# Patient Record
Sex: Male | Born: 1950
Health system: Southern US, Community
[De-identification: ages and names within clinical notes are randomized; demographics above are authoritative.]

## PROBLEM LIST (undated history)

## (undated) DIAGNOSIS — Z125 Encounter for screening for malignant neoplasm of prostate: Secondary | ICD-10-CM

## (undated) DIAGNOSIS — M7989 Other specified soft tissue disorders: Secondary | ICD-10-CM

## (undated) DIAGNOSIS — I358 Other nonrheumatic aortic valve disorders: Secondary | ICD-10-CM

## (undated) DIAGNOSIS — E7800 Pure hypercholesterolemia, unspecified: Secondary | ICD-10-CM

## (undated) DIAGNOSIS — R972 Elevated prostate specific antigen [PSA]: Secondary | ICD-10-CM

## (undated) DIAGNOSIS — I350 Nonrheumatic aortic (valve) stenosis: Secondary | ICD-10-CM

## (undated) DIAGNOSIS — M549 Dorsalgia, unspecified: Secondary | ICD-10-CM

## (undated) DIAGNOSIS — M159 Polyosteoarthritis, unspecified: Secondary | ICD-10-CM

## (undated) DIAGNOSIS — Z9989 Dependence on other enabling machines and devices: Secondary | ICD-10-CM

## (undated) DIAGNOSIS — R011 Cardiac murmur, unspecified: Secondary | ICD-10-CM

## (undated) DIAGNOSIS — K219 Gastro-esophageal reflux disease without esophagitis: Secondary | ICD-10-CM

## (undated) DIAGNOSIS — M51369 Other intervertebral disc degeneration, lumbar region without mention of lumbar back pain or lower extremity pain: Secondary | ICD-10-CM

## (undated) DIAGNOSIS — I10 Essential (primary) hypertension: Secondary | ICD-10-CM

## (undated) DIAGNOSIS — I639 Cerebral infarction, unspecified: Secondary | ICD-10-CM

## (undated) DIAGNOSIS — Z85820 Personal history of malignant melanoma of skin: Secondary | ICD-10-CM

## (undated) DIAGNOSIS — E785 Hyperlipidemia, unspecified: Secondary | ICD-10-CM

## (undated) DIAGNOSIS — C439 Malignant melanoma of skin, unspecified: Secondary | ICD-10-CM

## (undated) DIAGNOSIS — T4145XA Adverse effect of unspecified anesthetic, initial encounter: Secondary | ICD-10-CM

## (undated) DIAGNOSIS — G8929 Other chronic pain: Secondary | ICD-10-CM

## (undated) DIAGNOSIS — M10071 Idiopathic gout, right ankle and foot: Secondary | ICD-10-CM

## (undated) DIAGNOSIS — M109 Gout, unspecified: Secondary | ICD-10-CM

## (undated) DIAGNOSIS — M5136 Other intervertebral disc degeneration, lumbar region: Secondary | ICD-10-CM

## (undated) DIAGNOSIS — T8859XA Other complications of anesthesia, initial encounter: Secondary | ICD-10-CM

## (undated) DIAGNOSIS — M150 Primary generalized (osteo)arthritis: Secondary | ICD-10-CM

## (undated) DIAGNOSIS — M1990 Unspecified osteoarthritis, unspecified site: Secondary | ICD-10-CM

## (undated) HISTORY — PX: MELANOMA EXCISION: SHX5266

## (undated) HISTORY — PX: TONSILLECTOMY: SUR1361

## (undated) HISTORY — DX: Encounter for screening for malignant neoplasm of prostate: Z12.5

## (undated) HISTORY — PX: COLONOSCOPY: SHX174

## (undated) HISTORY — PX: SPINAL CORD STIMULATOR IMPLANT: SHX2422

## (undated) HISTORY — DX: Elevated prostate specific antigen (PSA): R97.20

## (undated) HISTORY — DX: Other intervertebral disc degeneration, lumbar region: M51.36

## (undated) HISTORY — DX: Other chronic pain: G89.29

## (undated) HISTORY — PX: FOOT SURGERY: SHX648

## (undated) HISTORY — DX: Polyosteoarthritis, unspecified: M15.9

## (undated) HISTORY — DX: Nonrheumatic aortic (valve) stenosis: I35.0

## (undated) HISTORY — PX: POSTERIOR LUMBAR FUSION: SHX6036

## (undated) HISTORY — PX: BACK SURGERY: SHX140

## (undated) HISTORY — DX: Pure hypercholesterolemia, unspecified: E78.00

## (undated) HISTORY — DX: Other nonrheumatic aortic valve disorders: I35.8

## (undated) HISTORY — DX: Personal history of malignant melanoma of skin: Z85.820

## (undated) HISTORY — DX: Idiopathic gout, right ankle and foot: M10.071

## (undated) HISTORY — PX: APPENDECTOMY: SHX54

## (undated) HISTORY — DX: Gout, unspecified: M10.9

## (undated) HISTORY — DX: Other intervertebral disc degeneration, lumbar region without mention of lumbar back pain or lower extremity pain: M51.369

## (undated) HISTORY — DX: Primary generalized (osteo)arthritis: M15.0

---

## 1992-08-14 HISTORY — PX: INGUINAL HERNIA REPAIR: SUR1180

## 2001-08-22 ENCOUNTER — Encounter: Payer: Self-pay | Admitting: Family Medicine

## 2001-08-22 ENCOUNTER — Encounter: Admission: RE | Admit: 2001-08-22 | Discharge: 2001-08-22 | Payer: Self-pay | Admitting: Family Medicine

## 2001-09-10 ENCOUNTER — Encounter: Payer: Self-pay | Admitting: Family Medicine

## 2001-09-10 ENCOUNTER — Encounter: Admission: RE | Admit: 2001-09-10 | Discharge: 2001-09-10 | Payer: Self-pay | Admitting: Family Medicine

## 2003-10-02 ENCOUNTER — Encounter: Admission: RE | Admit: 2003-10-02 | Discharge: 2003-10-02 | Payer: Self-pay | Admitting: Family Medicine

## 2003-10-05 ENCOUNTER — Encounter: Admission: RE | Admit: 2003-10-05 | Discharge: 2003-10-05 | Payer: Self-pay | Admitting: Family Medicine

## 2003-10-13 ENCOUNTER — Encounter: Admission: RE | Admit: 2003-10-13 | Discharge: 2003-10-13 | Payer: Self-pay | Admitting: Neurosurgery

## 2003-10-27 ENCOUNTER — Encounter: Admission: RE | Admit: 2003-10-27 | Discharge: 2003-10-27 | Payer: Self-pay | Admitting: Neurosurgery

## 2003-11-16 ENCOUNTER — Encounter: Admission: RE | Admit: 2003-11-16 | Discharge: 2003-11-16 | Payer: Self-pay | Admitting: Neurosurgery

## 2003-11-30 ENCOUNTER — Encounter: Admission: RE | Admit: 2003-11-30 | Discharge: 2003-11-30 | Payer: Self-pay | Admitting: Neurosurgery

## 2003-12-14 ENCOUNTER — Ambulatory Visit (HOSPITAL_COMMUNITY): Admission: RE | Admit: 2003-12-14 | Discharge: 2003-12-15 | Payer: Self-pay | Admitting: Neurosurgery

## 2005-05-31 ENCOUNTER — Encounter: Admission: RE | Admit: 2005-05-31 | Discharge: 2005-05-31 | Payer: Self-pay | Admitting: Neurosurgery

## 2005-09-21 ENCOUNTER — Inpatient Hospital Stay (HOSPITAL_COMMUNITY): Admission: RE | Admit: 2005-09-21 | Discharge: 2005-09-22 | Payer: Self-pay | Admitting: Neurosurgery

## 2006-05-29 ENCOUNTER — Ambulatory Visit: Payer: Self-pay | Admitting: Gastroenterology

## 2006-06-12 ENCOUNTER — Encounter (INDEPENDENT_AMBULATORY_CARE_PROVIDER_SITE_OTHER): Payer: Self-pay | Admitting: Specialist

## 2006-06-12 ENCOUNTER — Ambulatory Visit: Payer: Self-pay | Admitting: Gastroenterology

## 2007-11-06 ENCOUNTER — Encounter: Admission: RE | Admit: 2007-11-06 | Discharge: 2007-11-06 | Payer: Self-pay | Admitting: Neurosurgery

## 2008-01-30 ENCOUNTER — Inpatient Hospital Stay (HOSPITAL_COMMUNITY): Admission: RE | Admit: 2008-01-30 | Discharge: 2008-02-01 | Payer: Self-pay | Admitting: Neurosurgery

## 2008-05-15 ENCOUNTER — Encounter: Admission: RE | Admit: 2008-05-15 | Discharge: 2008-05-15 | Payer: Self-pay | Admitting: Neurosurgery

## 2008-07-16 ENCOUNTER — Encounter: Admission: RE | Admit: 2008-07-16 | Discharge: 2008-07-16 | Payer: Self-pay | Admitting: Neurosurgery

## 2009-02-11 ENCOUNTER — Ambulatory Visit (HOSPITAL_COMMUNITY): Admission: RE | Admit: 2009-02-11 | Discharge: 2009-02-12 | Payer: Self-pay | Admitting: Neurosurgery

## 2009-05-11 ENCOUNTER — Encounter (INDEPENDENT_AMBULATORY_CARE_PROVIDER_SITE_OTHER): Payer: Self-pay | Admitting: *Deleted

## 2009-06-30 ENCOUNTER — Encounter (INDEPENDENT_AMBULATORY_CARE_PROVIDER_SITE_OTHER): Payer: Self-pay | Admitting: *Deleted

## 2009-07-13 ENCOUNTER — Encounter (INDEPENDENT_AMBULATORY_CARE_PROVIDER_SITE_OTHER): Payer: Self-pay | Admitting: *Deleted

## 2009-07-14 ENCOUNTER — Ambulatory Visit: Payer: Self-pay | Admitting: Gastroenterology

## 2009-07-21 ENCOUNTER — Ambulatory Visit: Payer: Self-pay | Admitting: Gastroenterology

## 2009-07-26 ENCOUNTER — Encounter: Payer: Self-pay | Admitting: Gastroenterology

## 2010-10-31 ENCOUNTER — Other Ambulatory Visit: Payer: Self-pay | Admitting: Neurosurgery

## 2010-10-31 DIAGNOSIS — M5410 Radiculopathy, site unspecified: Secondary | ICD-10-CM

## 2010-11-01 ENCOUNTER — Ambulatory Visit
Admission: RE | Admit: 2010-11-01 | Discharge: 2010-11-01 | Disposition: A | Discharge status: Home / Self Care | Payer: BC Managed Care – PPO | Source: Ambulatory Visit | Attending: Neurosurgery | Admitting: Neurosurgery

## 2010-11-01 DIAGNOSIS — M5410 Radiculopathy, site unspecified: Secondary | ICD-10-CM

## 2010-11-21 LAB — CBC
HCT: 42.7 % (ref 39.0–52.0)
Hemoglobin: 14.8 g/dL (ref 13.0–17.0)
MCV: 91 fL (ref 78.0–100.0)
Platelets: 137 10*3/uL — ABNORMAL LOW (ref 150–400)
RDW: 12.7 % (ref 11.5–15.5)

## 2010-11-21 LAB — BASIC METABOLIC PANEL
BUN: 9 mg/dL (ref 6–23)
Chloride: 105 mEq/L (ref 96–112)
Glucose, Bld: 100 mg/dL — ABNORMAL HIGH (ref 70–99)
Potassium: 4.1 mEq/L (ref 3.5–5.1)
Sodium: 143 mEq/L (ref 135–145)

## 2010-12-27 NOTE — Op Note (Signed)
NAME:  Anthony Hebert, Anthony Hebert NO.:  [id number]   MEDICAL RECORD NO.:  [id number]          PATIENT TYPE:  OIB   LOCATION:  3524                         FACILITY:  [facility]   PHYSICIAN:  Reinaldo Meeker, M.D. DATE OF BIRTH:  06-25-1951   DATE OF PROCEDURE:  02/11/2009  DATE OF DISCHARGE:                               OPERATIVE REPORT   PREOPERATIVE DIAGNOSIS:  Failed back syndrome.   POSTOPERATIVE DIAGNOSIS:  Failed back syndrome.   PROCEDURE:  Permanent spinal stimulator implant with implantation of  spinal stimulator battery pack.   SURGEON:  Reinaldo Meeker, MD   PROCEDURE IN DETAIL:  After being placed in the prone position, the  patient's thoracic lumbar region on the left and buttock were shaved,  prepped and draped in the usual sterile fashion.  Localizing x-rays  taken prior to incision to identify the appropriate level.  Midline  incision was made above the spinous processes of T11-T12.  Using Bovie  cutting current, the incision was carried down to the spinous processes.  Subperiosteal dissection was then carried out on the left side of the  spinous processes and lamina, a self-retaining retractor was placed for  exposure.  X-rays showed approach to the appropriate level.  The left  hemilaminectomy of T11 was carried out.  Ligamentum flavum was removed  until the spinal dura was easily visualized.  We then did some trial  passing of the lead until we had an excellent position spanning from the  inferior edge of T9 down across at T10 and in the midline.  At this  time, a small incision was made in the left buttock, large enough to  accommodate the battery pack.  A subcutaneous pouch was then fashioned.  We tested it for size and found be in excellent size.  We then used the  shunt passer, passed it from a the buttock incision to the thoracic  incision without difficulty.  We then passed the lead wires down from  proximal to distal and this was done without  difficulty.  We then placed  the lead after irrigating copiously and found to be in excellent  position.  We attached the lead to the battery pack and tested for  impedance and all readings were excellent.  We then implanted the  battery pack after irrigating the incision once more and attached the  leads to it and secured them in the standard fashion.  Both wounds were  then irrigated copiously.  Gelfoam was placed over the laminotomy at  T11.  The wounds were then both closed with multiple layers of Vicryl on  the muscle fascia, subcutaneous and subcuticular tissues and Dermabond  was placed on the skin at both incisions.  Sterile dressings were then  applied and the patient was extubated and taken to recovery room in  stable condition.           ______________________________  Reinaldo Meeker, M.D.     ROK/MEDQ  D:  02/11/2009  T:  02/11/2009  Job:  [id number]

## 2010-12-27 NOTE — Op Note (Signed)
NAME:  SINA, LUCCHESI NO.:  [id number]   MEDICAL RECORD NO.:  [id number]          PATIENT TYPE:  INP   LOCATION:  3006                         FACILITY:  [facility]   PHYSICIAN:  Reinaldo Meeker, M.D. DATE OF BIRTH:  1951-03-08   DATE OF PROCEDURE:  01/30/2008  DATE OF DISCHARGE:                               OPERATIVE REPORT   PREOPERATIVE DIAGNOSIS:  Spondylolisthesis with stenosis L2-L3, status  post L3-L4 fusion with instrumentation.   POSTOPERATIVE DIAGNOSIS:  Spondylolisthesis with stenosis L2-L3, status  post L3-L4 fusion with instrumentation.   PROCEDURE:  L2-L3 decompressive laminectomy followed by L2-L3 posterior  lumbar interbody fusion with an invasive bony spacer and PEEK interbody  cage, followed by evaluation of L3-L4 fusion and removal of L4 pedicle  screws bilaterally, followed by L2-L3 nonsegmental instrumentation with  pedicle screw fixation and nonsegmental L2-L3 posterolateral fusion.   SECONDARY PROCEDURE:  Decompression of L2 and L3 nerve roots more than  needed for posterior lumbar interbody fusion.   SURGEON:  Reinaldo Meeker, MD.   ASSISTANT:  Tia Alert, MD   PROCEDURE IN DETAIL:  After being placed in the prone position, the  patient's back was shaved, prepped, and draped in usual sterile fashion.  Localizing fluoroscopy was used prior to incision to identify the  appropriate level.  Midline incision was made above the spinous  processes of L1 and L2 and down of the soft tissue above L3 and L4.  Using Bovie cautery, the incision was carried down the spinous processes  of L1 and L2.  Subperiosteal dissection was then carried out bilaterally  on the spinous processes and lamina and facet joint and to the  frontolateral region to identify the transverse processes of L2 and L3.  The pedicle screw heads and rods bilaterally at L3-L4 were identified  and dissected free of soft tissue.  The fusion at L3-L4 was first  evaluated and  found to be very solid.  The top loading nuts were then  removed and the rods removed without difficulty.  The L4 screws were  then removed.  At this time, a complete laminectomy of L2 and removal of  residual lamina of L3 was carried out.  There was marked stenosis with  hypertrophic ligamentum flavum and a free-floating residual lamina of  L3.  This was removed.  This progressively better decompression.  A  final decompression was then carried out about the L2-L3 disk space.  The L2 and L3 nerve roots were both well visualized and decompressed  more than needed for posterior lumbar interbody fusion.  The disk space  was then entered and thoroughly cleaned out with pituitary rongeurs and  curettes.  Great care was taken to avoid injury to the neural elements.  This was successfully done.  At this time, disk space was prepared for  posterior lumbar interbody fusion.  The disk space was distracted up to  10 mm size.  On the opposite side, rotating cutter was carried out  followed by box cutter of 10 x 9 mm sinus.  This was carried down  followed under fluoroscopy.  This was removed.  Residual bony shavings  were removed and then an invasive 10 mm x 9 mm bony spacer was placed  into good position.  In the opposite side rotating cutter was used  followed by scraping of the midline with the Epstein curette.  Autologous bone and OsteoSet Plus were placed in the midline and then a  PEEK interbody spacer was placed on that side.  Fluoroscopy showed both  spacers to be in excellent position.  At this time, pedicle screws were  placed in L2 bilaterally without difficulty.  Entry points were made  with a drill followed by using the pedicle awl tapping with a 5.0 tap  and placing 5.5 x 40 mm screws bilaterally.  Fluoroscopy showed them to  be in excellent position as did palpation of the medial pedicle.  At  this time, large amounts of irrigation was carried out.  Decortication  was carried out of  the transverse process of L2 and L3.  Posterolateral  fusion was performed with autologous bone and OsteoSet Plus.  The rods  were then secured and then the top loading nuts placed and then  tightened.  Compression was carried down from L2 down onto L3.  Final  fluoroscopy in AP and lateral direction showed excellent placement of  the screws, rods, and interbody devices.  Any bleeding, at this time was  controlled with bipolar coagulation and Gelfoam.  The epidural drain was  left in the epidural space and brought in through a separate stab wound  incision.  The wound was then closed in multiple layers of Vicryl in the  muscle, fascia and subcuticular tissues, and staples were placed on the  skin.  A sterile dressing was then applied.  The patient was extubated  and taken to recovery room in stable condition.           ______________________________  Reinaldo Meeker, M.D.     ROK/MEDQ  D:  01/30/2008  T:  01/31/2008  Job:  [id number]

## 2010-12-30 NOTE — Discharge Summary (Signed)
Anthony Hebert, Anthony Hebert NO.:  [id number]   MEDICAL RECORD NO.:  [id number]          PATIENT TYPE:  INP   LOCATION:  3006                         FACILITY:  [facility]   PHYSICIAN:  Reinaldo Meeker, M.D. DATE OF BIRTH:  Nov 05, 1950   DATE OF ADMISSION:  01/30/2008  DATE OF DISCHARGE:  02/01/2008                               DISCHARGE SUMMARY   PRIMARY DIAGNOSIS:  Spondylolisthesis with stenosis L2-3.   PRIMARY OPERATIVE PROCEDURE:  L2-3 posterior lumbar interbody fusion  with removal of L4 pedicle screws.   HISTORY:  [name] is a 60 year old gentleman who has had previous  fusions at L3-4 and L4-5.  Now, he has stenosis and listhesis with  breakdown of the joints at L2-3.  He is admitted at this time for an L2-  3 posterior lumbar interbody fusion with pedicle screw fixation.  The  patient was admitted and taken to the operating room on January 30, 2008,  where he underwent the above-mentioned procedure.  He tolerated the  procedure well and awoke without leg pain.  On subsequent days, the  patient only complained of incisional pain, was able to increase his  activity without difficulty.  By February 01, 2008, the second day postop,  the patient was up ambulating well and tolerating a regular diet.  His  wound is healing well.  He was felt to be discharged home.  Discharge  medications including pain medications.  His condition was remarkably  improved versus admission.           ______________________________  Reinaldo Meeker, M.D.     ROK/MEDQ  D:  03/12/2008  T:  03/13/2008  Job:  [id number]

## 2010-12-30 NOTE — Op Note (Signed)
NAME:  Anthony Hebert, Anthony Hebert                           ACCOUNT NO.:  [id number]   MEDICAL RECORD NO.:  [id number]                   PATIENT TYPE:  OIB   LOCATION:  NA                                   FACILITY:  [facility]   PHYSICIAN:  Reinaldo Meeker, M.D.              DATE OF BIRTH:  01-May-1951   DATE OF PROCEDURE:  12/14/2003  DATE OF DISCHARGE:                                 OPERATIVE REPORT   PREOPERATIVE DIAGNOSIS:  Herniated disk L3-4 central.   POSTOPERATIVE DIAGNOSIS:  Herniated disk L3-4 central.   PROCEDURE:  Bilateral L3-4 intralaminar laminotomies followed by left L3-4  microdiskectomy.   ASSISTANT:  Kathaleen Maser. Pool, M.D.   ANESTHESIA:  General.   DESCRIPTION OF PROCEDURE:  After being placed in the prone position,  patient's back was prepped and draped in the usual sterile fashion.  Localization x-ray was taken prior to the incision in order to identify the  appropriate level.  Midline incision was made about the spinous processes of  L3-4.  Using Bovie cutting current, the incision was carried out to the  spinous processes.  Subperiosteal dissection was then carried out  bilaterally along the spinous processes and lamina.  _____________ self-  retaining retractor was placed for exposure, making sure to approach the  appropriate level.   Starting on the patient's left side, a generous laminotomy was performed by  removing the inferior 1/2 of the L3 lamina and the near 1/3rd of the facet  joint, the superior 1/3rd of the L4 lamina.  Residual bone and ligamentum  flavum were removed in a piecemeal fashion.  Similar decompression was then  carried to the patient's right side.  When bilateral laminotomies had been  completed, microscope was draped, brought into the field and used for the  remainder of the case.  Starting at the patient's left side, microdissection  technique was used to identify the lateral aspect of the thecal sac and L4  nerve root.  Good coagulation was  carried down toward the canal to identify  the L3-4 disk which was markedly herniated.  As the nerve sac was retracted  medially, free fragments of disk material were found in the midline and  these were removed without difficulty.  Annulus was then incised with a #15  blade. Using pituitary rongeurs and curets, thorough disk space clean-out  was carried out.  At the same time, good care was taken to avoid injury to  the neural elements. This was successfully done.   Attention was then turned to the right side, where microdissection technique  was used to identify the disk.  Compression was put upon the disk, but the  annulus was not incised on that side.  Attention was then returned back to  the left side where any disk that had been pushed back down into the disk  space from the right side was cleaned out thoroughly, though there was  not  much there.  At this point, inspection was carried out in all directions for  any evidence of residual compression.  None could be identified.   Large amounts of irrigation were carried out, and any bleeding was  controlled by bipolar coagulation and Gelfoam.  The wound was then closed  using interrupted Vicryl in the muscle, fascia, subcutaneous and  subcuticular tissues and staples on the skin. Sterile dressing was then  applied. The patient was extubated and taken to the recovery room in stable  condition.                                               Reinaldo Meeker, M.D.    ROK/MEDQ  D:  12/14/2003  T:  12/14/2003  Job:  [id number]

## 2010-12-30 NOTE — Op Note (Signed)
NAMELEVANDER, KATZENSTEIN NO.:  [id number]   MEDICAL RECORD NO.:  [id number]          PATIENT TYPE:  INP   LOCATION:  2899                         FACILITY:  [facility]   PHYSICIAN:  Reinaldo Meeker, M.D. DATE OF BIRTH:  Mar 26, 1951   DATE OF PROCEDURE:  09/21/2005  DATE OF DISCHARGE:                                 OPERATIVE REPORT   PREOPERATIVE DIAGNOSIS:  Recurrent stenosis and degenerative disc disease L3-  4.   POSTOPERATIVE DIAGNOSIS:  Recurrent stenosis and degenerative disc disease  L3-4.   PROCEDURE:  Bilateral complete L3-4 decompressive laminectomy followed by  posterior lumbar interbody fusion, followed by pedicle screw fixation and  posterolateral fusion.   SECONDARY PROCEDURE:  Microdissection L3-4 disc as well as L3 and L4 nerve  roots.   SURGEON:  Reinaldo Meeker, M.D.   ASSISTANT:  Tia Alert, M.D.   DESCRIPTION OF PROCEDURE:  After being placed in the prone position, the  patient's back was shaved, prepped and draped in the usual sterile fashion.  Localizing fluoroscopy was used prior to incision to identify the  appropriate level.  Midline incision was made above the spinous processes of  L2, L3 and L4.  Using Bovie cutting current, the incision was carried out in  the spinous processes.  Subperiosteal dissection was then carried out  bilaterally along the spinous processes and lamina facet joint and the far  lateral regions of L3 and L4.  A transverse processes of L3 and L4  bilaterally were also identified.  Self-retaining retractor was placed for  exposure and x-ray showed approach of the appropriate level.  Spinous  processes of L3 and L4 were removed.  Edges of the previous laminotomies  bilaterally were identified.  High speed drill was used to significantly  enlarge the laminotomies to the point where the inferior three quarters of  the L3 lamina, the medial three quarters of the facet joint and the superior  one half of the L4  lamina were all removed along with residual of  ligamentum flavum and scar tissue.  This was done bilaterally to complete  the aggressive bilateral decompression.  At this time, bilateral  microdiscectomy was carried out.  Using microdissection technique, the L3  and L4 nerve roots are identified bilaterally.  Disc was then incised on the  right and cleaned out thoroughly with pituitary rongeurs and curettes.  Thorough procedure was carried on the left side, once again identifying the  L3 and L4 nerve roots and entering the disc space with a 15 blade and  cleaning out vigorously.  The disc space was then prepared for posterior  lumbar interbody fusion.  Sequential distraction was carried out until a 10  mm distractor was in place.  It was felt to be a good fit.  Straightening  was then carried out followed by end plate shaving.  A 10 x 9 x 25 mm plug  was then placed in an insert and rotate fashion, found to be in good  position by fluoroscopy.  Similar procedure was carried out on the opposite  side.  Once again, the  disc space was prepared for interbody fusion.  Prior  to placing the second interbody space, an autologous bone graft and BMP were  placed in the midline.  Second spacer was once again placed by an insert and  rotate technique and found to be in good position.  Pedicle screw fixation  was then carried out.  Under fluoroscopic guidance, small drill  hole entry  point was carried out followed by placing of a pedicle probe.  This was  followed under fluoroscopic guidance.  The __________ was then tapped with a  6 mm tap and 6.5 x 45 mm screws were placed at L3 and L4 bilaterally.  All  screws were found to be in good position under fluoroscopy by direct  palpation of the pedicle.  Posterolateral fusion was then performed by  decorticating the transverse processes of lateral facet joints at L3-4  bilaterally.  Autologous bone graft along with BMP were placed in that  region.  An  appropriate length 50 mm rod was then secured to the pedicle  screws bilaterally.  Prior to locking the second locking mechanism,  compression was carried out.  Final fluoroscopy in AP and lateral direction  showed the bony spacers, screws and rods to all be in good position.  Large  amounts  of irrigation were carried out at this time and any bleeding  controlled with bipolar coagulation and Gelfoam.  A Hemovac drain was left  in the epidural space and brought out through a separate stab wound  incision.  At this time, wound was closed using multiple layers of Vicryl in  the muscle, subcutaneous and subcuticular tissues.  Staples were placed on  the skin.  A sterile dressing was then applied and the patient was extubated  and taken to the recovery room in stable condition.           ______________________________  Reinaldo Meeker, M.D.     ROK/MEDQ  D:  09/21/2005  T:  09/21/2005  Job:  [id number]

## 2011-01-11 ENCOUNTER — Ambulatory Visit
Admission: RE | Admit: 2011-01-11 | Discharge: 2011-01-11 | Disposition: A | Discharge status: Home / Self Care | Payer: BC Managed Care – PPO | Source: Ambulatory Visit | Attending: Neurosurgery | Admitting: Neurosurgery

## 2011-01-11 ENCOUNTER — Other Ambulatory Visit: Payer: Self-pay | Admitting: Neurosurgery

## 2011-01-11 DIAGNOSIS — M549 Dorsalgia, unspecified: Secondary | ICD-10-CM

## 2011-03-15 ENCOUNTER — Encounter (HOSPITAL_COMMUNITY)
Admission: RE | Admit: 2011-03-15 | Discharge: 2011-03-15 | Disposition: A | Discharge status: Home / Self Care | Payer: BC Managed Care – PPO | Source: Ambulatory Visit | Attending: Neurosurgery | Admitting: Neurosurgery

## 2011-03-15 ENCOUNTER — Other Ambulatory Visit (HOSPITAL_COMMUNITY): Payer: Self-pay | Admitting: Neurosurgery

## 2011-03-15 DIAGNOSIS — M48061 Spinal stenosis, lumbar region without neurogenic claudication: Secondary | ICD-10-CM

## 2011-03-15 LAB — BASIC METABOLIC PANEL
BUN: 12 mg/dL (ref 6–23)
Chloride: 101 mEq/L (ref 96–112)
Creatinine, Ser: 1.06 mg/dL (ref 0.50–1.35)
GFR calc Af Amer: >60 mL/min (ref >60)
Glucose, Bld: 109 mg/dL — ABNORMAL HIGH (ref 70–99)
Potassium: 4 mEq/L (ref 3.5–5.1)

## 2011-03-15 LAB — CBC
HCT: 39.8 % (ref 39.0–52.0)
Hemoglobin: 14.1 g/dL (ref 13.0–17.0)
MCH: 31.7 pg (ref 26.0–34.0)
MCHC: 35.4 g/dL (ref 30.0–36.0)
RDW: 12.1 % (ref 11.5–15.5)

## 2011-03-15 LAB — TYPE AND SCREEN
ABO/RH(D): O NEG
Antibody Screen: NEGATIVE

## 2011-03-21 ENCOUNTER — Inpatient Hospital Stay (HOSPITAL_COMMUNITY): Payer: BC Managed Care – PPO

## 2011-03-21 ENCOUNTER — Inpatient Hospital Stay (HOSPITAL_COMMUNITY)
Admission: RE | Admit: 2011-03-21 | Discharge: 2011-03-28 | DRG: 756 | Disposition: A | Discharge status: Rehabilitation Facility | Payer: BC Managed Care – PPO | Source: Ambulatory Visit | Attending: Neurosurgery | Admitting: Neurosurgery

## 2011-03-21 DIAGNOSIS — M51379 Other intervertebral disc degeneration, lumbosacral region without mention of lumbar back pain or lower extremity pain: Secondary | ICD-10-CM

## 2011-03-21 DIAGNOSIS — Z01812 Encounter for preprocedural laboratory examination: Secondary | ICD-10-CM

## 2011-03-21 DIAGNOSIS — M5137 Other intervertebral disc degeneration, lumbosacral region: Secondary | ICD-10-CM

## 2011-03-21 DIAGNOSIS — J9600 Acute respiratory failure, unspecified whether with hypoxia or hypercapnia: Secondary | ICD-10-CM

## 2011-03-21 DIAGNOSIS — M48061 Spinal stenosis, lumbar region without neurogenic claudication: Principal | ICD-10-CM | POA: Diagnosis present

## 2011-03-21 LAB — BASIC METABOLIC PANEL
BUN: 14 mg/dL (ref 6–23)
GFR calc Af Amer: >60 mL/min (ref >60)
GFR calc non Af Amer: >60 mL/min (ref >60)
Potassium: 4.3 mEq/L (ref 3.5–5.1)

## 2011-03-21 LAB — BLOOD GAS, ARTERIAL
Acid-Base Excess: 0.2 mmol/L (ref 0.0–2.0)
Bicarbonate: 24.8 mEq/L — ABNORMAL HIGH (ref 20.0–24.0)
FIO2: 0.4 %
O2 Saturation: 97.6 %
PEEP: 5 cmH2O
PEEP: 5 cmH2O
Pressure support: 5 cmH2O
RATE: 8 resp/min
TCO2: 26.1 mmol/L (ref 0–100)
pCO2 arterial: 45.5 mmHg — ABNORMAL HIGH (ref 35.0–45.0)
pH, Arterial: 7.359 (ref 7.350–7.450)
pH, Arterial: 7.386 (ref 7.350–7.450)
pO2, Arterial: 92.9 mmHg (ref 80.0–100.0)
pO2, Arterial: 95.3 mmHg (ref 80.0–100.0)

## 2011-03-21 LAB — DIFFERENTIAL
Lymphocytes Relative: 9 % — ABNORMAL LOW (ref 12–46)
Lymphs Abs: 0.6 10*3/uL — ABNORMAL LOW (ref 0.7–4.0)
Monocytes Absolute: 0.1 10*3/uL (ref 0.1–1.0)
Monocytes Relative: 1 % — ABNORMAL LOW (ref 3–12)
Neutro Abs: 6 10*3/uL (ref 1.7–7.7)
Neutrophils Relative %: 90 % — ABNORMAL HIGH (ref 43–77)

## 2011-03-21 LAB — CBC
HCT: 33.6 % — ABNORMAL LOW (ref 39.0–52.0)
Hemoglobin: 11.9 g/dL — ABNORMAL LOW (ref 13.0–17.0)
MCH: 31.3 pg (ref 26.0–34.0)
MCHC: 35.4 g/dL (ref 30.0–36.0)
MCV: 88.4 fL (ref 78.0–100.0)
RBC: 3.8 MIL/uL — ABNORMAL LOW (ref 4.22–5.81)

## 2011-03-21 LAB — GLUCOSE, CAPILLARY: Glucose-Capillary: 165 mg/dL — ABNORMAL HIGH (ref 70–99)

## 2011-03-22 DIAGNOSIS — J9600 Acute respiratory failure, unspecified whether with hypoxia or hypercapnia: Secondary | ICD-10-CM

## 2011-03-22 DIAGNOSIS — R52 Pain, unspecified: Secondary | ICD-10-CM

## 2011-03-22 LAB — BASIC METABOLIC PANEL
Chloride: 103 mEq/L (ref 96–112)
GFR calc Af Amer: >60 mL/min (ref >60)
GFR calc non Af Amer: >60 mL/min (ref >60)
Potassium: 4.8 mEq/L (ref 3.5–5.1)
Sodium: 137 mEq/L (ref 135–145)

## 2011-03-22 LAB — GLUCOSE, CAPILLARY
Glucose-Capillary: 174 mg/dL — ABNORMAL HIGH (ref 70–99)
Glucose-Capillary: 137 mg/dL — ABNORMAL HIGH (ref 70–99)

## 2011-03-23 DIAGNOSIS — IMO0002 Reserved for concepts with insufficient information to code with codable children: Secondary | ICD-10-CM

## 2011-03-23 DIAGNOSIS — J9600 Acute respiratory failure, unspecified whether with hypoxia or hypercapnia: Secondary | ICD-10-CM

## 2011-03-23 DIAGNOSIS — M47817 Spondylosis without myelopathy or radiculopathy, lumbosacral region: Secondary | ICD-10-CM

## 2011-03-23 LAB — GLUCOSE, CAPILLARY
Glucose-Capillary: 158 mg/dL — ABNORMAL HIGH (ref 70–99)
Glucose-Capillary: 146 mg/dL — ABNORMAL HIGH (ref 70–99)
Glucose-Capillary: 147 mg/dL — ABNORMAL HIGH (ref 70–99)

## 2011-03-24 LAB — GLUCOSE, CAPILLARY
Glucose-Capillary: 146 mg/dL — ABNORMAL HIGH (ref 70–99)
Glucose-Capillary: 192 mg/dL — ABNORMAL HIGH (ref 70–99)

## 2011-03-25 LAB — GLUCOSE, CAPILLARY
Glucose-Capillary: 214 mg/dL — ABNORMAL HIGH (ref 70–99)
Glucose-Capillary: 135 mg/dL — ABNORMAL HIGH (ref 70–99)

## 2011-03-25 LAB — CBC
MCV: 86.9 fL (ref 78.0–100.0)
Platelets: 131 10*3/uL — ABNORMAL LOW (ref 150–400)
RBC: 3.96 MIL/uL — ABNORMAL LOW (ref 4.22–5.81)
RDW: 11.9 % (ref 11.5–15.5)
WBC: 12.4 10*3/uL — ABNORMAL HIGH (ref 4.0–10.5)

## 2011-03-26 LAB — GLUCOSE, CAPILLARY
Glucose-Capillary: 144 mg/dL — ABNORMAL HIGH (ref 70–99)
Glucose-Capillary: 134 mg/dL — ABNORMAL HIGH (ref 70–99)
Glucose-Capillary: 171 mg/dL — ABNORMAL HIGH (ref 70–99)

## 2011-03-27 LAB — GLUCOSE, CAPILLARY
Glucose-Capillary: 138 mg/dL — ABNORMAL HIGH (ref 70–99)
Glucose-Capillary: 99 mg/dL (ref 70–99)
Glucose-Capillary: 120 mg/dL — ABNORMAL HIGH (ref 70–99)

## 2011-03-28 ENCOUNTER — Inpatient Hospital Stay (HOSPITAL_COMMUNITY)
Admission: RE | Admit: 2011-03-28 | Discharge: 2011-04-15 | DRG: 462 | Disposition: A | Discharge status: Home Health | Payer: BC Managed Care – PPO | Source: Other Acute Inpatient Hospital | Attending: Physical Medicine & Rehabilitation | Admitting: Physical Medicine & Rehabilitation

## 2011-03-28 DIAGNOSIS — M4716 Other spondylosis with myelopathy, lumbar region: Secondary | ICD-10-CM

## 2011-03-28 DIAGNOSIS — J069 Acute upper respiratory infection, unspecified: Secondary | ICD-10-CM

## 2011-03-28 DIAGNOSIS — K5900 Constipation, unspecified: Secondary | ICD-10-CM

## 2011-03-28 DIAGNOSIS — E785 Hyperlipidemia, unspecified: Secondary | ICD-10-CM

## 2011-03-28 DIAGNOSIS — I10 Essential (primary) hypertension: Secondary | ICD-10-CM

## 2011-03-28 DIAGNOSIS — M48061 Spinal stenosis, lumbar region without neurogenic claudication: Secondary | ICD-10-CM

## 2011-03-28 DIAGNOSIS — Q762 Congenital spondylolisthesis: Secondary | ICD-10-CM

## 2011-03-28 DIAGNOSIS — Z8249 Family history of ischemic heart disease and other diseases of the circulatory system: Secondary | ICD-10-CM

## 2011-03-28 DIAGNOSIS — IMO0002 Reserved for concepts with insufficient information to code with codable children: Secondary | ICD-10-CM

## 2011-03-28 DIAGNOSIS — N319 Neuromuscular dysfunction of bladder, unspecified: Secondary | ICD-10-CM

## 2011-03-28 DIAGNOSIS — Z5189 Encounter for other specified aftercare: Principal | ICD-10-CM

## 2011-03-28 LAB — GLUCOSE, CAPILLARY: Glucose-Capillary: 98 mg/dL (ref 70–99)

## 2011-03-29 DIAGNOSIS — M4716 Other spondylosis with myelopathy, lumbar region: Secondary | ICD-10-CM

## 2011-03-29 DIAGNOSIS — IMO0002 Reserved for concepts with insufficient information to code with codable children: Secondary | ICD-10-CM

## 2011-03-29 DIAGNOSIS — I10 Essential (primary) hypertension: Secondary | ICD-10-CM

## 2011-03-29 DIAGNOSIS — M7989 Other specified soft tissue disorders: Secondary | ICD-10-CM

## 2011-03-29 LAB — COMPREHENSIVE METABOLIC PANEL
BUN: 30 mg/dL — ABNORMAL HIGH (ref 6–23)
CO2: 30 mEq/L (ref 19–32)
Calcium: 8.9 mg/dL (ref 8.4–10.5)
Creatinine, Ser: 0.83 mg/dL (ref 0.50–1.35)
GFR calc Af Amer: >60 mL/min (ref >60)
GFR calc non Af Amer: >60 mL/min (ref >60)
Glucose, Bld: 102 mg/dL — ABNORMAL HIGH (ref 70–99)
Total Protein: 6.4 g/dL (ref 6.0–8.3)

## 2011-03-29 LAB — DIFFERENTIAL
Basophils Absolute: 0.1 10*3/uL (ref 0.0–0.1)
Basophils Relative: 1 % (ref 0–1)
Eosinophils Relative: 1 % (ref 0–5)
Lymphs Abs: 2 10*3/uL (ref 0.7–4.0)
Monocytes Relative: 8 % (ref 3–12)
Neutro Abs: 11 10*3/uL — ABNORMAL HIGH (ref 1.7–7.7)

## 2011-03-29 LAB — CBC
HCT: 41.9 % (ref 39.0–52.0)
Hemoglobin: 15.4 g/dL (ref 13.0–17.0)
MCH: 31.4 pg (ref 26.0–34.0)
MCHC: 36.8 g/dL — ABNORMAL HIGH (ref 30.0–36.0)
MCV: 85.5 fL (ref 78.0–100.0)

## 2011-04-07 DIAGNOSIS — IMO0002 Reserved for concepts with insufficient information to code with codable children: Secondary | ICD-10-CM

## 2011-04-07 DIAGNOSIS — M4716 Other spondylosis with myelopathy, lumbar region: Secondary | ICD-10-CM

## 2011-04-07 DIAGNOSIS — I10 Essential (primary) hypertension: Secondary | ICD-10-CM

## 2011-04-11 DIAGNOSIS — IMO0002 Reserved for concepts with insufficient information to code with codable children: Secondary | ICD-10-CM

## 2011-04-11 DIAGNOSIS — I10 Essential (primary) hypertension: Secondary | ICD-10-CM

## 2011-04-11 DIAGNOSIS — M4716 Other spondylosis with myelopathy, lumbar region: Secondary | ICD-10-CM

## 2011-04-11 NOTE — H&P (Signed)
Anthony Hebert, FOK NO.:  [id number]  MEDICAL RECORD NO.:  [id number]  LOCATION:  4007                         FACILITY:  [facility]  PHYSICIAN:  Ranelle Oyster, M.D.DATE OF BIRTH:  [birth year]  DATE OF ADMISSION:  03/28/2011 DATE OF DISCHARGE:                             HISTORY & PHYSICAL   CHIEF COMPLAINT:  Back pain and leg weakness.  SURGEON:  Reinaldo Meeker, MD  HISTORY OF PRESENT ILLNESS:  This is a 60 year old white male with history of multiple back surgeries as well as permanent spinal stimulator placed in 2010, was admitted on March 21, 2011, with increasing low back pain, radiating to the both legs.  X-rays showed spinal stenosis and retrolisthesis at L1-L2.  He underwent L1-L2 decompressive laminectomy and TLIF on March 21, 2011 by Dr. Gerlene Fee. Postoperatively, he had increased proximal weakness in the legs with decreased sensation.  He was returned to the OR the same day for reexploration with no acute changes found.  He was on low-dose Decadron. The patient did remain intubated 24 hours postoperatively.  He has had persistent problems with leg weakness and numbness, right more than left.  We were asked to see this patient, performed a consult on March 23, 2011, and felt he could potentially benefit from an inpatient rehab stay.  REVIEW OF SYSTEMS:  Notable for weakness, numbness, low back pain. Denies bladder issues, although he is quite constipated.  He is sleeping fairly well.  Full 12-point review is in the written H and P.  PAST MEDICAL HISTORY:  Positive for hypertension, hyperlipidemia, back surgery in 2005, 2007, and 2009 with permanent bone stimulator placed in July 2010, hernia repair, right foot surgery.  He occasionally drinks. He does not smoke.  FAMILY HISTORY:  Positive for CAD.  SOCIAL HISTORY:  The patient is divorced, lives alone.  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  ALLERGIES:  None.  HOME MEDICATIONS:  Lisinopril/HCTZ, Zocor, hydrocodone.  LABS:  Hemoglobin 12.3, white count 12, platelets 131.  Sodium 137, potassium 4.8.  PHYSICAL EXAMINATION:  VITAL SIGNS:  Blood pressure 118/70, pulse 74, respiratory rate 18, temperature 98. GENERAL:  The patient is generally pleasant, alert, sitting in bed. HEENT:  Pupils equal, round, and reactive to light.  Ear, nose, and throat exam notable for intact dentition.  Pink moist mucosa. NECK:  Supple without JVD or lymphadenopathy. CHEST:  Clear to auscultation bilaterally without wheezes, rales, or rhonchi. HEART:  Regular rate and rhythm without murmurs, rubs, or gallops. ABDOMEN:  Soft, nontender.  Bowel sounds are positive. SKIN:  Generally intact.  The surgical site which was clean and well approximated. NEUROLOGIC:  Cranial nerves II-XII were normal.  Reflexes 1+.  Sensation is diminished throughout the right lower extremity, particularly along the L3-L4 dermatomes.  He has some numbness even down to L4, L5, S1 to a lesser extent.  Left lower extremity was essentially normal for sensation.  Strength is 5/5 in both upper extremities.  He is 1+ to 2 right hip flexors, 1+ right quads, 4/5 tib anterior and gastrocs.  Left lower extremity is 2 to 2+ proximally, 4/5 distally.  Reflexes  are diminished in both lower extremities.  Judgment, orientation, memory, and mood are all appropriate.  POST ADMISSION PHYSICIAN EVALUATION: 1. Functional deficits secondary to lumbar stenosis with     retrolisthesis and radiculopathy at L1-L2 status post decompression     with postoperative weakness, particularly in the L1, L2, and L3     levels, right greater than left.  The patient is status post     laminectomy and TLIF on March 21, 2011. 2. The patient was admitted to receive collaborative interdisciplinary     care between the physiatrist, rehab nursing staff, and therapy     team. 3. The patient's  level of medical complexity and substantial therapy     needs in context of that medical necessity cannot be provided at a     lesser intensity of care. 4. The patient has experienced substantial functional loss from his     baseline.  Premorbidly, he is independent, albeit in pain.     Currently, he is min assist bed mobility, +2 total assist with     transfer 60%.  Gait has not been tested.  He is total assist lower     body ADLs.  Judging by the patient's diagnosis, physical exam, and     functional history, he has potential for functional progress which     will result in measurable gains while in inpatient rehab.  These     gains will be of substantial and practical use upon discharge home     in facilitating mobility and self-care.  Interim changes in his     status since our consult are detailed above. 5. The physiatrist will provide 24-hour management of medical needs as     well as oversight of the therapy plan/treatment and provide     guidance as appropriate regarding interaction the two.  Medical     problem list and plan are below. 6. A 24-hour rehab nursing team will assist in management of the     patient's skin care needs as well as bowel and bladder function,     safety awareness, integration of therapy concepts and techniques. 7. PT will assess and treat for lower extremity strength, range of     motion, functional mobility, adaptive techniques, equipment, lower     extremity strength, neuromuscular education with goals modified     independent to occasional min assist.  He will have wheelchair and     perhaps short distance ambulation goals depending on recovery in     his proximal leg musculature.  I would expect some improvement. 8. OT will assess and treat for upper extremity strength, ADLs,     adaptive techniques, equipment, functional mobility, safety with     goals overall modified independent to min assist. 9. Case manager and social worker will assess and  treat for     psychosocial issues and discharge planning. 10.Team conference will be held weekly to assess progress towards     goals and determine barriers to discharge. 11.The patient demonstrated sufficient medical stability and exercise     capacity to tolerate at least 3 hours therapy per day at least 5     days per week. 12.Estimated length of stay is 2-2-1/2 weeks.  Prognosis is good.  The     patient is quite motivated.  MEDICAL PROBLEM LIST AND PLAN: 1. Deep vein thrombosis prophylaxis, subcu Lovenox.  We will check     screening lower extremity venous Dopplers on admission given his  weakness and deep vein thrombosis risk. 2. Pain management with p.r.n. Vicodin, Robaxin.  Consider scheduling     pain medication depending on needs and pain with activities. 3. Blood pressure control:  HCTZ on board 12.5 mg daily.  Blood     pressure normotensive today.  The patient had been on lisinopril     previously, consider resuming if the blood pressure begins to     increase. 4. Wound care:  Continue with dry dressing to pack and observe     clinically for now.  Avoid excessive pressure and shearing injuries     to the area. 5. Bowel and bladder:  The patient with some constipation.  This is     likely multifactorial.  I will push bowel regimen and encourage     regular movements at least every other day.  The patient is voiding     on his own, but will check PVRs and follow for continence and     pattern.     Ranelle Oyster, M.D.     ZTS/MEDQ  D:  03/28/2011  T:  03/29/2011  Job:  [id number]  cc:   Reinaldo Meeker, M.D.  Electronically Signed by Faith Rogue M.D. on 04/11/2011 06:02:11 PM

## 2011-04-14 DIAGNOSIS — I10 Essential (primary) hypertension: Secondary | ICD-10-CM

## 2011-04-14 DIAGNOSIS — M4716 Other spondylosis with myelopathy, lumbar region: Secondary | ICD-10-CM

## 2011-04-14 DIAGNOSIS — IMO0002 Reserved for concepts with insufficient information to code with codable children: Secondary | ICD-10-CM

## 2011-04-15 DIAGNOSIS — I10 Essential (primary) hypertension: Secondary | ICD-10-CM

## 2011-04-15 DIAGNOSIS — IMO0002 Reserved for concepts with insufficient information to code with codable children: Secondary | ICD-10-CM

## 2011-04-15 DIAGNOSIS — M4716 Other spondylosis with myelopathy, lumbar region: Secondary | ICD-10-CM

## 2011-05-04 NOTE — Discharge Summary (Signed)
NAMECORRION, STIREWALT NO.:  [id number]  MEDICAL RECORD NO.:  [id number]  LOCATION:  4007                         FACILITY:  [facility]  PHYSICIAN:  Ranelle Oyster, M.D.DATE OF BIRTH:  09-24-1950  DATE OF ADMISSION:  03/28/2011 DATE OF DISCHARGE:                              DISCHARGE SUMMARY   DISCHARGE DIAGNOSES:  Lumbar stenosis with radiculopathy, status post lumbar L1-2 decompression laminectomy with transforaminal lumbar interbody fusion March 21, 2011.  Subcutaneous Lovenox for deep vein thrombosis prophylaxis pain management.  Multiple back surgeries in the past with spinal stimulator 710, hypertension, hyperlipidemia.  A 60 year old white male with history of multiple back surgeries as well as permanent spinal stimulator admitted March 21, 2011, with increased low back pain radiating to lower extremities.  X-rays and imaging showed spinal stenosis with a retrolisthesis lumbar L1-2.  Underwent lumbar L1- 2 decompressive laminectomy TLIF March 21, 2011, per Dr. Gerlene Fee. Postoperative increased proximal weakness lower extremities and decreased sensation.  Return to the operating room same day for re- exploration without acute changes.  No hematoma noted.  Placed on low- dose Decadron therapy.  He remained intubated for 24 hours postoperatively.  Placed on subcutaneous Lovenox for deep vein thrombosis prophylaxis on March 24, 2011.  He was total assist for mobility.  He was admitted for comprehensive rehab program.  PAST MEDICAL HISTORY:  See discharge diagnoses.  No tobacco.  Occasional alcohol.  ALLERGIES:  None.  SOCIAL HISTORY:  Divorced, lives alone.  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  FUNCTIONAL HISTORY:  Prior to admission was independent.  Functional status upon admission to Rehab Services was minimal assist bed mobility plus to total assist transfers, ambulation was not yet tested,  total assist for lower body activities daily living.  MEDICATIONS PRIOR TO ADMISSION: 1. Lisinopril with hydrochlorothiazide 20-12.5 mg daily. 2. Zocor 40 mg daily. 3. Hydrocodone as needed.  PHYSICAL EXAMINATION:  VITAL SIGNS:  Blood pressure 118/70, pulse 74, temperature 98 and respirations 18. GENERAL:  This was an alert male, in no acute distress, oriented x3. Deep tendon reflexes 2+, decreased sensation, light touch distally to the lower extremities. BACK:  Incision with staples intact.  No drainage.  Calves were cool without any swelling, erythema, nontender. LUNGS:  Clear to auscultation. CARDIAC:  Regular rate and rhythm. ABDOMEN:  Soft and nontender.  Good bowel sounds.  REHABILITATION HOSPITAL COURSE:  The patient was admitted to Inpatient Rehab Services with therapies initiated on a 3-hour daily basis consisting of physical therapy, occupational therapy and rehabilitation nursing. The following issues were addressed during the patient's rehabilitation stay.  Pertaining to Mr. Pattillo lumbar stenosis with radiculopathy, he had undergone decompressive laminectomy TLIF on March 21, 2011, back brace when out of bed.  Surgical site healing nicely.  He would follow up Dr. Aliene Beams.  He remained on subcutaneous Lovenox for deep vein thrombosis prophylaxis throughout his rehab course. Venous Doppler studies of lower extremities were negative.  Pain management with the use of Vicodin and Robaxin with good results.  Noted history of multiple back surgeries in the past with a spinal stimulator in place since February 21, 2011.  His blood pressures were well-controlled on hydrochlorothiazide with no orthostatic changes noted.  He was slowly tapered off of his Decadron therapy.  The patient received weekly collaborative interdisciplinary team conferences to discuss estimated length of stay, family teaching and any barriers to his discharge.  He was supervision for activities of  daily living, sit to stand, modified independent wheelchair, supervision minimal assist for ambulation.  His strength and endurance had greatly improved.  He was continent of bowel and bladder other than some mild constipation that was resolved with laxative assistance.  He had been fitted with a KAFO - AFO brace to aid his overall mobility.  Home health therapies had been arranged.  LATEST LABORATORY DATA:  Hemoglobin 15.4, hematocrit 41.9, platelet 156,000, WBC of 14.  Sodium 134, potassium 4.1, BUN 30, creatinine 0.8.  DISCHARGE MEDICATIONS: 1. Hydrochlorothiazide 12.5 mg daily. 2. Zocor 40 mg daily. 3. Claritin 10 mg daily. 4. Robaxin 500 mg every 6 h. as needed muscle spasms, dispense of 90     tablets. 5. Vicodin 5-325 one or two tablets every 4 h. as needed pain,     dispense 90 tablets.  DIET:  Regular.  SPECIAL INSTRUCTIONS:  Back brace when out of bed.  No driving.  The patient should follow up with Dr. Aliene Beams, call for appointment Dr. Faith Rogue at the Outpatient Rehab Service office as needed.     Mariam Dollar, P.A.   ______________________________ Ranelle Oyster, M.D.DA/MEDQ  D:  04/13/2011  T:  04/13/2011  Job:  [id number]  cc:   Ranelle Oyster, M.D. Joycelyn Rua, M.D. Reinaldo Meeker, M.D.  Electronically Signed by Mariam Dollar P.A. on 04/13/2011 02:26:31 PM Electronically Signed by Faith Rogue M.D. on 05/04/2011 09:55:29 AM

## 2011-05-09 ENCOUNTER — Ambulatory Visit
Admission: RE | Admit: 2011-05-09 | Discharge: 2011-05-09 | Disposition: A | Discharge status: Home / Self Care | Payer: BC Managed Care – PPO | Source: Ambulatory Visit | Attending: Neurosurgery | Admitting: Neurosurgery

## 2011-05-09 ENCOUNTER — Other Ambulatory Visit: Payer: Self-pay | Admitting: Neurosurgery

## 2011-05-09 DIAGNOSIS — M48061 Spinal stenosis, lumbar region without neurogenic claudication: Secondary | ICD-10-CM

## 2011-05-09 DIAGNOSIS — M533 Sacrococcygeal disorders, not elsewhere classified: Secondary | ICD-10-CM

## 2011-05-11 LAB — CBC
HCT: 44
MCV: 90.7
Platelets: 155
RBC: 4.85
WBC: 6.8

## 2011-05-11 LAB — BASIC METABOLIC PANEL
Chloride: 102
GFR calc Af Amer: >60
GFR calc non Af Amer: >60
Potassium: 4.9

## 2011-05-11 LAB — TYPE AND SCREEN: ABO/RH(D): O NEG

## 2011-05-15 ENCOUNTER — Encounter
Payer: BC Managed Care – PPO | Attending: Physical Medicine & Rehabilitation | Admitting: Physical Medicine & Rehabilitation

## 2011-05-15 DIAGNOSIS — M48061 Spinal stenosis, lumbar region without neurogenic claudication: Secondary | ICD-10-CM | POA: Insufficient documentation

## 2011-05-15 DIAGNOSIS — R209 Unspecified disturbances of skin sensation: Secondary | ICD-10-CM | POA: Insufficient documentation

## 2011-05-15 DIAGNOSIS — Z981 Arthrodesis status: Secondary | ICD-10-CM | POA: Insufficient documentation

## 2011-05-15 DIAGNOSIS — M4716 Other spondylosis with myelopathy, lumbar region: Secondary | ICD-10-CM

## 2011-05-15 DIAGNOSIS — IMO0002 Reserved for concepts with insufficient information to code with codable children: Secondary | ICD-10-CM

## 2011-05-15 NOTE — Assessment & Plan Note (Signed)
Anthony Hebert is back regarding his lumbar radiculopathy and myelopathy. He was discharged from rehab at the end of August and has since been home and now is receiving outpatient therapy.  He is doing quite well and wearing his knee brace when he is walking longer distances, however, therapies working with gait without the brace.  The patient had questions about modifying his current brace to allow him more flexibility and perhaps looking at just a simple knee brace revision. He is denying pain.  He does have occasional hip and back tightness in the morning when he wakes up, however, the pain is minimal.  He has turned his spinal stimulator back on.  He rates his pain at 1/10.  REVIEW OF SYSTEMS:  Notable for the above.  He has some numbness still on the right leg predominately.  Sugars have been high at times.  Full 12-point review is in the written health and history section of the chart.  SOCIAL HISTORY:  Unchanged.  The patient is divorced, living alone.  He works as a Geneticist, molecular.  PHYSICAL EXAMINATION:  VITAL SIGNS:  Blood pressure is 119/63, pulse is 74, respiratory rate 18 and he is satting 98% on room air. GENERAL:  The patient is pleasant, alert and oriented x3.  Affect is generally bright and appropriate. EXTREMITIES:  The patient's strength is improving quite a bit.  He is near 4/5 ankle dorsiflexion, plantar flexion, knee flexion and extension, this is 4/5.  Hip flexion still is 1+ at best out of 5. Extension is a bit better at 3/5.  He ambulated for me today without the brace and actually did fairly nicely.  He does have a little bit of knee hyperextension and stance and tends to sit on the hip a little bit when he walks, but he is under fairly good control.  He is stable with his brace as well and his gait actually is less fluid because of the rigidity of the brace.  Sensation is a bit diminished at 1/2 in the right lower extremity.  Left lower extremity strength is near 4  out of 5/5 with really normal sensory findings noted.  BACK:  Clean and intact. NEUROLOGIC:  Cognitively, he is alert and appropriate. HEART:  Regular. CHEST:  Clear. ABDOMEN:  Soft and nontender.  ASSESSMENT:  Lumbar stenosis with radiculopathy, status post L1-L2 decompressive laminectomy and fusion.  PLAN: 1. The patient is doing extremely well at this point.  I think it     might be useful to fit the patient with a simple neoprene knee     brace with hinges to support the knee in walking short distances.     I think he should use the knee ankle-foot orthosis for going longer     distances.  Before too long he will be able to come off the knee     ankle-foot orthosis entirely.  I predict over the next month or so     he should get to that level.  He will continue with his therapy to     work on strengthening.  He may benefit from some aquatic activity     as well.  I wrote him a prescription for a simple knee brace today. 2. I will see him back here in about 3 months.  He will call me with     any problems or questions, otherwise.     Ranelle Oyster, M.D. Electronically Signed    ZTS/MedQ D:  05/15/2011 11:58:49  T:  05/15/2011 21:27:33  Job #:  [id number]

## 2011-05-15 NOTE — Op Note (Signed)
[name], Hebert NO.:  [id number]  MEDICAL RECORD NO.:  [id number]  LOCATION:  3105                         FACILITY:  [facility]  PHYSICIAN:  Reinaldo Meeker, M.D. DATE OF BIRTH:  1950-08-22  DATE OF PROCEDURE:  03/21/2011 DATE OF DISCHARGE:                              OPERATIVE REPORT   PREOPERATIVE DIAGNOSES:  Adjacent level disease with spinal stenosis and retrolisthesis L1-2.  POSTOPERATIVE DIAGNOSIS:  Adjacent level disease with spinal stenosis and retrolisthesis L1-2.  PROCEDURE:  Right L1-2 decompressive laminotomy for decompression of L1 and L2 nerve roots more so than needed for transverse lumbar interbody fusion followed by L1-2 transverse lumbar interbody fusion with PEEK interbody spacer followed by evaluation of fusion at L2-3 and extension of pedicle screw fixation L1-2 on the right with encompassed pedicle screw system followed by L1-2 posterolateral fusion.  SURGEON:  Reinaldo Meeker, MD  SURGICAL INDICATIONS:  Anthony Hebert is a 60 year old gentleman with history of many back procedures including fusions at L4-5, L3-4, and L2- 3.  He tolerated the procedures well each time and got an excellent improvement, but over subsequent years he would always tend to get breakdown extension of his disease to adjacent levels.  He has now developed a retrolisthesis at L1-2 with marked stenosis and near complete block of his myelogram.  His right leg pain was inside of the greater pathology.  After discussing the options, he has elected to proceed the right-sided decompression with transverse lumbar interbody fusion, extension of his pedicle screw fixation on the right at L1-2. We had a long discussion with Anthony Hebert regarding all the risks and benefits of surgical intervention.  Risks discussed include, but are limited to bleeding, infection, weakness, numbness, paralysis, spinal fluid leak, trouble with instrumentation, nonunion, coma, and death  with the possibility of increased pain or not improvement.  We discussed alternative methods of therapy all risks and benefits of nonintervention.  Anthony Hebert had the opportunity to ask numerous questions and appears to understand.  With that information in hand, he has requested to proceed with surgery, and he is admitted at this time for his procedure.  PROCEDURE IN DETAIL:  After placed in the prone position, the patient's back was prepped and draped in the usual sterile fashion.  Localizing fluoroscopy was used prior to incision to identify the appropriate level.  Midline incision was made above the spinous processes of L1 and down into soft tissue along the right side at L2-3 to identify his previous pedicle screw instrumentation, L2-3.  This was identified and dissected free of soft tissue, and then a subperiosteal dissection was then carried out bilaterally along the lamina, facet joint, and transverse process of L1 on the right and lamina on the left.  Self- retaining retractor was placed for exposure.  X-rays showed approach to the appropriate levels.  Top loading nuts on top of the instrumentation was removed as was the rod.  We then did a generous laminotomy by removing the inferior 80% of the L1 lamina, medial three-quarters facet joint, and any residual bone and superior facet of L2.  Residual bone was removed and saved for use later in the case.  Hypertrophic  ligamentum flavum was removed.  L1 and L2 nerve roots well visualized, well decompressed more so than needed for transverse lumbar interbody fusion.  Disk space was then incised, distracted up to a 10 mm size.  We felt this was a good choice.  The disk space was thoroughly cleaned out, and a variety of instruments were used to prepare for transverse lumbar interbody fusion.  PEEK interbody spacer filled with the EquivaBone and autologous bone was prepared and impacted in the interspace.  Prior to placing the spacer,  same mixture was placed deep within the disk space to help with the interbody fusion.  At this time, pedicle screw instrumentation was carried up to L1 on the right side.  Standard entry point was used and then an awl was passed from a slightly lateral to medial direction through the pedicle without difficulty.  We palpated the medial pedicle and felt no evidence of breech.  Then tapped with a four 5-mm tap and then placed a 5.5 x 45 mm screw in excellent position. We then chose a 65-mm rod, attached it to the top of the screws at L1, L2, and L3.  Prior to securing the rod, we decorticated the far lateral region L1-2, did a posterolateral fusion with a mixture of EquivaBone and autologous bone.  We then secured the rod doing final tightening with torque and counter torque, and its compression well went down to L2.  Final fluoroscopy in AP and lateral direction showed good placement of the interbody device as well as the pedicle screw fixation. Irrigation was carried out.  Any bleeding controlled with bipolar coagulation and Gelfoam.  The wound was then closed in multiple layers of Vicryl in the muscle, fascia, subcutaneous, and subcuticular tissues and the staples were placed on the skin.  An epidural drain was left the epidural space and brought it out through a separate stab wound incision.  The patient was then taken to recovery room in stable condition.          ______________________________ Reinaldo Meeker, M.D.     ROK/MEDQ  D:  03/21/2011  T:  03/21/2011  Job:  [id number]  Electronically Signed by Aliene Beams M.D. on 05/15/2011 08:31:06 PM

## 2011-05-15 NOTE — Op Note (Signed)
Anthony Hebert, Anthony Hebert NO.:  [id number]  MEDICAL RECORD NO.:  [id number]  LOCATION:  3105                         FACILITY:  [facility]  PHYSICIAN:  Reinaldo Meeker, M.D. DATE OF BIRTH:  Mar 06, 1951  DATE OF PROCEDURE:  03/21/2011 DATE OF DISCHARGE:                              OPERATIVE REPORT   PREOPERATIVE DIAGNOSIS:  Postoperative weakness status post L1-2, transforaminal lumbar interbody fusion.  POSTOPERATIVE DIAGNOSIS:  Postoperative weakness status post L1-2, transforaminal lumbar interbody fusion.  PROCEDURE:  Re-exploration of lumbar wound.  SURGEON:  Reinaldo Meeker, MD  SURGICAL INDICATIONS:  Anthony Hebert is a 60 year old gentleman who earlier this morning had a right-sided TLIF at L1-2.  He woke with proximal weakness of both lower extremities particularly the psoas and quadriceps muscle.  He had a better strength at the dorsiflexors and normal plantarflexion strength.  He had patchy sensory abnormalities on his lower extremities which were nondermatomal in nature.  He was taken for a stat CT scan which did not really show a large hematoma.  The patient is unable to have an MRI scan, therefore could not have that study to see if he had some sort of conus vascular issue.  There were a few areas on the CT scan that were very difficult to interpret and therefore we just selected to take him back to the operating room, reexplored the wound to make sure if there was some sort of compressive hematoma.  PROCEDURE IN DETAIL:  After being placed in the prone position, the patient's back was prepped and draped in usual sterile fashion. Previous staples from the last procedure have later been removed.  The Vicryl stitches were cut.  Self-retaining retractor was placed for exposure.  There was a layered Gelfoam that have been left over the dura but there was no obvious hematoma.  The Gelfoam was gently dissected away from the dura and there was no evidence of  a hematoma and neural compression on the thecal sac.  We explored along the L2 nerve root, did find some extruded EquivaBone material underneath the nerve root and thecal sac.  This was doing a bit of compression from the ventral aspect but very minimal and was unlikely to be an explanation for his marked bilateral lower extremity weakness.  I then explored up towards the L1 nerve root but had no evidence of compression in that area.  The thecal sac was soft.  We were able to mobilize without difficulty.  There was no evidence of hematoma or compression in any direction.  The wound was then irrigated copiously.  Any bleeding controlled with Gelfoam which was then removed and a very thin layer of Gelfoam was left behind particularly down by the L2 nerve root where there was 1 bleeder down on the pedicle.  We then left an epidural drain in the epidural space and brought out that through a separate stab wound incision.  The wound was then closed in multiple layers of Vicryl in the muscle, fascia, subcutaneous and subcuticular tissue and staples were placed on the skin.  Sterile dressing was then applied and the patient was extubated and taken to recovery room in stable condition.  ______________________________ Reinaldo Meeker, M.D.     ROK/MEDQ  D:  03/21/2011  T:  03/22/2011  Job:  [id number]  Electronically Signed by Aliene Beams M.D. on 05/15/2011 08:31:10 PM

## 2011-05-19 ENCOUNTER — Ambulatory Visit: Payer: BC Managed Care – PPO | Admitting: Physical Medicine & Rehabilitation

## 2011-06-21 ENCOUNTER — Ambulatory Visit
Admission: RE | Admit: 2011-06-21 | Discharge: 2011-06-21 | Disposition: A | Discharge status: Home / Self Care | Payer: BC Managed Care – PPO | Source: Ambulatory Visit | Attending: Neurosurgery | Admitting: Neurosurgery

## 2011-06-21 ENCOUNTER — Other Ambulatory Visit: Payer: Self-pay | Admitting: Neurosurgery

## 2011-06-21 DIAGNOSIS — M533 Sacrococcygeal disorders, not elsewhere classified: Secondary | ICD-10-CM

## 2011-06-21 DIAGNOSIS — M48061 Spinal stenosis, lumbar region without neurogenic claudication: Secondary | ICD-10-CM

## 2011-08-02 ENCOUNTER — Encounter
Payer: BC Managed Care – PPO | Attending: Physical Medicine & Rehabilitation | Admitting: Physical Medicine & Rehabilitation

## 2011-08-02 DIAGNOSIS — IMO0002 Reserved for concepts with insufficient information to code with codable children: Secondary | ICD-10-CM | POA: Insufficient documentation

## 2011-08-02 DIAGNOSIS — Z981 Arthrodesis status: Secondary | ICD-10-CM | POA: Insufficient documentation

## 2011-08-02 DIAGNOSIS — M4716 Other spondylosis with myelopathy, lumbar region: Secondary | ICD-10-CM

## 2011-08-02 DIAGNOSIS — M48061 Spinal stenosis, lumbar region without neurogenic claudication: Secondary | ICD-10-CM | POA: Insufficient documentation

## 2011-08-02 NOTE — Assessment & Plan Note (Signed)
[name] is back regarding his lumbar radiculopathy and myelopathy. Back pain is improving.  He is having some tingling and burning in the foot and he notes that his arches are collapsing with gait.  He is wearing a soft pair of shoes today that he wears a lot around the house and outside the home.  He has a stiffer pair of shoes he states that he wears when he is outside hunting, etc.  REVIEW OF SYSTEMS:  Notable for the above.  Full 12-point review is in the written health and history section of the chart.  Dr. Gerlene Fee apparently placed the patient on some Relafen today for some of his new pain problems.  SOCIAL HISTORY:  The patient is divorced, lives alone.  He is a taxidermist.  PHYSICAL EXAMINATION:  VITAL SIGNS:  Blood pressure is 109/64, pulse 16, respiratory rate is 16 and he is satting 96% on room air. GENERAL:  The patient is pleasant and alert. EXTREMITIES:  He ambulated for me and really had normal gait patterns. Strength is 5/5 with some diminished sensory function in the leg still right more than left.  He took his shoes off and his arches are fairly well-preserved.  He did not flatten with weightbearing or walking that I could see.  The shoes that they wear is all today are very soft and the arch collapses easily than those.  Reflexes are trace to 1+ in either lower extremity.  Upper extremity strength is normal. NEUROLOGIC:  Cognitively, he is alert and appropriate.  ASSESSMENT:  Lumbar stenosis with radiculopathy, status post L1-L2 decompressive laminectomy and fusion.  The patient with persistent sensory loss and problems with activity tolerance and persistent radicular pain into the legs right greater than left.  He feels that his arches are collapsing.  PLAN: 1. I believe his arches are fairly stable.  I did recommend that he     wears a different pair of shoes to help support the arch.  He     states that he has some custom insoles.  What he is wearing  now     offers no support whatsoever. 2. Need to be realistic about his recovery regarding this whole     process.  He has made remarkable improvement so far and his     endurance should improve with time as he exercises and builds-up     more strength in his musculature. 3. I did add Neurontin 100 mg, titrating up to t.i.d. over 1 week's     time.  We will titrate this further as needed. 4. Relafen per Dr. Gerlene Fee. 5. Recommended stiffer shoes/arch support wear online chair at the     time. 6. I will see him back here in about 2 months.  He will call me with     any problems or questions in the meantime.     Ranelle Oyster, M.D. Electronically Signed    ZTS/MedQ D:  08/02/2011 13:20:12  T:  08/02/2011 22:36:23  Job #:  [id number]

## 2011-09-27 ENCOUNTER — Encounter
Payer: BC Managed Care – PPO | Attending: Physical Medicine & Rehabilitation | Admitting: Physical Medicine & Rehabilitation

## 2011-09-27 DIAGNOSIS — IMO0001 Reserved for inherently not codable concepts without codable children: Secondary | ICD-10-CM

## 2011-09-27 DIAGNOSIS — IMO0002 Reserved for concepts with insufficient information to code with codable children: Secondary | ICD-10-CM

## 2011-09-27 DIAGNOSIS — M4716 Other spondylosis with myelopathy, lumbar region: Secondary | ICD-10-CM

## 2011-09-27 DIAGNOSIS — Z981 Arthrodesis status: Secondary | ICD-10-CM | POA: Insufficient documentation

## 2011-09-28 NOTE — Assessment & Plan Note (Signed)
HISTORY:  Anthony Hebert is back regarding his lumbar radiculopathy and myelopathy.  Low back pain is improved.  He has better strength in his legs and his dysesthesias are better in his legs.  He does have some shoulder and neck pain that is troublesome when he is more active doing his work.  Pain today is at 2 to 3/10.  He found that the gabapentin was helpful.  He only uses it twice a day.  He uses some hydrocodone for breakthrough shoulder pain up to twice a day per Dr. Gerlene Fee when his activity level increases.  SOCIAL HISTORY:  The patient continues to work as a Geneticist, molecular.  He reports he is about 70 hours a week currently.  SOCIAL HISTORY:  Unchanged.  PHYSICAL EXAMINATION:  VITAL SIGNS:  Blood pressure is 111/57, pulse 75, respiratory rate 18, satting 95% on room air. GENERAL:  The patient is pleasant, alert. MUSCULOSKELETAL:  He does have some diminishment of the legs, right more than left over the L3-L4 dermatomes predominantly, perhaps at L5. Reflexes are 1+ to trace in either leg.  Strength is 3+ to 4/5 hip flexors, 4/5 knee and ankle.  He is 4 to 4+/5 on the left lower.  He walks without any limp.  He does not use an adaptive device.  Upper extremity strength grossly 4+/5.  He had some pain over the trapezius area and into the cervical paraspinal muscles.  Cognitively, he is intact.  ASSESSMENT:  Lumbar spondylosis and stenosis with radiculopathy, status post L1-L2 decompressive laminectomy and fusion.  PLAN: 1. The patient has improved quite a bit.  He will stay with Neurontin     100 mg b.i.d. at present.  I encouraged him to wean from his     hydrocodone as able.  He needs to work on activity modification of     baseball, built, and rest breaks. 2. I will see him back here as needed in the future.  The patient was     encouraged to call with any questions or problems.     Ranelle Oyster, M.D. Electronically Signed    ZTS/MedQ D:  09/27/2011 14:12:13  T:   09/28/2011 12:09:15  Job #:  [id number]

## 2011-10-13 DIAGNOSIS — M109 Gout, unspecified: Secondary | ICD-10-CM

## 2011-10-13 HISTORY — DX: Gout, unspecified: M10.9

## 2011-12-15 ENCOUNTER — Other Ambulatory Visit: Payer: Self-pay | Admitting: Neurosurgery

## 2011-12-15 DIAGNOSIS — M542 Cervicalgia: Secondary | ICD-10-CM

## 2011-12-15 DIAGNOSIS — M545 Low back pain: Secondary | ICD-10-CM

## 2011-12-19 ENCOUNTER — Ambulatory Visit
Admission: RE | Admit: 2011-12-19 | Discharge: 2011-12-19 | Disposition: A | Discharge status: Home / Self Care | Payer: BC Managed Care – PPO | Source: Ambulatory Visit | Attending: Neurosurgery | Admitting: Neurosurgery

## 2011-12-19 VITALS — BP 110/67 | HR 61

## 2011-12-19 DIAGNOSIS — M545 Low back pain: Secondary | ICD-10-CM

## 2011-12-19 DIAGNOSIS — M542 Cervicalgia: Secondary | ICD-10-CM

## 2011-12-19 MED ORDER — HYDROCODONE-ACETAMINOPHEN 5-325 MG PO TABS
2.0000 | ORAL_TABLET | Freq: Once | ORAL | Status: AC
  Administered 2011-12-19: 2 via ORAL

## 2011-12-19 MED ORDER — DIAZEPAM 5 MG PO TABS
10.0000 mg | ORAL_TABLET | Freq: Once | ORAL | Status: AC
  Administered 2011-12-19: 10 mg via ORAL

## 2011-12-19 MED ORDER — IOHEXOL 300 MG/ML  SOLN
10.0000 mL | Freq: Once | INTRAMUSCULAR | Status: AC | PRN
  Administered 2011-12-19: 10 mL via INTRATHECAL

## 2011-12-19 NOTE — Discharge Instructions (Signed)

## 2011-12-19 NOTE — Progress Notes (Signed)
Patient back to nursing area after myelo, CTs and uprights.  Resting quietly on back on stretcher.  Medicated for pain.  Ice pack to back of neck and HOB elevated briefly to try to relieve headache/neck pain.  jkl

## 2011-12-21 ENCOUNTER — Other Ambulatory Visit: Payer: Self-pay | Admitting: *Deleted

## 2011-12-21 MED ORDER — GABAPENTIN 100 MG PO CAPS: 100.0000 mg | ORAL_CAPSULE | Freq: Three times a day (TID) | ORAL | Status: DC

## 2012-03-07 ENCOUNTER — Telehealth: Payer: Self-pay

## 2012-03-07 NOTE — Telephone Encounter (Signed)
Lm for pt returning his call.

## 2012-03-07 NOTE — Telephone Encounter (Signed)
Pt has a question regarding stopping a medication.  Please call [phone]

## 2012-03-08 ENCOUNTER — Telehealth: Payer: Self-pay | Admitting: *Deleted

## 2012-03-08 MED ORDER — GABAPENTIN 100 MG PO CAPS: 100.0000 mg | ORAL_CAPSULE | Freq: Two times a day (BID) | ORAL | Status: DC

## 2012-03-08 NOTE — Telephone Encounter (Signed)
Pt returning call

## 2012-03-08 NOTE — Telephone Encounter (Signed)
Pt needed a refill on gabapentin.

## 2012-03-08 NOTE — Telephone Encounter (Signed)
Has a question about a refill on one of his medications and how to come off this medication. Please call.

## 2012-08-04 IMAGING — CR DG CHEST 1V PORT
1 series · 1 of 1 positions shown · non-contrast
Comparison: 03/15/2011

CLINICAL DATA: Endotracheal tube placement

PORTABLE CHEST - 1 VIEW

[view not recorded]
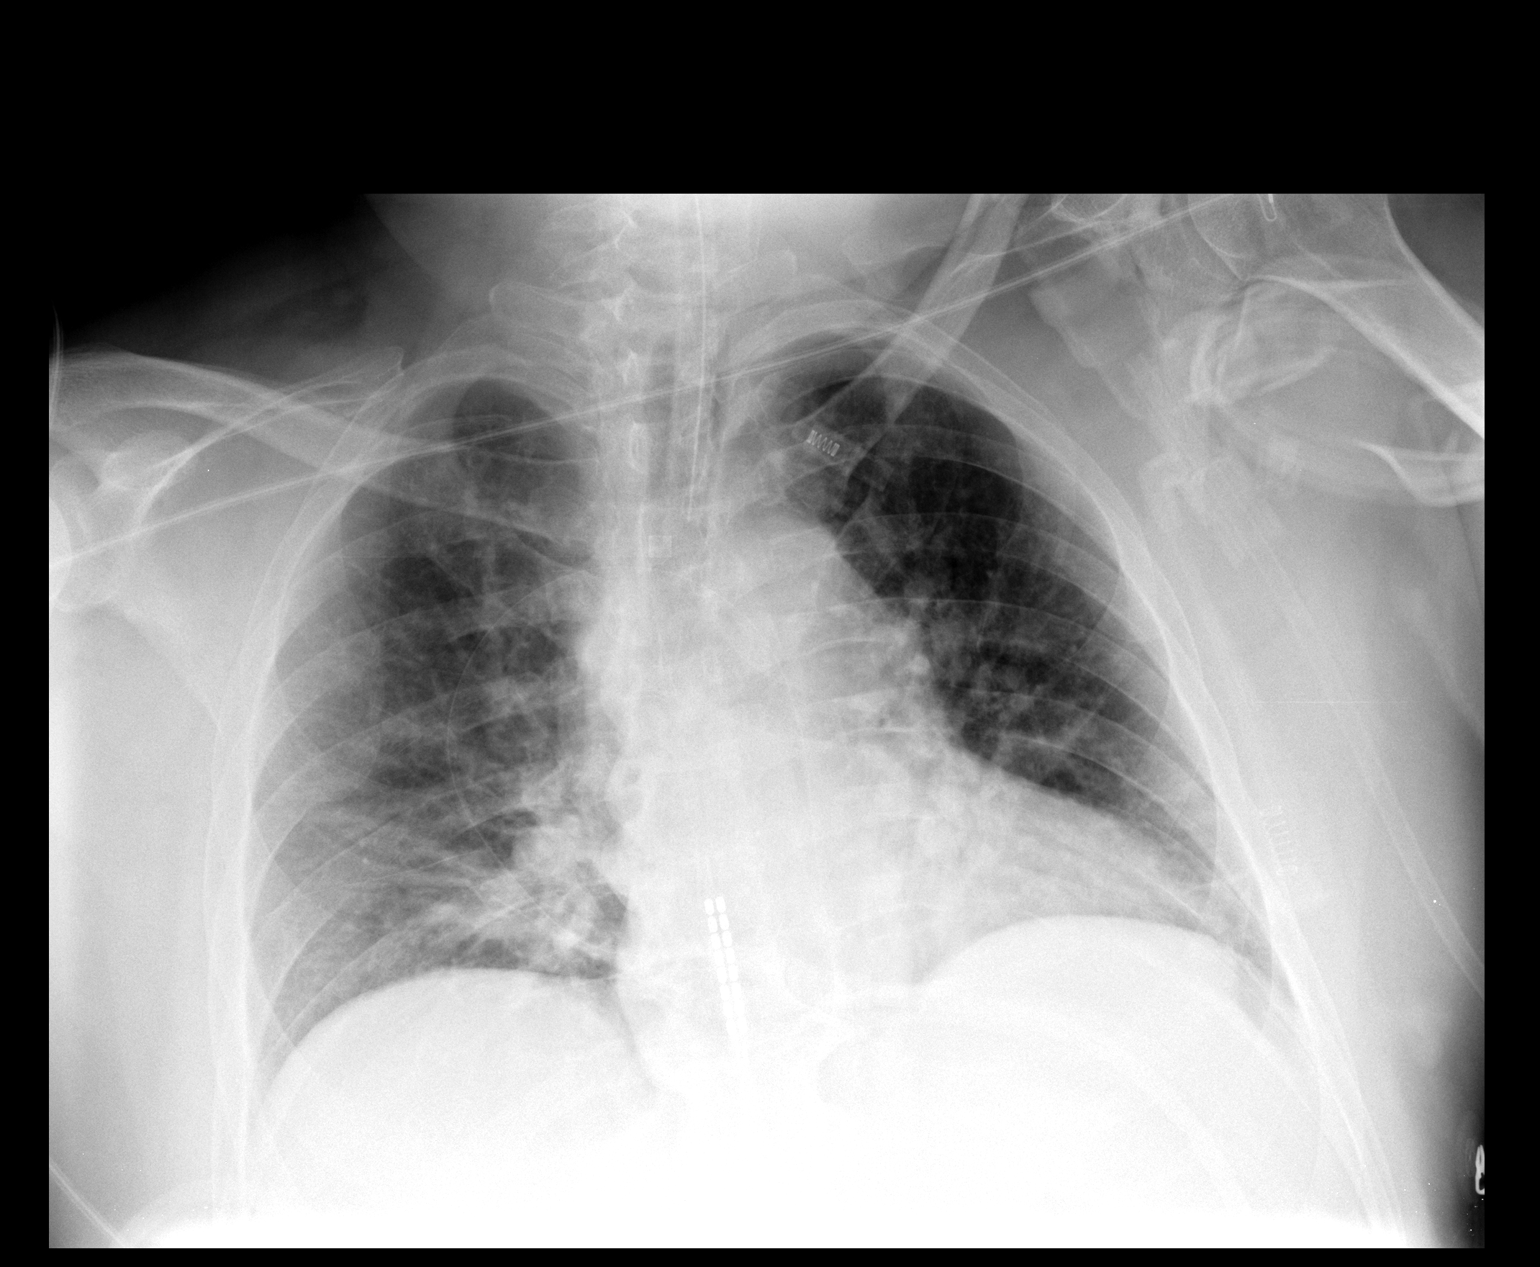

[1 of 1 positions shown; findings below may reference images not displayed]

FINDINGS: Endotracheal tube is appropriately positioned.
Neurostimulator visualized over the lower thoracic spine.  Lungs
are hypo aerated with curvilinear patchy bilateral airspace
opacities, likely atelectasis. No pleural effusion.  Heart size is
normal.
IMPRESSION: Endotracheal tube appropriately positioned.

Bilateral lower lobe curvilinear airspace opacities, which could
represent atelectasis, although aspiration or early pneumonia could
have a similar appearance depending on the clinical context.

## 2012-08-04 IMAGING — CT CT L SPINE W/O CM
4 of 8 series · 13 of 33 positions shown, 15 images · non-contrast
Comparison: Intraoperative exam 03/21/2011.  Preoperative post
myelogram CT 11/01/2010.

CLINICAL DATA: Immediately post surgery with inability to move or
feel right leg and decreased feeling left leg.  Symptoms are new
since the surgery.

CT LUMBAR SPINE WITHOUT CONTRAST
TECHNIQUE: Multidetector CT imaging of the lumbar spine was
performed without intravenous contrast administration. Multiplanar
CT image reconstructions were also generated.

[Series 4: 2mm axial soft tissue · axial · 0.23mm/px · z∈[-345,-229]mm · 4 of 98 slices shown]
[im 20/98  soft-tissue]
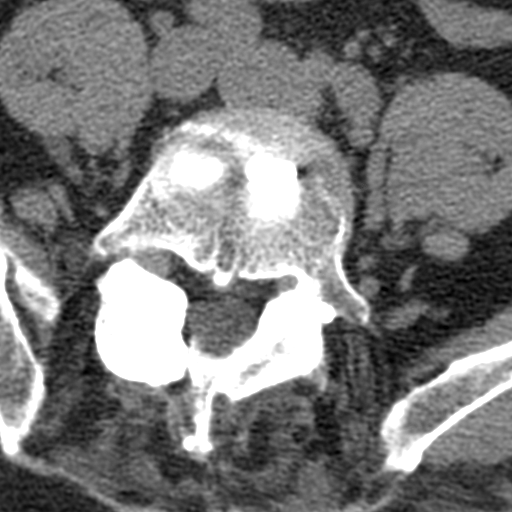
[im 39/98  soft-tissue]
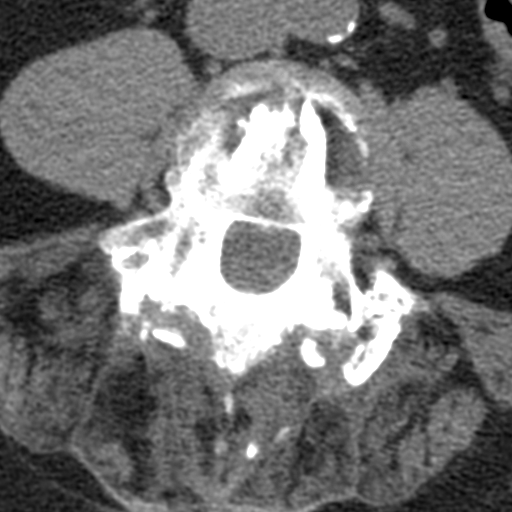
[im 59/98  soft-tissue]
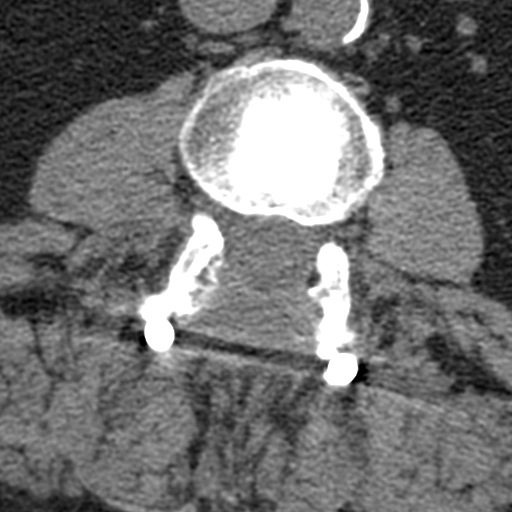
[im 78/98  soft-tissue]
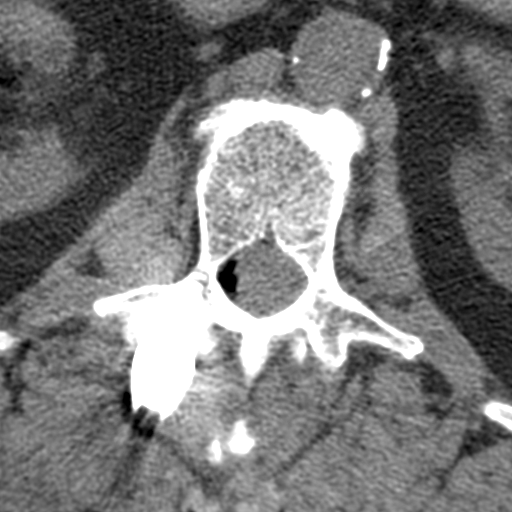

[Series 602: sagittals detail · sagittal · 0.43mm/px · 5 of 48 slices shown, 6 images]
[im 16/48  bone]
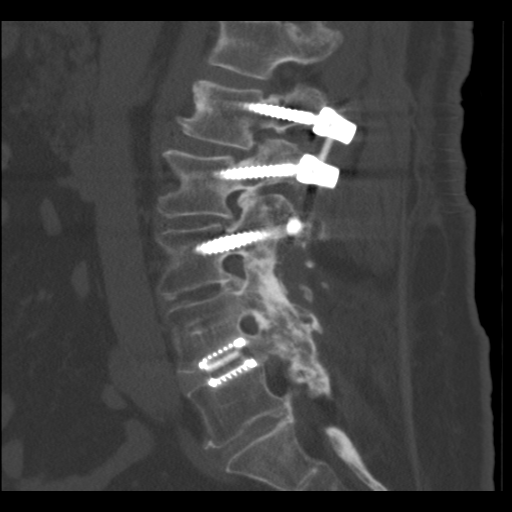
[im 20/48  bone]
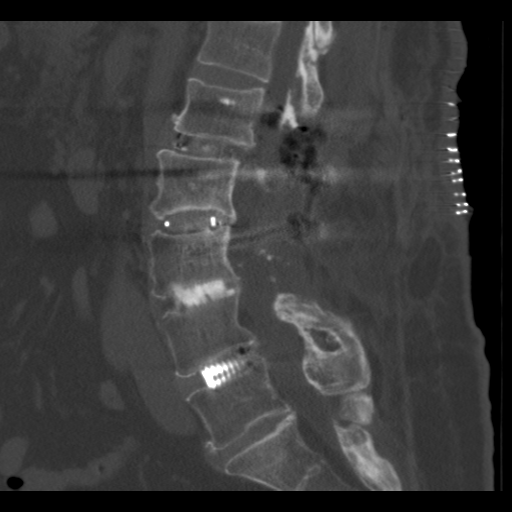
[im 24/48  soft-tissue]
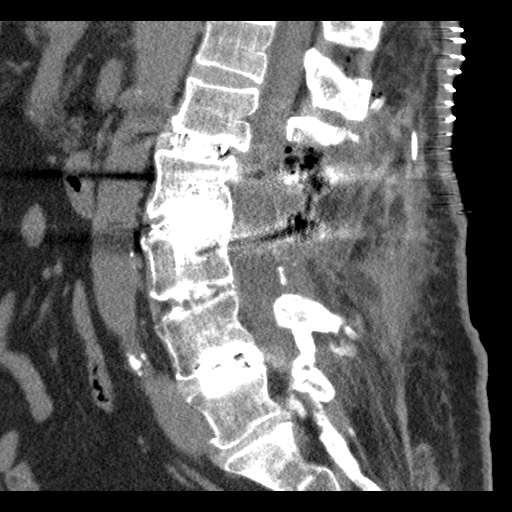
[im 24/48  bone]
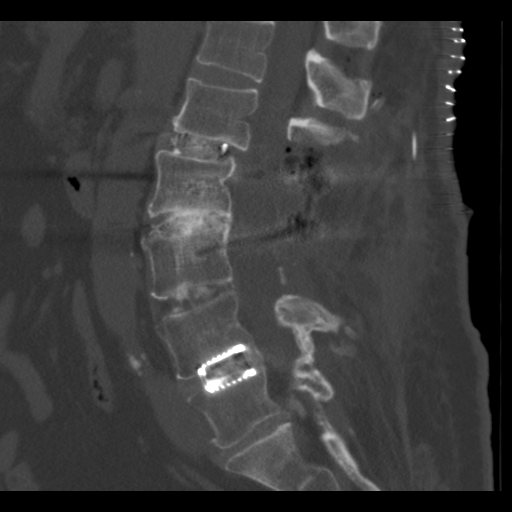
[im 28/48  bone]
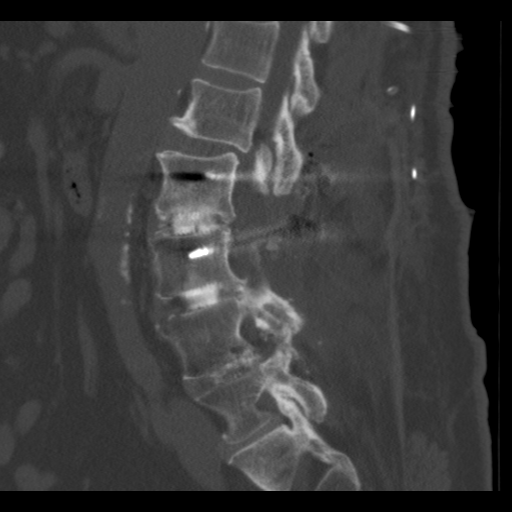
[im 32/48  bone]
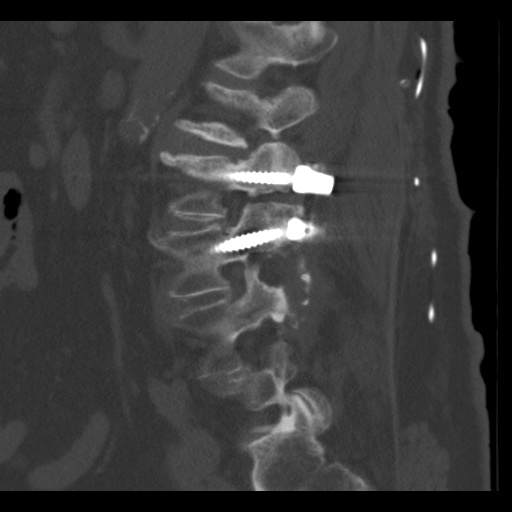

[Series 605: axials detail · axial · 0.43mm/px · z∈[-366,-322]mm · 2 of 74 slices shown]
[im 25/74  bone]
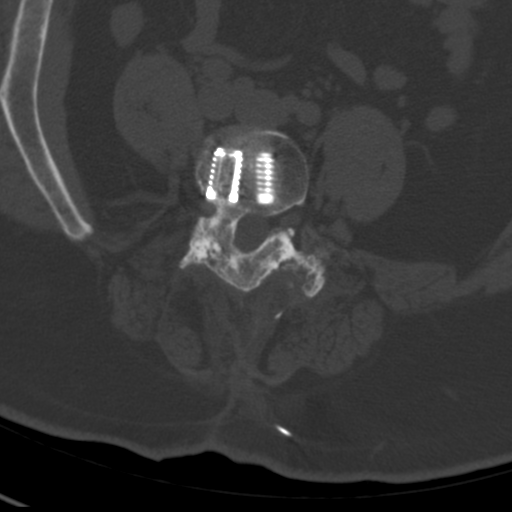
[im 49/74  bone]
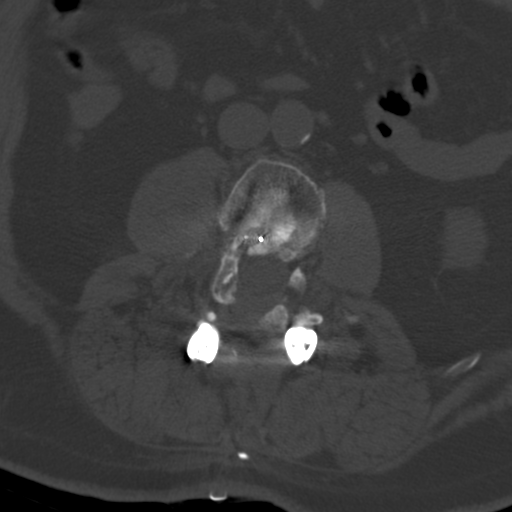

[Series 609: axials bone · axial · 0.43mm/px · z∈[-359,-310]mm · 2 of 78 slices shown, 3 images]
[im 26/78  soft-tissue]
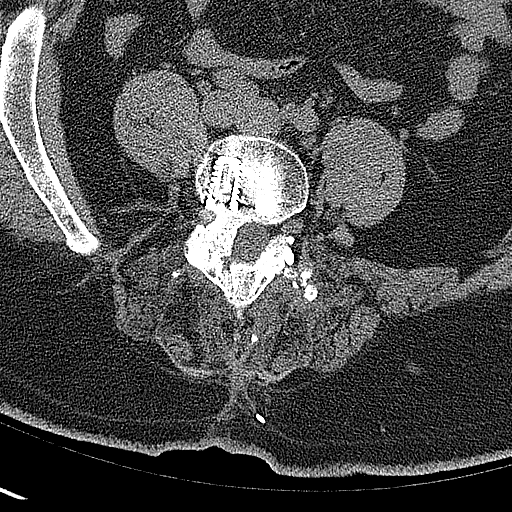
[im 26/78  bone]
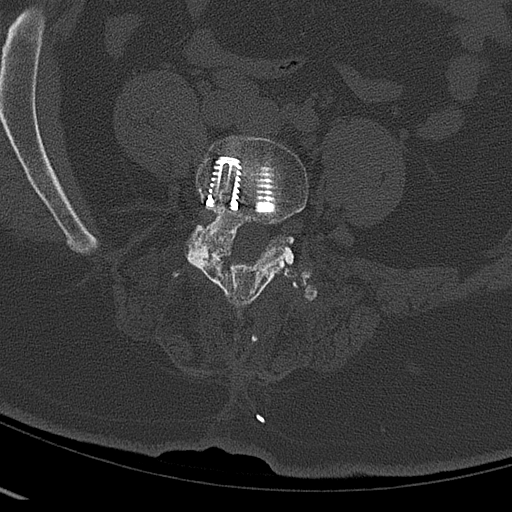
[im 52/78  bone]
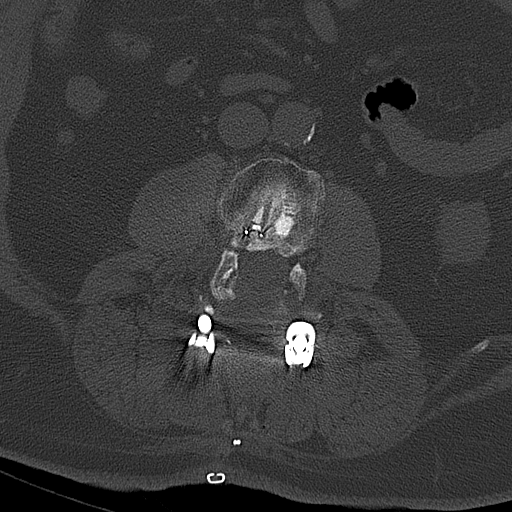

[13 of 33 positions shown; findings below may reference images not displayed]

FINDINGS: Immediate postop with placement of a right-sided L1
pedicle screw, extension of the posterior right sided connecting
bar from the L1 to the L3 level and  placement of interbody spacer
at the L1-L2 level.

The right L1 pedicle screw appears in appropriate position.

Interbody spacer has been placed at the L1-2 level with mild
subsiding into the adjacent endplates.

Immediately inferior to the L1-2 disc space is radiopaque material
which fills the right L1-2 lateral recess reaching the L2 pedicle
level with extension across midline suggestive of possible
combination of blood and fusion material extruded from the L1-2
interbody spacer (graft material utilized for bony fusion).
Immediately adjacent to the left L2 pedicle is radiopaque material,
blood versus result of artifact caused by adjacent pedicle screws.

Evaluation is limited by metallic artifact and if further
delineation is clinically desired in this patient who cannot have a
MRI (neurostimulating device placed) a post myelogram CT may be
considered.

Gas is seen along the operative site not unexpected given the
immediate postoperative state.  This is most notable in the L1-2
laminectomy site and greater to the right of midline.

Residual L1 retrolisthesis and facet joint degenerative changes
cause moderate bilateral L1-2 foraminal narrowing encroaching upon
the exiting L1 nerve roots.

Levoscoliosis.

T12-L1:  Minimal bulge.  Mild facet joint degenerative changes.

L1-2:  As above.

L2-3:  Remote laminectomy and fusion.  Pedicle screws with metallic
artifact limit evaluation.  Posterior bony fusion with bony
overgrowth contributing to right-sided foraminal narrowing mild
encroaching upon the exiting right L2 nerve root.

L3-4:  Remote laminectomy and fusion.  Solid bony fusion across the
disc space and involving posterior elements.  No significant spinal
stenosis or foraminal narrowing.

L4-5:  Remote fusion with ray cages.  Posterior element bony
fusion.  Bony overgrowth on the right with flattening of the right
lateral aspect of the thecal sac.

L5-S1:  Posterior element fusion with bony overgrowth greater on
the right with slight flattening of the right lateral aspect of the
thecal sac.  Bony spur and left-sided facet joint bony overgrowth
contribute to mild to moderate left lateral recess stenosis.  Mild
left foraminal narrowing.

Sacroiliac joint degenerative changes with bony overgrowth on the
right.

Atherosclerotic type changes of the aorta and iliac arteries and
right lower pole 1.3 cm cyst incompletely assessed on the present
exam.
IMPRESSION: Immediate postop with placement of a right-sided L1 pedicle screw,
extension of the posterior right sided connecting bar from the L1
to the L3 level and  placement of interbody spacer at the L1-L2
level.

The right L1 pedicle screw appears in appropriate position.

Interbody spacer has been placed at the L1-2 level with mild
subsiding into the adjacent endplates.

Immediately inferior to the L1-2 disc space is radiopaque material
which fills the right L1-2 lateral recess reaching the L2 pedicle
level with extension across midline suggestive of possible
combination of blood and fusion material extruded from the L1-2
interbody spacer (graft material utilized for bony fusion).

Immediately adjacent to the left L2 pedicle is radiopaque material,
blood versus result of artifact caused by adjacent pedicle screws.

Evaluation is limited by metallic artifact and if further
delineation is clinically desired in this patient who cannot have a
MRI (neurostimulating device placed) a post myelogram CT may be
considered.

Residual L1 retrolisthesis and facet joint degenerative changes
cause moderate bilateral L1-2 foraminal narrowing encroaching upon
the exiting L1 nerve roots.

Remainder of findings as detailed above.

Images reviewed with Dr. Mile-Belo at the time imaging.

## 2012-09-09 ENCOUNTER — Other Ambulatory Visit: Payer: Self-pay | Admitting: Neurosurgery

## 2012-09-16 NOTE — Pre-Procedure Instructions (Addendum)
Anthony Hebert  09/16/2012   Your procedure is scheduled on:  Thurs, Feb 6 @ 11:30 AM  Report to [facility] at 9:30 AM.  Call this number if you have problems the morning of surgery: [phone]   Remember:   Do not eat food or drink liquids after midnight.   Take these medicines the morning of surgery with A SIP OF WATER: Allopurinol(Zyloprim),Pain Pill(if needed),and OTC acid reflux medicine   Do not wear jewelry  Do not wear lotions, powders, or colognes. You may wear deodorant.  Men may shave face and neck.  Do not bring valuables to the hospital.  Contacts, dentures or bridgework may not be worn into surgery.  Leave suitcase in the car. After surgery it may be brought to your room.  For patients admitted to the hospital, checkout time is 11:00 AM the day of  discharge.   Patients discharged the day of surgery will not be allowed to drive  home.    Special Instructions: Shower using CHG 2 nights before surgery and the night before surgery.  If you shower the day of surgery use CHG.  Use special wash - you have one bottle of CHG for all showers.  You should use approximately 1/3 of the bottle for each shower.   Please read over the following fact sheets that you were given: Pain Booklet, Coughing and Deep Breathing, MRSA Information and Surgical Site Infection Prevention

## 2012-09-17 ENCOUNTER — Encounter (HOSPITAL_COMMUNITY)
Admission: RE | Admit: 2012-09-17 | Discharge: 2012-09-17 | Disposition: A | Discharge status: Home / Self Care | Payer: BC Managed Care – PPO | Source: Ambulatory Visit | Attending: Anesthesiology | Admitting: Anesthesiology

## 2012-09-17 ENCOUNTER — Encounter (HOSPITAL_COMMUNITY): Payer: Self-pay | Admitting: Vascular Surgery

## 2012-09-17 ENCOUNTER — Encounter (HOSPITAL_COMMUNITY)
Admission: RE | Admit: 2012-09-17 | Discharge: 2012-09-17 | Disposition: A | Discharge status: Home / Self Care | Payer: BC Managed Care – PPO | Source: Ambulatory Visit | Attending: Neurosurgery | Admitting: Neurosurgery

## 2012-09-17 ENCOUNTER — Encounter (HOSPITAL_COMMUNITY): Payer: Self-pay

## 2012-09-17 HISTORY — DX: Cardiac murmur, unspecified: R01.1

## 2012-09-17 HISTORY — DX: Gastro-esophageal reflux disease without esophagitis: K21.9

## 2012-09-17 HISTORY — DX: Essential (primary) hypertension: I10

## 2012-09-17 LAB — BASIC METABOLIC PANEL
CO2: 29 mEq/L (ref 19–32)
Calcium: 9.8 mg/dL (ref 8.4–10.5)
Creatinine, Ser: 1.04 mg/dL (ref 0.50–1.35)
GFR calc non Af Amer: 76 mL/min — ABNORMAL LOW (ref >90)
Glucose, Bld: 107 mg/dL — ABNORMAL HIGH (ref 70–99)
Sodium: 139 mEq/L (ref 135–145)

## 2012-09-17 LAB — CBC
MCH: 31.3 pg (ref 26.0–34.0)
MCV: 87.9 fL (ref 78.0–100.0)
Platelets: 143 10*3/uL — ABNORMAL LOW (ref 150–400)
RBC: 4.73 MIL/uL (ref 4.22–5.81)
RDW: 12.6 % (ref 11.5–15.5)

## 2012-09-17 LAB — SURGICAL PCR SCREEN: Staphylococcus aureus: NEGATIVE

## 2012-09-17 NOTE — Consult Note (Signed)
Anesthesia Consult:  Patient is a 62 year old male scheduled for removal of lumbar spinal cord stimulator on 09/19/12 by Dr. Gerlene Fee.  History includes DIFFICULT AIRWAY, non-smoker, HTN, GERD, melanoma, heart murmur with aortic valve sclerosis and trivial TR by 07/2012 echo.  He reported a possible CVA after surgery > 10 years ago, but denied known CAD history.  OSA screening score was a 5.  PCP is Dr. Gillermina Hu with [facility] in [place].   He was seen by Anesthesiologist Dr. Noreene Larsson prior to his last surgeries in August 2012. At that time patient reported a previous awake nasal intubation, but later had a successful intubation with glidescope.  On 03/21/11, he underwent right L1-2 fusion.  He had post-operative weakness and was taken back to the OR that same day for wound exploration.  He was intubated for both of these procedures using a glidescope and rigid stylet.  His airway was noted to be very anterior with small opening.  (Both anesthesia records are on his chart for review.)  Following his second surgery, he remained intubated due to low respiratory rate and periods of apnea, negative cuff leak.  He was seen by Critical Care and ultimately extubated on POD #1.  According to [facility] notes, there was question if undiagnosed OSA contributed to his post-operative respiratory failure as he had no known history of pulmonary disease.  He has not had a sleep study.  Exam shows a pleasant, Caucasian male.  He has a full beard.  Mouth has limited opening.  Heart RRR, grade I-II/VI SEM.  Lungs clear.  No LE edema.  No carotid bruits noted.  He denies chest pain, SOB at rest, syncope/pre-syncope.  He feels his mobility is ~ 95% since his LE "paralysis" following his surgery in 03/2011.  EKG on 09/17/12 showed NSR, non-specific ST abnormality in inferior leads.  His EKG appears stable since 03/21/11.  He was referred to Cardiologist Dr. Eldridge Dace for a heart murmur in December 2013.  Echo on 08/02/12 showed  LVEF 60-65%, aortic valve sclerosis but opens well. Trivial TR.  CXR on 09/17/12 showed no active disease of the chest.  Preoperative labs noted.  Patient has a known difficult airway, but was successfully intubated using a glidescope in 2012.  It's not quite clear why he had post-operative respiratory failure following his second surgery, but undiagnosed OSA may have played a part.  He is aware that he should follow-up with his PCP regarding possible future sleep study.  He was not asked to shave his beard with his previous surgeries, but he has grown it out quite a bit since then.  I discussed benefits of shaving in regards to anesthesia/airway management.  He will at least trim his beard.  He understands that further shaving of his beard could be recommended once evaluated by his anesthesiologist.  Anesthesiologist Dr. Jacklynn Bue updated on anesthesia history.  Shonna Chock, PA-C 09/17/12 [phone]

## 2012-09-17 NOTE — Progress Notes (Signed)
09/17/12 0939  OBSTRUCTIVE SLEEP APNEA  Score 4 or greater  Results sent to PCP

## 2012-09-17 NOTE — Progress Notes (Addendum)
Anthony Hebert z pa consult with patient re: diff with last surg ?diff intubation.  req'd echo from [facility] from 2 months ? ago

## 2012-09-17 NOTE — Anesthesia Preprocedure Evaluation (Addendum)
Anesthesia Evaluation  Patient identified by MRN, date of birth, ID band Patient awake    Reviewed: Allergy & Precautions, H&P , NPO status , Patient's Chart, lab work & pertinent test results, reviewed documented beta blocker date and time   History of Anesthesia Complications (+) DIFFICULT AIRWAY  Airway Mallampati: III TM Distance: >3 FB Neck ROM: Full  Mouth opening: Limited Mouth Opening  Dental  (+) Teeth Intact and Caps   Pulmonary    Pulmonary exam normal       Cardiovascular hypertension, Pt. on medications + Valvular Problems/Murmurs     Neuro/Psych    GI/Hepatic GERD-  ,  Endo/Other    Renal/GU      Musculoskeletal   Abdominal Normal abdominal exam  (+)   Peds  Hematology   Anesthesia Other Findings   Reproductive/Obstetrics                           Anesthesia Physical Anesthesia Plan  ASA: III  Anesthesia Plan: General   Post-op Pain Management:    Induction: Intravenous  Airway Management Planned: Oral ETT  Additional Equipment:   Intra-op Plan:   Post-operative Plan: Extubation in OR  Informed Consent: I have reviewed the patients History and Physical, chart, labs and discussed the procedure including the risks, benefits and alternatives for the proposed anesthesia with the patient or authorized representative who has indicated his/her understanding and acceptance.   Dental advisory given  Plan Discussed with: CRNA, Anesthesiologist and Surgeon  Anesthesia Plan Comments: (See my anesthesia note.  Shonna Chock, PA-C)       Anesthesia Quick Evaluation

## 2012-09-18 MED ORDER — CEFAZOLIN SODIUM-DEXTROSE 2-3 GM-% IV SOLR
2.0000 g | INTRAVENOUS | Status: AC
  Administered 2012-09-19: 2 g via INTRAVENOUS
  Filled 2012-09-18: qty 50

## 2012-09-19 ENCOUNTER — Encounter (HOSPITAL_COMMUNITY): Payer: Self-pay | Admitting: Vascular Surgery

## 2012-09-19 ENCOUNTER — Ambulatory Visit (HOSPITAL_COMMUNITY)
Admission: RE | Admit: 2012-09-19 | Discharge: 2012-09-20 | Disposition: A | Discharge status: Home / Self Care | Payer: BC Managed Care – PPO | Source: Ambulatory Visit | Attending: Neurosurgery | Admitting: Neurosurgery

## 2012-09-19 ENCOUNTER — Ambulatory Visit (HOSPITAL_COMMUNITY): Payer: BC Managed Care – PPO | Admitting: Vascular Surgery

## 2012-09-19 ENCOUNTER — Encounter (HOSPITAL_COMMUNITY): Payer: Self-pay | Admitting: Anesthesiology

## 2012-09-19 ENCOUNTER — Encounter (HOSPITAL_COMMUNITY)
Admission: RE | Disposition: A | Discharge status: Home / Self Care | Payer: Self-pay | Source: Ambulatory Visit | Attending: Neurosurgery

## 2012-09-19 ENCOUNTER — Encounter (HOSPITAL_COMMUNITY): Payer: Self-pay | Admitting: *Deleted

## 2012-09-19 DIAGNOSIS — K219 Gastro-esophageal reflux disease without esophagitis: Secondary | ICD-10-CM | POA: Insufficient documentation

## 2012-09-19 DIAGNOSIS — Z01810 Encounter for preprocedural cardiovascular examination: Secondary | ICD-10-CM | POA: Insufficient documentation

## 2012-09-19 DIAGNOSIS — Z01812 Encounter for preprocedural laboratory examination: Secondary | ICD-10-CM | POA: Insufficient documentation

## 2012-09-19 DIAGNOSIS — Z462 Encounter for fitting and adjustment of other devices related to nervous system and special senses: Secondary | ICD-10-CM | POA: Insufficient documentation

## 2012-09-19 DIAGNOSIS — M961 Postlaminectomy syndrome, not elsewhere classified: Secondary | ICD-10-CM

## 2012-09-19 DIAGNOSIS — M549 Dorsalgia, unspecified: Secondary | ICD-10-CM | POA: Insufficient documentation

## 2012-09-19 DIAGNOSIS — Z01818 Encounter for other preprocedural examination: Secondary | ICD-10-CM | POA: Insufficient documentation

## 2012-09-19 DIAGNOSIS — I10 Essential (primary) hypertension: Secondary | ICD-10-CM | POA: Insufficient documentation

## 2012-09-19 HISTORY — PX: SPINAL CORD STIMULATOR REMOVAL: SHX5379

## 2012-09-19 SURGERY — LUMBAR SPINAL CORD STIMULATOR REMOVAL
Anesthesia: General | Site: Back | Wound class: Clean

## 2012-09-19 MED ORDER — LIDOCAINE HCL (CARDIAC) 20 MG/ML IV SOLN
INTRAVENOUS | Status: DC | PRN
  Administered 2012-09-19: 100 mg via INTRAVENOUS

## 2012-09-19 MED ORDER — SODIUM CHLORIDE 0.9 % IR SOLN
Status: DC | PRN
  Administered 2012-09-19: 12:00:00

## 2012-09-19 MED ORDER — PROPOFOL 10 MG/ML IV BOLUS
INTRAVENOUS | Status: DC | PRN
  Administered 2012-09-19: 200 mg via INTRAVENOUS

## 2012-09-19 MED ORDER — HYDROCHLOROTHIAZIDE 12.5 MG PO CAPS
12.5000 mg | ORAL_CAPSULE | Freq: Every day | ORAL | Status: DC
  Administered 2012-09-20: 12.5 mg via ORAL
  Filled 2012-09-19 (×2): qty 1

## 2012-09-19 MED ORDER — PHENOL 1.4 % MT LIQD: 1.0000 | OROMUCOSAL | Status: DC | PRN

## 2012-09-19 MED ORDER — ACETAMINOPHEN 650 MG RE SUPP: 650.0000 mg | RECTAL | Status: DC | PRN

## 2012-09-19 MED ORDER — ONDANSETRON HCL 4 MG/2ML IJ SOLN
4.0000 mg | INTRAMUSCULAR | Status: DC | PRN
  Administered 2012-09-19: 4 mg via INTRAVENOUS
  Filled 2012-09-19: qty 2

## 2012-09-19 MED ORDER — THROMBIN 5000 UNITS EX SOLR: OROMUCOSAL | Status: DC | PRN

## 2012-09-19 MED ORDER — LISINOPRIL-HYDROCHLOROTHIAZIDE 20-12.5 MG PO TABS: 1.0000 | ORAL_TABLET | Freq: Every day | ORAL | Status: DC

## 2012-09-19 MED ORDER — GLYCOPYRROLATE 0.2 MG/ML IJ SOLN
INTRAMUSCULAR | Status: DC | PRN
  Administered 2012-09-19: 0.6 mg via INTRAVENOUS

## 2012-09-19 MED ORDER — ACETAMINOPHEN 325 MG PO TABS: 650.0000 mg | ORAL_TABLET | ORAL | Status: DC | PRN

## 2012-09-19 MED ORDER — SIMVASTATIN 40 MG PO TABS
40.0000 mg | ORAL_TABLET | Freq: Every evening | ORAL | Status: DC
  Administered 2012-09-19: 40 mg via ORAL
  Filled 2012-09-19 (×2): qty 1

## 2012-09-19 MED ORDER — ONDANSETRON HCL 4 MG/2ML IJ SOLN
INTRAMUSCULAR | Status: DC | PRN
  Administered 2012-09-19: 4 mg via INTRAVENOUS

## 2012-09-19 MED ORDER — BACITRACIN 50000 UNITS IM SOLR
INTRAMUSCULAR | Status: AC
  Filled 2012-09-19: qty 1

## 2012-09-19 MED ORDER — HYDROMORPHONE HCL PF 1 MG/ML IJ SOLN
INTRAMUSCULAR | Status: AC
  Filled 2012-09-19: qty 1

## 2012-09-19 MED ORDER — SODIUM CHLORIDE 0.9 % IJ SOLN: 3.0000 mL | INTRAMUSCULAR | Status: DC | PRN

## 2012-09-19 MED ORDER — FENTANYL CITRATE 0.05 MG/ML IJ SOLN
INTRAMUSCULAR | Status: DC | PRN
  Administered 2012-09-19 (×2): 100 ug via INTRAVENOUS

## 2012-09-19 MED ORDER — HYDROMORPHONE HCL PF 1 MG/ML IJ SOLN
0.2500 mg | INTRAMUSCULAR | Status: DC | PRN
  Administered 2012-09-19 (×2): 0.5 mg via INTRAVENOUS

## 2012-09-19 MED ORDER — SODIUM CHLORIDE 0.9 % IV SOLN
INTRAVENOUS | Status: AC
  Filled 2012-09-19: qty 500

## 2012-09-19 MED ORDER — HYDROMORPHONE HCL PF 1 MG/ML IJ SOLN: 1.0000 mg | INTRAMUSCULAR | Status: DC | PRN

## 2012-09-19 MED ORDER — HEMOSTATIC AGENTS (NO CHARGE) OPTIME
TOPICAL | Status: DC | PRN
  Administered 2012-09-19: 1 via TOPICAL

## 2012-09-19 MED ORDER — HYDROCODONE-ACETAMINOPHEN 5-325 MG PO TABS
1.0000 | ORAL_TABLET | ORAL | Status: DC | PRN
  Administered 2012-09-20: 2 via ORAL
  Filled 2012-09-19: qty 2

## 2012-09-19 MED ORDER — MENTHOL 3 MG MT LOZG: 1.0000 | LOZENGE | OROMUCOSAL | Status: DC | PRN

## 2012-09-19 MED ORDER — NEOSTIGMINE METHYLSULFATE 1 MG/ML IJ SOLN
INTRAMUSCULAR | Status: DC | PRN
  Administered 2012-09-19: 4 mg via INTRAVENOUS

## 2012-09-19 MED ORDER — ONDANSETRON HCL 4 MG/2ML IJ SOLN: 4.0000 mg | Freq: Once | INTRAMUSCULAR | Status: DC | PRN

## 2012-09-19 MED ORDER — LISINOPRIL 20 MG PO TABS
20.0000 mg | ORAL_TABLET | Freq: Every day | ORAL | Status: DC
  Administered 2012-09-20: 20 mg via ORAL
  Filled 2012-09-19 (×2): qty 1

## 2012-09-19 MED ORDER — SUCCINYLCHOLINE CHLORIDE 20 MG/ML IJ SOLN
INTRAMUSCULAR | Status: DC | PRN
  Administered 2012-09-19: 140 mg via INTRAVENOUS

## 2012-09-19 MED ORDER — 0.9 % SODIUM CHLORIDE (POUR BTL) OPTIME
TOPICAL | Status: DC | PRN
  Administered 2012-09-19: 1000 mL

## 2012-09-19 MED ORDER — SODIUM CHLORIDE 0.9 % IV SOLN: 250.0000 mL | INTRAVENOUS | Status: DC

## 2012-09-19 MED ORDER — ROCURONIUM BROMIDE 100 MG/10ML IV SOLN
INTRAVENOUS | Status: DC | PRN
  Administered 2012-09-19: 30 mg via INTRAVENOUS

## 2012-09-19 MED ORDER — KCL IN DEXTROSE-NACL 20-5-0.45 MEQ/L-%-% IV SOLN
80.0000 mL/h | INTRAVENOUS | Status: DC
  Filled 2012-09-19 (×3): qty 1000

## 2012-09-19 MED ORDER — ARTIFICIAL TEARS OP OINT
TOPICAL_OINTMENT | OPHTHALMIC | Status: DC | PRN
  Administered 2012-09-19: 1 via OPHTHALMIC

## 2012-09-19 MED ORDER — THROMBIN 5000 UNITS EX KIT
PACK | CUTANEOUS | Status: DC | PRN
  Administered 2012-09-19 (×2): 5000 [IU] via TOPICAL

## 2012-09-19 MED ORDER — LACTATED RINGERS IV SOLN
INTRAVENOUS | Status: DC | PRN
  Administered 2012-09-19 (×2): via INTRAVENOUS

## 2012-09-19 MED ORDER — SODIUM CHLORIDE 0.9 % IJ SOLN: 3.0000 mL | Freq: Two times a day (BID) | INTRAMUSCULAR | Status: DC

## 2012-09-19 MED ORDER — PHENYLEPHRINE HCL 10 MG/ML IJ SOLN
INTRAMUSCULAR | Status: DC | PRN
  Administered 2012-09-19 (×2): 80 ug via INTRAVENOUS

## 2012-09-19 MED ORDER — CEFAZOLIN SODIUM-DEXTROSE 2-3 GM-% IV SOLR
2.0000 g | Freq: Three times a day (TID) | INTRAVENOUS | Status: AC
  Administered 2012-09-19 – 2012-09-20 (×2): 2 g via INTRAVENOUS
  Filled 2012-09-19 (×2): qty 50

## 2012-09-19 MED ORDER — ALLOPURINOL 300 MG PO TABS
300.0000 mg | ORAL_TABLET | Freq: Every day | ORAL | Status: DC
  Administered 2012-09-19: 300 mg via ORAL
  Filled 2012-09-19 (×2): qty 1

## 2012-09-19 SURGICAL SUPPLY — 42 items
BAG DECANTER FOR FLEXI CONT (MISCELLANEOUS) ×2 IMPLANT
BENZOIN TINCTURE PRP APPL 2/3 (GAUZE/BANDAGES/DRESSINGS) ×4 IMPLANT
BLADE SURG ROTATE 9660 (MISCELLANEOUS) IMPLANT
BRUSH SCRUB EZ PLAIN DRY (MISCELLANEOUS) ×2 IMPLANT
CANISTER SUCTION 2500CC (MISCELLANEOUS) ×2 IMPLANT
CLOTH BEACON ORANGE TIMEOUT ST (SAFETY) ×2 IMPLANT
CONT SPEC 4OZ CLIKSEAL STRL BL (MISCELLANEOUS) ×2 IMPLANT
DECANTER SPIKE VIAL GLASS SM (MISCELLANEOUS) ×2 IMPLANT
DERMABOND ADVANCED (GAUZE/BANDAGES/DRESSINGS) ×3
DERMABOND ADVANCED .7 DNX12 (GAUZE/BANDAGES/DRESSINGS) ×3 IMPLANT
DRAPE LAPAROTOMY 100X72X124 (DRAPES) ×2 IMPLANT
DRAPE PROXIMA HALF (DRAPES) IMPLANT
DRAPE SURG 17X23 STRL (DRAPES) ×4 IMPLANT
DRESSING TELFA 8X3 (GAUZE/BANDAGES/DRESSINGS) ×2 IMPLANT
ELECT REM PT RETURN 9FT ADLT (ELECTROSURGICAL) ×2
ELECTRODE REM PT RTRN 9FT ADLT (ELECTROSURGICAL) ×1 IMPLANT
GAUZE SPONGE 4X4 16PLY XRAY LF (GAUZE/BANDAGES/DRESSINGS) IMPLANT
GLOVE ECLIPSE 7.5 STRL STRAW (GLOVE) ×2 IMPLANT
GLOVE EXAM NITRILE LRG STRL (GLOVE) IMPLANT
GLOVE EXAM NITRILE MD LF STRL (GLOVE) IMPLANT
GLOVE EXAM NITRILE XL STR (GLOVE) IMPLANT
GLOVE EXAM NITRILE XS STR PU (GLOVE) IMPLANT
GOWN BRE IMP SLV AUR LG STRL (GOWN DISPOSABLE) IMPLANT
GOWN BRE IMP SLV AUR XL STRL (GOWN DISPOSABLE) ×2 IMPLANT
GOWN STRL REIN 2XL LVL4 (GOWN DISPOSABLE) IMPLANT
KIT BASIN OR (CUSTOM PROCEDURE TRAY) ×2 IMPLANT
KIT ROOM TURNOVER OR (KITS) ×2 IMPLANT
NEEDLE HYPO 22GX1.5 SAFETY (NEEDLE) ×2 IMPLANT
NS IRRIG 1000ML POUR BTL (IV SOLUTION) ×2 IMPLANT
PACK LAMINECTOMY NEURO (CUSTOM PROCEDURE TRAY) ×2 IMPLANT
PAD ARMBOARD 7.5X6 YLW CONV (MISCELLANEOUS) ×6 IMPLANT
SPONGE GAUZE 4X4 12PLY (GAUZE/BANDAGES/DRESSINGS) ×4 IMPLANT
SPONGE SURGIFOAM ABS GEL SZ50 (HEMOSTASIS) IMPLANT
STRIP CLOSURE SKIN 1/2X4 (GAUZE/BANDAGES/DRESSINGS) ×2 IMPLANT
SUT VIC AB 2-0 OS6 18 (SUTURE) ×6 IMPLANT
SUT VIC AB 3-0 CP2 18 (SUTURE) ×4 IMPLANT
SYR 20ML ECCENTRIC (SYRINGE) ×2 IMPLANT
TAPE STRIPS DRAPE STRL (GAUZE/BANDAGES/DRESSINGS) ×4 IMPLANT
TOOL FLUTED BALL 7MM (MISCELLANEOUS) IMPLANT
TOWEL OR 17X24 6PK STRL BLUE (TOWEL DISPOSABLE) ×2 IMPLANT
TOWEL OR 17X26 10 PK STRL BLUE (TOWEL DISPOSABLE) ×2 IMPLANT
WATER STERILE IRR 1000ML POUR (IV SOLUTION) ×2 IMPLANT

## 2012-09-19 NOTE — Preoperative (Signed)
Beta Blockers   Reason not to administer Beta Blockers:Not Applicable 

## 2012-09-19 NOTE — H&P (Signed)
  Anthony Hebert is an 62 y.o. male.   Chief Complaint: Failed back surgery HPI: The patient is a 62 year old gentleman who is had multiple back procedures in the past. He's had persistent pain and underwent a spinal stimulator which gave him some nice relief for a period of time. Over the years the stimulator has become less effective to the point now where he wishes were to be removed. He now comes the hospital for removal of a spinal stimulator. We discussed the risks and benefits of surgery locked the risks and benefits of alternatives. He's had the opportunity numerous questions and appears to understand. With this information in hand he has requested that we proceed with surgery.  Past Medical History  Diagnosis Date  . Difficult intubation     had trouble with intubation  . Heart murmur   . Hypertension   . GERD (gastroesophageal reflux disease)     occ  . Cancer     mole melanoma lft eye    Past Surgical History  Procedure Date  . Hernia repair 94    lft  . Back surgery     x7 plus stimulator  . Tonsillectomy   . Foot surgery     cut nerve to relieve pain    History reviewed. No pertinent family history. Social History:  reports that he has never smoked. He does not have any smokeless tobacco history on file. He reports that he drinks about 1.8 ounces of alcohol per week. He reports that he does not use illicit drugs.  Allergies: No Known Allergies  Medications Prior to Admission  Medication Sig Dispense Refill  . allopurinol (ZYLOPRIM) 300 MG tablet Take 300 mg by mouth daily.      Marland Kitchen HYDROcodone-acetaminophen (VICODIN) 5-500 MG per tablet Take 1-2 tablets by mouth every 4 (four) hours as needed. For pain      . lisinopril-hydrochlorothiazide (PRINZIDE,ZESTORETIC) 20-12.5 MG per tablet Take 1 tablet by mouth daily.      Marland Kitchen OVER THE COUNTER MEDICATION Take 1 tablet by mouth daily as needed. For acid reflux. OTC      . oxyCODONE-acetaminophen (PERCOCET/ROXICET) 5-325 MG  per tablet Take 1 tablet by mouth every 6 (six) hours as needed. For pain      . simvastatin (ZOCOR) 40 MG tablet Take 40 mg by mouth every evening.        No results found for this or any previous visit (from the past 48 hour(s)). No results found.  Review of systems not obtained due to patient factors.  Blood pressure 126/77, pulse 73, temperature 98 F (36.7 C), temperature source Oral, resp. rate 18, SpO2 97.00%.  The patient is awake alert and oriented. His gait is mildly antalgic which is long-standing. Strength is intact except for some proximal weakness on the right lower extremity. Sensation is decreased in the patchy nondermatomal fashion as lower extremities. 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 The plan is for spinal stimulator removal.  Reinaldo Meeker, MD 09/19/2012, 11:10 AM

## 2012-09-19 NOTE — Transfer of Care (Signed)
Immediate Anesthesia Transfer of Care Note  Patient: Anthony Hebert  Procedure(s) Performed: Procedure(s) (LRB) with comments: LUMBAR SPINAL CORD STIMULATOR REMOVAL (N/A)  Patient Location: PACU  Anesthesia Type:General  Level of Consciousness: awake, alert  and oriented  Airway & Oxygen Therapy: Patient Spontanous Breathing and Patient connected to face mask oxygen  Post-op Assessment: Report given to PACU RN  Post vital signs: Reviewed and stable  Complications: No apparent anesthesia complications

## 2012-09-19 NOTE — Op Note (Signed)
Preop diagnosis: Failed back syndrome Postop diagnosis: Same Procedure: Spinal stimulator removal  Surgeon: Shreeya Recendiz  After and placed the prone position the patient's back and left buttock area were prepped and draped in the usual sterile fashion. Linear incision was made in the previous incision was buttock this was dissected down to the stimulator which was dissected free of soft tissue. It was then removed without difficulty. We then opened up the thoracic incision and carried down to the lamina. Some periosteal dissection was then carried out on the left the spinous processes and lamina and we could identify the wires going into the epidural space. We dissected the soft tissue and scar tissue freeing then removed the lead without difficulty. We then cut the lead and the thoracic region and then pulled out through the buttock incision. We then irrigated both incisions copiously and closed with multiple layers of Vicryl on the subcutaneous and subcuticular layer and Dermabond and Steri-Strips on the skin. A dressing was applied the patient extubated covered stable condition.

## 2012-09-19 NOTE — Anesthesia Postprocedure Evaluation (Signed)
  Anesthesia Post-op Note  Patient: Anthony Hebert  Procedure(s) Performed: Procedure(s) (LRB) with comments: LUMBAR SPINAL CORD STIMULATOR REMOVAL (N/A)  Patient Location: PACU  Anesthesia Type:General  Level of Consciousness: awake, alert , oriented and patient cooperative  Airway and Oxygen Therapy: Patient Spontanous Breathing  Post-op Pain: mild  Post-op Assessment: Post-op Vital signs reviewed, Patient's Cardiovascular Status Stable, Respiratory Function Stable, Patent Airway, No signs of Nausea or vomiting and Pain level controlled  Post-op Vital Signs: stable  Complications: No apparent anesthesia complications

## 2012-09-20 NOTE — Progress Notes (Signed)
Pt given D/C instructions with verbal feedback. Pt D/C'd home via wheelchair @ 0930 per MD order. Rema Fendt, RN

## 2012-09-20 NOTE — Discharge Summary (Signed)
Physician Discharge Summary  Patient ID: Anthony Hebert MRN: [id number] DOB/AGE: [birth year] 62 y.o.  Admit date: 09/19/2012 Discharge date: 09/20/2012  Admission Diagnoses:  Discharge Diagnoses:  Active Problems:  * No active hospital problems. *    Discharged Condition: good  Hospital Course: Surgery one day for removal of spinal stimulator. Home the next. No new issues. Ambulating fine. Wounds fine. Specific instructions given.  Consults: None  Significant Diagnostic Studies: none  Treatments: surgery: Spinal stimulator removal.  Discharge Exam: Blood pressure 112/69, pulse 79, temperature 98.4 F (36.9 C), temperature source Oral, resp. rate 16, SpO2 93.00%. Incision/Wound:clean and dry   Disposition: 06-Home-Health Care Svc  Discharge Orders    Future Orders Please Complete By Expires   Diet general      Discharge instructions      Comments:   Mostly bedrest. Get up 9 or 10 times each day and walk for 15-20 minutes each time. Very little sitting the first week. No riding in the car until your first post op appointment. If you had neck surgery...may shower from the chest down. If you had low back surgery....you may shower with a saran wrap covering over the incision. Take your pain medicine as needed...and other medicines that you are instructed to take. Call for an appointment...[phone].   Call MD for:  temperature >100.4      Call MD for:  persistant nausea and vomiting      Call MD for:  severe uncontrolled pain      Call MD for:  redness, tenderness, or signs of infection (pain, swelling, redness, odor or green/yellow discharge around incision site)      Call MD for:  difficulty breathing, headache or visual disturbances      Call MD for:  hives          Medication List     As of 09/20/2012  8:20 AM    TAKE these medications         allopurinol 300 MG tablet   Commonly known as: ZYLOPRIM   Take 300 mg by mouth daily.      HYDROcodone-acetaminophen 5-500 MG  per tablet   Commonly known as: VICODIN   Take 1-2 tablets by mouth every 4 (four) hours as needed. For pain      lisinopril-hydrochlorothiazide 20-12.5 MG per tablet   Commonly known as: PRINZIDE,ZESTORETIC   Take 1 tablet by mouth daily.      OVER THE COUNTER MEDICATION   Take 1 tablet by mouth daily as needed. For acid reflux. OTC      oxyCODONE-acetaminophen 5-325 MG per tablet   Commonly known as: PERCOCET/ROXICET   Take 1 tablet by mouth every 6 (six) hours as needed. For pain      simvastatin 40 MG tablet   Commonly known as: ZOCOR   Take 40 mg by mouth every evening.         At home rest most of the time. Get up 9 or 10 times each day and take a 15 or 20 minute walk. No riding in the car and to your first postoperative appointment. If you have neck surgery you may shower from the chest down starting on the third postoperative day. If you had back surgery he may start showering on the third postoperative day with saran wrap wrapped around your incisional area 3 times. After the shower remove the saran wrap. Take pain medicine as needed and other medications as instructed. Call my office for an appointment.  Signed:  Reinaldo Meeker, MD 09/20/2012, 8:20 AM

## 2012-09-23 ENCOUNTER — Encounter (HOSPITAL_COMMUNITY): Payer: Self-pay | Admitting: Neurosurgery

## 2013-01-07 ENCOUNTER — Other Ambulatory Visit: Payer: Self-pay | Admitting: Neurosurgery

## 2013-01-07 DIAGNOSIS — M542 Cervicalgia: Secondary | ICD-10-CM

## 2013-01-07 DIAGNOSIS — M545 Low back pain: Secondary | ICD-10-CM

## 2013-01-13 ENCOUNTER — Ambulatory Visit
Admission: RE | Admit: 2013-01-13 | Discharge: 2013-01-13 | Disposition: A | Discharge status: Home / Self Care | Payer: BC Managed Care – PPO | Source: Ambulatory Visit | Attending: Neurosurgery | Admitting: Neurosurgery

## 2013-01-13 DIAGNOSIS — M542 Cervicalgia: Secondary | ICD-10-CM

## 2013-01-13 DIAGNOSIS — M545 Low back pain: Secondary | ICD-10-CM

## 2013-01-13 MED ORDER — GADOBENATE DIMEGLUMINE 529 MG/ML IV SOLN
20.0000 mL | Freq: Once | INTRAVENOUS | Status: AC | PRN
  Administered 2013-01-13: 20 mL via INTRAVENOUS

## 2013-05-16 ENCOUNTER — Other Ambulatory Visit: Payer: Self-pay | Admitting: Neurosurgery

## 2013-05-16 DIAGNOSIS — M549 Dorsalgia, unspecified: Secondary | ICD-10-CM

## 2013-05-16 DIAGNOSIS — M542 Cervicalgia: Secondary | ICD-10-CM

## 2013-05-20 ENCOUNTER — Ambulatory Visit
Admission: RE | Admit: 2013-05-20 | Discharge: 2013-05-20 | Disposition: A | Discharge status: Home / Self Care | Payer: BC Managed Care – PPO | Source: Ambulatory Visit | Attending: Neurosurgery | Admitting: Neurosurgery

## 2013-05-20 VITALS — BP 107/76 | HR 67

## 2013-05-20 DIAGNOSIS — M542 Cervicalgia: Secondary | ICD-10-CM

## 2013-05-20 MED ORDER — DIAZEPAM 5 MG PO TABS
10.0000 mg | ORAL_TABLET | Freq: Once | ORAL | Status: AC
  Administered 2013-05-20: 10 mg via ORAL

## 2013-05-20 MED ORDER — IOHEXOL 300 MG/ML  SOLN
10.0000 mL | Freq: Once | INTRAMUSCULAR | Status: AC | PRN
  Administered 2013-05-20: 10 mL via INTRAVENOUS

## 2013-07-26 ENCOUNTER — Encounter: Payer: Self-pay | Admitting: *Deleted

## 2013-07-26 ENCOUNTER — Encounter: Payer: Self-pay | Admitting: Interventional Cardiology

## 2013-07-26 DIAGNOSIS — C801 Malignant (primary) neoplasm, unspecified: Secondary | ICD-10-CM | POA: Insufficient documentation

## 2013-07-26 DIAGNOSIS — I10 Essential (primary) hypertension: Secondary | ICD-10-CM | POA: Insufficient documentation

## 2013-07-26 DIAGNOSIS — R011 Cardiac murmur, unspecified: Secondary | ICD-10-CM | POA: Insufficient documentation

## 2013-07-26 DIAGNOSIS — K219 Gastro-esophageal reflux disease without esophagitis: Secondary | ICD-10-CM | POA: Insufficient documentation

## 2013-07-28 ENCOUNTER — Encounter: Payer: Self-pay | Admitting: Interventional Cardiology

## 2013-07-28 ENCOUNTER — Ambulatory Visit (INDEPENDENT_AMBULATORY_CARE_PROVIDER_SITE_OTHER): Payer: BC Managed Care – PPO | Admitting: Interventional Cardiology

## 2013-07-28 ENCOUNTER — Ambulatory Visit: Payer: BC Managed Care – PPO | Admitting: Interventional Cardiology

## 2013-07-28 VITALS — BP 120/70 | HR 92 | Ht 67.0 in | Wt 201.0 lb

## 2013-07-28 DIAGNOSIS — Z01810 Encounter for preprocedural cardiovascular examination: Secondary | ICD-10-CM

## 2013-07-28 DIAGNOSIS — I10 Essential (primary) hypertension: Secondary | ICD-10-CM

## 2013-07-28 DIAGNOSIS — I2510 Atherosclerotic heart disease of native coronary artery without angina pectoris: Secondary | ICD-10-CM

## 2013-07-28 DIAGNOSIS — E782 Mixed hyperlipidemia: Secondary | ICD-10-CM | POA: Insufficient documentation

## 2013-07-28 MED ORDER — ASPIRIN EC 81 MG PO TBEC: 81.0000 mg | DELAYED_RELEASE_TABLET | Freq: Every day | ORAL | Status: DC

## 2013-07-28 NOTE — Progress Notes (Signed)
Patient ID: Anthony Hebert, male   DOB: Aug 05, 1951, 62 y.o.   MRN: [id number]    [address] [place], [place]  [address] Phone: [phone] Fax:  [phone]  Date:  07/28/2013   ID:  Anthony Hebert, DOB Jul 30, 1951, MRN [id number]  PCP:  Anthony Hebert      History of Present Illness: Anthony Hebert is a 62 y.o. male who has a h/o MI about 20 years ago after back surgery. ECG showed the MI but he never had any other heart tests done. he does not recall having chest pain. He has been on meds for HTN and high cholesterol, for at least 10 years.  Walking while carrying things is the most strenuous activity. He has SHOB with this, while carrying a bag of corn. He is a taxidermist. No CP or pressure with activity. No smoking.  Had low back stimulator removed in 2/14. No cardiac issues.  Will need neck surgery in 2/15 for pinched nerve.     Wt Readings from Last 3 Encounters:  07/28/13 201 lb (91.173 kg)  09/17/12 204 lb 8 oz (92.761 kg)     Past Medical History  Diagnosis Date  . Difficult intubation     had trouble with intubation  . Heart murmur   . Hypertension   . GERD (gastroesophageal reflux disease)     occ  . Cancer     mole melanoma lft eye    Current Outpatient Prescriptions  Medication Sig Dispense Refill  . HYDROcodone-acetaminophen (VICODIN) 5-500 MG per tablet Take 1-2 tablets by mouth every 4 (four) hours as needed. For pain      . lisinopril-hydrochlorothiazide (PRINZIDE,ZESTORETIC) 20-12.5 MG per tablet Take 1 tablet by mouth daily.      Marland Kitchen OVER THE COUNTER MEDICATION Take 1 tablet by mouth daily as needed. For acid reflux. OTC      . oxyCODONE-acetaminophen (PERCOCET/ROXICET) 5-325 MG per tablet Take 1 tablet by mouth every 6 (six) hours as needed. For pain      . simvastatin (ZOCOR) 40 MG tablet Take 40 mg by mouth every evening.       No current facility-administered medications for this visit.    Allergies:   No Known Allergies  Social  History:  The patient  reports that he has never smoked. He does not have any smokeless tobacco history on file. He reports that he drinks about 1.8 ounces of alcohol per week. He reports that he does not use illicit drugs.   Family History:  The patient's family history includes Alzheimer's disease in his father; Cancer in his mother.   ROS:  Please see the history of present illness.  No nausea, vomiting.  No fevers, chills.  No focal weakness.  No dysuria.   All other systems reviewed and negative.   PHYSICAL EXAM: VS:  BP 120/70  Pulse 92  Ht 5\' 7"  (1.702 m)  Wt 201 lb (91.173 kg)  BMI 31.47 kg/m2 Well nourished, well developed, in no acute distress HEENT: normal Neck: no JVD, no carotid bruits Cardiac:  normal S1, S2; RRR;  Lungs:  clear to auscultation bilaterally, no wheezing, rhonchi or rales Abd: soft, nontender, no hepatomegaly Ext: no edema Skin: warm and dry Neuro:   no focal abnormalities noted  EKG:  NSR, no ST segment changes     ASSESSMENT AND PLAN:  Heart Murmur  Diagnostic Imaging:[facility] Echocardiogram (Ordered for 07/26/2012) Soft systolic murmur. Echo in 12/13 showed aortic  sclerosis and normal LV function.   No significant abnormalities.  2. Hypertension, essential  Continue Lisinopril-Hydrochlorothiazide Tablet, 20-12.5 MG, 1 tablet, Orally, Once a day Is well controlled.  3. Observation for suspected cardiovascular disease  Diagnostic Imaging:EKG Anthony Hebert 07/26/2012 10:00:00 AM > Anthony Hebert,Anthony Hebert 07/26/2012 10:34:09 AM > NSR, isolated Q wave in lead 3, no ST segment changes  COntinue risk factor modification.  4. Preop evalutaion: He walks without any cardiac sx.  He goes up a flight of stairs several times a day without problems.  No further cardiac testing needed prior to surgery.  Echo last year showed normal LV function. No regional wall motion abnormalities despite his history of MI about 20 years ago.  5. Hyperlipidemia: Simvastatin to keep LDL below  100. Last LDL 105 in 6/14.  Decrease fried food intake.  Try to walk at least 30 minutes a day 5 days a week.  Hopefully, he can increase when some of his spine abnormalities are improved.  Signed, Anthony Mare, MD, [facility] 07/28/2013 3:12 PM

## 2013-07-28 NOTE — Patient Instructions (Signed)
Your physician wants you to follow-up in: 1 year with Dr. Eldridge Dace. You will receive a reminder letter in the mail two months in advance. If you don't receive a letter, please call our office to schedule the follow-up appointment.  Your physician has recommended you make the following change in your medication:   1. Start Aspirin 81 mg 1 tablet by mouth daily.

## 2013-09-08 ENCOUNTER — Other Ambulatory Visit: Payer: Self-pay | Admitting: Neurosurgery

## 2013-09-27 NOTE — Pre-Procedure Instructions (Signed)
[facility] - Preparing for Surgery ° °Before surgery, you can play an important role.  Because skin is not sterile, your skin needs to be as free of germs as possible.  You can reduce the number of germs on you skin by washing with CHG (chlorahexidine gluconate) soap before surgery.  CHG is an antiseptic cleaner which kills germs and bonds with the skin to continue killing germs even after washing. ° °Please DO NOT use if you have an allergy to CHG or antibacterial soaps.  If your skin becomes reddened/irritated stop using the CHG and inform your nurse when you arrive at Short Stay. ° °Do not shave (including legs and underarms) for at least 48 hours prior to the first CHG shower.  You may shave your face. ° °Please follow these instructions carefully: ° ° 1.  Shower with CHG Soap the night before surgery and the                                morning of Surgery. ° 2.  If you choose to wash your hair, wash your hair first as usual with your       normal shampoo. ° 3.  After you shampoo, rinse your hair and body thoroughly to remove the                      Shampoo. ° 4.  Use CHG as you would any other liquid soap.  You can apply chg directly       to the skin and wash gently with scrungie or a clean washcloth. ° 5.  Apply the CHG Soap to your body ONLY FROM THE NECK DOWN.        Do not use on open wounds or open sores.  Avoid contact with your eyes,       ears, mouth and genitals (private parts).  Wash genitals (private parts)       with your normal soap. ° 6.  Wash thoroughly, paying special attention to the area where your surgery        will be performed. ° 7.  Thoroughly rinse your body with warm water from the neck down. ° 8.  DO NOT shower/wash with your normal soap after using and rinsing off       the CHG Soap. ° 9.  Pat yourself dry with a clean towel. °           10.  Wear clean pajamas. °           11.  Place clean sheets on your bed the night of your first shower and do not        sleep with  pets. ° °Day of Surgery ° °Do not apply any lotions/deoderants the morning of surgery.  Please wear clean clothes to the hospital/surgery center. ° ° °

## 2013-09-27 NOTE — Pre-Procedure Instructions (Signed)
Anthony Hebert  09/27/2013   Your procedure is scheduled on:  February 24  Report to [facility] Entrance "A" [address] at [employer] AM.  Call this number if you have problems the morning of surgery: [phone]   Remember:   Do not eat food or drink liquids after midnight.   Take these medicines the morning of surgery with A SIP OF WATER: Hydrocodone or Oxycodone (if needed), Acid Reflux medication,   STOP Aspirin today   STOP/ Do not take Aspirin, Aleve, Naproxen, Advil, Ibuprofen, Vitamin, Herbs, or Supplements starting today   Do not wear jewelry, make-up or nail polish.  Do not wear lotions, powders, or cologne. You may wear deodorant.  Men may shave face and neck.  Do not bring valuables to the hospital.  [facility] is not responsible                  for any belongings or valuables.               Contacts, dentures or bridgework may not be worn into surgery.  Leave suitcase in the car. After surgery it may be brought to your room.  For patients admitted to the hospital, discharge time is determined by your                treatment team.               Patients discharged the day of surgery will not be allowed to drive  home.  Name and phone number of your driver: Family/ Friend  Special Instructions: See [facility] Preparing For Surgery   Please read over the following fact sheets that you were given: Pain Booklet, Coughing and Deep Breathing and Surgical Site Infection Prevention

## 2013-09-29 ENCOUNTER — Ambulatory Visit (HOSPITAL_COMMUNITY)
Admission: RE | Admit: 2013-09-29 | Discharge: 2013-09-29 | Disposition: A | Discharge status: Home / Self Care | Payer: BC Managed Care – PPO | Source: Ambulatory Visit | Attending: Neurosurgery | Admitting: Neurosurgery

## 2013-09-29 ENCOUNTER — Encounter (HOSPITAL_COMMUNITY)
Admission: RE | Admit: 2013-09-29 | Discharge: 2013-09-29 | Disposition: A | Discharge status: Home / Self Care | Payer: BC Managed Care – PPO | Source: Ambulatory Visit | Attending: Neurosurgery | Admitting: Neurosurgery

## 2013-09-29 ENCOUNTER — Encounter (HOSPITAL_COMMUNITY): Payer: Self-pay

## 2013-09-29 DIAGNOSIS — Z01812 Encounter for preprocedural laboratory examination: Secondary | ICD-10-CM | POA: Diagnosis not present

## 2013-09-29 DIAGNOSIS — Z01818 Encounter for other preprocedural examination: Secondary | ICD-10-CM | POA: Insufficient documentation

## 2013-09-29 DIAGNOSIS — Z01810 Encounter for preprocedural cardiovascular examination: Secondary | ICD-10-CM | POA: Insufficient documentation

## 2013-09-29 HISTORY — DX: Other specified soft tissue disorders: M79.89

## 2013-09-29 LAB — CBC
HCT: 38.2 % — ABNORMAL LOW (ref 39.0–52.0)
Hemoglobin: 13.4 g/dL (ref 13.0–17.0)
MCH: 31.9 pg (ref 26.0–34.0)
MCHC: 35.1 g/dL (ref 30.0–36.0)
MCV: 91 fL (ref 78.0–100.0)
Platelets: 170 10*3/uL (ref 150–400)
RBC: 4.2 MIL/uL — ABNORMAL LOW (ref 4.22–5.81)
RDW: 12.6 % (ref 11.5–15.5)
WBC: 8.7 10*3/uL (ref 4.0–10.5)

## 2013-09-29 LAB — BASIC METABOLIC PANEL WITH GFR
BUN: 22 mg/dL (ref 6–23)
CO2: 26 meq/L (ref 19–32)
Calcium: 9.5 mg/dL (ref 8.4–10.5)
Chloride: 101 meq/L (ref 96–112)
Creatinine, Ser: 1.32 mg/dL (ref 0.50–1.35)
GFR calc Af Amer: 66 mL/min — ABNORMAL LOW
GFR calc non Af Amer: 57 mL/min — ABNORMAL LOW
Glucose, Bld: 120 mg/dL — ABNORMAL HIGH (ref 70–99)
Potassium: 4.1 meq/L (ref 3.7–5.3)
Sodium: 142 meq/L (ref 137–147)

## 2013-09-29 LAB — SURGICAL PCR SCREEN
MRSA, PCR: NEGATIVE
STAPHYLOCOCCUS AUREUS: NEGATIVE

## 2013-09-29 NOTE — Progress Notes (Signed)
Anthony Hebert to review hx of intubation issues. Also has swollen rt foot, pt does not know why.

## 2013-09-29 NOTE — Progress Notes (Signed)
Anesthesia Chart Review: Patient is a 63 year old male scheduled for posterior cervical laminectomy on 10/07/13 by Dr. Hal Neer.  See my note from 09/19/12 regarding cardiac and previous anesthesia history--and refer to anesthesia records in Epic from 09/19/12. Of note, he saw cardiologist Dr. Irish Lack on 07/28/13, and no further cardiac testing was recommended prior to surgery.  Preoperative CXR, labs, and EKG noted. Echo on 08/02/12 showed LVEF 60-65%, aortic valve sclerosis but opens well. Trivial TR.  Anticipate that he can proceed as planned.  Definitive anesthesia plan per his assigned anesthesiologist.   Myra Gianotti, PA-C [facility] Short Stay Center/Anesthesiology Phone [phone] 09/29/2013 5:01 PM

## 2013-10-06 MED ORDER — DEXAMETHASONE SODIUM PHOSPHATE 10 MG/ML IJ SOLN
10.0000 mg | INTRAMUSCULAR | Status: AC
  Administered 2013-10-07: 10 mg via INTRAVENOUS
  Filled 2013-10-06: qty 1

## 2013-10-06 MED ORDER — CEFAZOLIN SODIUM-DEXTROSE 2-3 GM-% IV SOLR
2.0000 g | INTRAVENOUS | Status: AC
  Administered 2013-10-07: 2 g via INTRAVENOUS
  Filled 2013-10-06: qty 50

## 2013-10-07 ENCOUNTER — Ambulatory Visit (HOSPITAL_COMMUNITY): Payer: BC Managed Care – PPO | Admitting: Certified Registered"

## 2013-10-07 ENCOUNTER — Encounter (HOSPITAL_COMMUNITY): Payer: BC Managed Care – PPO | Admitting: Vascular Surgery

## 2013-10-07 ENCOUNTER — Encounter (HOSPITAL_COMMUNITY): Payer: Self-pay

## 2013-10-07 ENCOUNTER — Encounter (HOSPITAL_COMMUNITY)
Admission: RE | Disposition: A | Discharge status: Home / Self Care | Payer: Self-pay | Source: Ambulatory Visit | Attending: Neurosurgery

## 2013-10-07 ENCOUNTER — Ambulatory Visit (HOSPITAL_COMMUNITY)
Admission: RE | Admit: 2013-10-07 | Discharge: 2013-10-08 | Disposition: A | Discharge status: Home / Self Care | Payer: BC Managed Care – PPO | Source: Ambulatory Visit | Attending: Neurosurgery | Admitting: Neurosurgery

## 2013-10-07 ENCOUNTER — Ambulatory Visit (HOSPITAL_COMMUNITY): Payer: BC Managed Care – PPO

## 2013-10-07 DIAGNOSIS — R011 Cardiac murmur, unspecified: Secondary | ICD-10-CM | POA: Insufficient documentation

## 2013-10-07 DIAGNOSIS — M4722 Other spondylosis with radiculopathy, cervical region: Secondary | ICD-10-CM

## 2013-10-07 DIAGNOSIS — Z85820 Personal history of malignant melanoma of skin: Secondary | ICD-10-CM | POA: Insufficient documentation

## 2013-10-07 DIAGNOSIS — M47812 Spondylosis without myelopathy or radiculopathy, cervical region: Secondary | ICD-10-CM | POA: Insufficient documentation

## 2013-10-07 DIAGNOSIS — I10 Essential (primary) hypertension: Secondary | ICD-10-CM | POA: Insufficient documentation

## 2013-10-07 DIAGNOSIS — K219 Gastro-esophageal reflux disease without esophagitis: Secondary | ICD-10-CM | POA: Insufficient documentation

## 2013-10-07 HISTORY — PX: POSTERIOR CERVICAL LAMINECTOMY: SHX2248

## 2013-10-07 SURGERY — POSTERIOR CERVICAL LAMINECTOMY
Anesthesia: General | Laterality: Right

## 2013-10-07 MED ORDER — HYDROMORPHONE HCL PF 1 MG/ML IJ SOLN
INTRAMUSCULAR | Status: AC
  Filled 2013-10-07: qty 1

## 2013-10-07 MED ORDER — HYDROMORPHONE HCL PF 1 MG/ML IJ SOLN
0.2500 mg | INTRAMUSCULAR | Status: DC | PRN
  Administered 2013-10-07 (×2): 0.5 mg via INTRAVENOUS

## 2013-10-07 MED ORDER — HYDROCHLOROTHIAZIDE 12.5 MG PO CAPS
12.5000 mg | ORAL_CAPSULE | Freq: Every day | ORAL | Status: DC
  Administered 2013-10-08: 12.5 mg via ORAL
  Filled 2013-10-07: qty 1

## 2013-10-07 MED ORDER — 0.9 % SODIUM CHLORIDE (POUR BTL) OPTIME
TOPICAL | Status: DC | PRN
  Administered 2013-10-07: 1000 mL

## 2013-10-07 MED ORDER — LIDOCAINE HCL (CARDIAC) 20 MG/ML IV SOLN
INTRAVENOUS | Status: AC
  Filled 2013-10-07: qty 5

## 2013-10-07 MED ORDER — HYDROCODONE-ACETAMINOPHEN 5-325 MG PO TABS
1.0000 | ORAL_TABLET | ORAL | Status: DC | PRN
  Administered 2013-10-07 – 2013-10-08 (×3): 2 via ORAL
  Filled 2013-10-07 (×3): qty 2

## 2013-10-07 MED ORDER — GLYCOPYRROLATE 0.2 MG/ML IJ SOLN
INTRAMUSCULAR | Status: DC | PRN
  Administered 2013-10-07: 0.4 mg via INTRAVENOUS

## 2013-10-07 MED ORDER — DEXAMETHASONE SODIUM PHOSPHATE 4 MG/ML IJ SOLN: 4.0000 mg | Freq: Four times a day (QID) | INTRAMUSCULAR | Status: AC

## 2013-10-07 MED ORDER — ACETAMINOPHEN 325 MG PO TABS: 650.0000 mg | ORAL_TABLET | ORAL | Status: DC | PRN

## 2013-10-07 MED ORDER — PROPOFOL 10 MG/ML IV BOLUS
INTRAVENOUS | Status: DC | PRN
  Administered 2013-10-07: 200 mg via INTRAVENOUS

## 2013-10-07 MED ORDER — SIMVASTATIN 40 MG PO TABS
40.0000 mg | ORAL_TABLET | Freq: Every evening | ORAL | Status: DC
  Administered 2013-10-07: 40 mg via ORAL
  Filled 2013-10-07 (×2): qty 1

## 2013-10-07 MED ORDER — ONDANSETRON HCL 4 MG/2ML IJ SOLN
INTRAMUSCULAR | Status: AC
  Filled 2013-10-07: qty 2

## 2013-10-07 MED ORDER — OXYCODONE HCL 5 MG/5ML PO SOLN: 5.0000 mg | Freq: Once | ORAL | Status: AC | PRN

## 2013-10-07 MED ORDER — MIDAZOLAM HCL 2 MG/2ML IJ SOLN
INTRAMUSCULAR | Status: AC
Start: 2013-10-07 — End: 2013-10-07
  Filled 2013-10-07: qty 2

## 2013-10-07 MED ORDER — BACITRACIN ZINC 500 UNIT/GM EX OINT
TOPICAL_OINTMENT | CUTANEOUS | Status: DC | PRN
  Administered 2013-10-07: 1 via TOPICAL

## 2013-10-07 MED ORDER — PHENOL 1.4 % MT LIQD: 1.0000 | OROMUCOSAL | Status: DC | PRN

## 2013-10-07 MED ORDER — THROMBIN 5000 UNITS EX SOLR
CUTANEOUS | Status: DC | PRN
  Administered 2013-10-07 (×2): 5000 [IU] via TOPICAL

## 2013-10-07 MED ORDER — MIDAZOLAM HCL 5 MG/5ML IJ SOLN
INTRAMUSCULAR | Status: DC | PRN
  Administered 2013-10-07: 2 mg via INTRAVENOUS

## 2013-10-07 MED ORDER — LIDOCAINE HCL (CARDIAC) 20 MG/ML IV SOLN
INTRAVENOUS | Status: DC | PRN
  Administered 2013-10-07: 100 mg via INTRAVENOUS

## 2013-10-07 MED ORDER — BUPIVACAINE HCL (PF) 0.5 % IJ SOLN
INTRAMUSCULAR | Status: DC | PRN
  Administered 2013-10-07: 20 mL

## 2013-10-07 MED ORDER — OXYCODONE HCL 5 MG PO TABS
ORAL_TABLET | ORAL | Status: AC
  Filled 2013-10-07: qty 1

## 2013-10-07 MED ORDER — STERILE WATER FOR INJECTION IJ SOLN
INTRAMUSCULAR | Status: AC
  Filled 2013-10-07: qty 10

## 2013-10-07 MED ORDER — HEMOSTATIC AGENTS (NO CHARGE) OPTIME
TOPICAL | Status: DC | PRN
  Administered 2013-10-07: 1 via TOPICAL

## 2013-10-07 MED ORDER — KETOROLAC TROMETHAMINE 30 MG/ML IJ SOLN
INTRAMUSCULAR | Status: DC | PRN
  Administered 2013-10-07: 30 mg via INTRAVENOUS

## 2013-10-07 MED ORDER — EPHEDRINE SULFATE 50 MG/ML IJ SOLN
INTRAMUSCULAR | Status: DC | PRN
  Administered 2013-10-07 (×2): 10 mg via INTRAVENOUS

## 2013-10-07 MED ORDER — NEOSTIGMINE METHYLSULFATE 1 MG/ML IJ SOLN
INTRAMUSCULAR | Status: AC
  Filled 2013-10-07: qty 10

## 2013-10-07 MED ORDER — SODIUM CHLORIDE 0.9 % IJ SOLN
3.0000 mL | Freq: Two times a day (BID) | INTRAMUSCULAR | Status: DC
  Administered 2013-10-07: 3 mL via INTRAVENOUS

## 2013-10-07 MED ORDER — SUCCINYLCHOLINE CHLORIDE 20 MG/ML IJ SOLN
INTRAMUSCULAR | Status: DC | PRN
  Administered 2013-10-07: 100 mg via INTRAVENOUS

## 2013-10-07 MED ORDER — KCL IN DEXTROSE-NACL 20-5-0.45 MEQ/L-%-% IV SOLN
80.0000 mL/h | INTRAVENOUS | Status: DC
  Filled 2013-10-07 (×3): qty 1000

## 2013-10-07 MED ORDER — ROCURONIUM BROMIDE 50 MG/5ML IV SOLN
INTRAVENOUS | Status: AC
  Filled 2013-10-07: qty 1

## 2013-10-07 MED ORDER — NEOSTIGMINE METHYLSULFATE 1 MG/ML IJ SOLN
INTRAMUSCULAR | Status: DC | PRN
  Administered 2013-10-07: 3 mg via INTRAVENOUS

## 2013-10-07 MED ORDER — PROPOFOL 10 MG/ML IV BOLUS
INTRAVENOUS | Status: AC
  Filled 2013-10-07: qty 20

## 2013-10-07 MED ORDER — SUFENTANIL CITRATE 50 MCG/ML IV SOLN
INTRAVENOUS | Status: AC
Start: 2013-10-07 — End: 2013-10-07
  Filled 2013-10-07: qty 1

## 2013-10-07 MED ORDER — CYCLOBENZAPRINE HCL 10 MG PO TABS
10.0000 mg | ORAL_TABLET | Freq: Three times a day (TID) | ORAL | Status: DC | PRN
  Administered 2013-10-07 (×2): 10 mg via ORAL
  Filled 2013-10-07: qty 1

## 2013-10-07 MED ORDER — SUFENTANIL CITRATE 50 MCG/ML IV SOLN
INTRAVENOUS | Status: DC | PRN
  Administered 2013-10-07: 20 ug via INTRAVENOUS
  Administered 2013-10-07: 10 ug via INTRAVENOUS

## 2013-10-07 MED ORDER — ROCURONIUM BROMIDE 100 MG/10ML IV SOLN
INTRAVENOUS | Status: DC | PRN
  Administered 2013-10-07: 30 mg via INTRAVENOUS

## 2013-10-07 MED ORDER — ONDANSETRON HCL 4 MG/2ML IJ SOLN: 4.0000 mg | INTRAMUSCULAR | Status: DC | PRN

## 2013-10-07 MED ORDER — ONDANSETRON HCL 4 MG/2ML IJ SOLN
INTRAMUSCULAR | Status: DC | PRN
  Administered 2013-10-07: 4 mg via INTRAVENOUS

## 2013-10-07 MED ORDER — OXYCODONE HCL 5 MG PO TABS
5.0000 mg | ORAL_TABLET | Freq: Once | ORAL | Status: AC | PRN
  Administered 2013-10-07: 5 mg via ORAL

## 2013-10-07 MED ORDER — DEXAMETHASONE 4 MG PO TABS
4.0000 mg | ORAL_TABLET | Freq: Four times a day (QID) | ORAL | Status: AC
  Administered 2013-10-07 (×2): 4 mg via ORAL

## 2013-10-07 MED ORDER — GLYCOPYRROLATE 0.2 MG/ML IJ SOLN
INTRAMUSCULAR | Status: AC
  Filled 2013-10-07: qty 2

## 2013-10-07 MED ORDER — ONDANSETRON HCL 4 MG/2ML IJ SOLN
4.0000 mg | Freq: Once | INTRAMUSCULAR | Status: AC | PRN
  Administered 2013-10-07: 4 mg via INTRAVENOUS

## 2013-10-07 MED ORDER — LISINOPRIL-HYDROCHLOROTHIAZIDE 20-12.5 MG PO TABS: 1.0000 | ORAL_TABLET | Freq: Every day | ORAL | Status: DC

## 2013-10-07 MED ORDER — MEPERIDINE HCL 25 MG/ML IJ SOLN: 6.2500 mg | INTRAMUSCULAR | Status: DC | PRN

## 2013-10-07 MED ORDER — CYCLOBENZAPRINE HCL 10 MG PO TABS
ORAL_TABLET | ORAL | Status: AC
  Filled 2013-10-07: qty 1

## 2013-10-07 MED ORDER — CEFAZOLIN SODIUM-DEXTROSE 2-3 GM-% IV SOLR
2.0000 g | Freq: Three times a day (TID) | INTRAVENOUS | Status: AC
  Administered 2013-10-07 (×2): 2 g via INTRAVENOUS
  Filled 2013-10-07 (×2): qty 50

## 2013-10-07 MED ORDER — SODIUM CHLORIDE 0.9 % IJ SOLN: 3.0000 mL | INTRAMUSCULAR | Status: DC | PRN

## 2013-10-07 MED ORDER — MENTHOL 3 MG MT LOZG: 1.0000 | LOZENGE | OROMUCOSAL | Status: DC | PRN

## 2013-10-07 MED ORDER — VECURONIUM BROMIDE 10 MG IV SOLR
INTRAVENOUS | Status: AC
Start: 2013-10-07 — End: 2013-10-07
  Filled 2013-10-07: qty 10

## 2013-10-07 MED ORDER — LISINOPRIL 20 MG PO TABS
20.0000 mg | ORAL_TABLET | Freq: Every day | ORAL | Status: DC
  Administered 2013-10-08: 20 mg via ORAL
  Filled 2013-10-07: qty 1

## 2013-10-07 MED ORDER — HYDROMORPHONE HCL PF 1 MG/ML IJ SOLN: 1.0000 mg | INTRAMUSCULAR | Status: DC | PRN

## 2013-10-07 MED ORDER — LACTATED RINGERS IV SOLN
INTRAVENOUS | Status: DC | PRN
  Administered 2013-10-07 (×2): via INTRAVENOUS

## 2013-10-07 MED ORDER — ACETAMINOPHEN 650 MG RE SUPP: 650.0000 mg | RECTAL | Status: DC | PRN

## 2013-10-07 MED ORDER — KETOROLAC TROMETHAMINE 30 MG/ML IJ SOLN
30.0000 mg | Freq: Four times a day (QID) | INTRAMUSCULAR | Status: DC
  Administered 2013-10-07 – 2013-10-08 (×3): 30 mg via INTRAVENOUS
  Filled 2013-10-07 (×7): qty 1

## 2013-10-07 MED ORDER — SODIUM CHLORIDE 0.9 % IV SOLN: 250.0000 mL | INTRAVENOUS | Status: DC

## 2013-10-07 MED ORDER — KETOROLAC TROMETHAMINE 30 MG/ML IJ SOLN
INTRAMUSCULAR | Status: AC
  Filled 2013-10-07: qty 1

## 2013-10-07 SURGICAL SUPPLY — 54 items
BAG DECANTER FOR FLEXI CONT (MISCELLANEOUS) ×3 IMPLANT
BENZOIN TINCTURE PRP APPL 2/3 (GAUZE/BANDAGES/DRESSINGS) ×9 IMPLANT
BLADE SURG 11 STRL SS (BLADE) ×3 IMPLANT
BLADE SURG ROTATE 9660 (MISCELLANEOUS) ×3 IMPLANT
BNDG COHESIVE 4X5 WHT NS (GAUZE/BANDAGES/DRESSINGS) ×3 IMPLANT
BRUSH SCRUB EZ PLAIN DRY (MISCELLANEOUS) ×3 IMPLANT
BUR MATCHSTICK NEURO 3.0 LAGG (BURR) ×3 IMPLANT
CANISTER SUCT 3000ML (MISCELLANEOUS) ×3 IMPLANT
CLOSURE WOUND 1/2 X4 (GAUZE/BANDAGES/DRESSINGS) ×2
CONT SPEC 4OZ CLIKSEAL STRL BL (MISCELLANEOUS) ×3 IMPLANT
DERMABOND ADVANCED (GAUZE/BANDAGES/DRESSINGS) ×2
DERMABOND ADVANCED .7 DNX12 (GAUZE/BANDAGES/DRESSINGS) ×1 IMPLANT
DRAPE LAPAROTOMY 100X72 PEDS (DRAPES) ×3 IMPLANT
DRAPE MICROSCOPE ZEISS OPMI (DRAPES) ×3 IMPLANT
DRAPE SURG 17X23 STRL (DRAPES) ×6 IMPLANT
DRESSING TELFA 8X3 (GAUZE/BANDAGES/DRESSINGS) IMPLANT
DRSG OPSITE POSTOP 4X6 (GAUZE/BANDAGES/DRESSINGS) ×3 IMPLANT
DURAPREP 26ML APPLICATOR (WOUND CARE) ×3 IMPLANT
ELECT REM PT RETURN 9FT ADLT (ELECTROSURGICAL) ×3
ELECTRODE REM PT RTRN 9FT ADLT (ELECTROSURGICAL) ×1 IMPLANT
GAUZE SPONGE 4X4 16PLY XRAY LF (GAUZE/BANDAGES/DRESSINGS) IMPLANT
GLOVE BIO SURGEON STRL SZ8 (GLOVE) ×3 IMPLANT
GLOVE ECLIPSE 8.0 STRL XLNG CF (GLOVE) ×6 IMPLANT
GLOVE EXAM NITRILE LRG STRL (GLOVE) ×3 IMPLANT
GLOVE EXAM NITRILE XL STR (GLOVE) IMPLANT
GLOVE EXAM NITRILE XS STR PU (GLOVE) IMPLANT
GLOVE INDICATOR 7.5 STRL GRN (GLOVE) ×3 IMPLANT
GLOVE SURG SS PI 7.0 STRL IVOR (GLOVE) ×3 IMPLANT
GOWN BRE IMP SLV AUR LG STRL (GOWN DISPOSABLE) IMPLANT
GOWN BRE IMP SLV AUR XL STRL (GOWN DISPOSABLE) IMPLANT
GOWN STRL REIN 2XL LVL4 (GOWN DISPOSABLE) IMPLANT
GOWN STRL REUS W/ TWL LRG LVL3 (GOWN DISPOSABLE) ×3 IMPLANT
GOWN STRL REUS W/ TWL XL LVL3 (GOWN DISPOSABLE) ×1 IMPLANT
GOWN STRL REUS W/TWL LRG LVL3 (GOWN DISPOSABLE) ×6
GOWN STRL REUS W/TWL XL LVL3 (GOWN DISPOSABLE) ×2
KIT BASIN OR (CUSTOM PROCEDURE TRAY) ×3 IMPLANT
KIT ROOM TURNOVER OR (KITS) ×3 IMPLANT
NEEDLE HYPO 22GX1.5 SAFETY (NEEDLE) ×3 IMPLANT
NEEDLE SPNL 20GX3.5 QUINCKE YW (NEEDLE) ×3 IMPLANT
NS IRRIG 1000ML POUR BTL (IV SOLUTION) ×3 IMPLANT
PACK LAMINECTOMY NEURO (CUSTOM PROCEDURE TRAY) ×3 IMPLANT
PAD ARMBOARD 7.5X6 YLW CONV (MISCELLANEOUS) ×3 IMPLANT
PAD EYE OVAL STERILE LF (GAUZE/BANDAGES/DRESSINGS) IMPLANT
PATTIES SURGICAL .25X.25 (GAUZE/BANDAGES/DRESSINGS) IMPLANT
RUBBERBAND STERILE (MISCELLANEOUS) ×6 IMPLANT
SPONGE GAUZE 4X4 12PLY (GAUZE/BANDAGES/DRESSINGS) ×3 IMPLANT
SPONGE SURGIFOAM ABS GEL SZ50 (HEMOSTASIS) ×3 IMPLANT
STAPLER SKIN PROX WIDE 3.9 (STAPLE) ×3 IMPLANT
STRIP CLOSURE SKIN 1/2X4 (GAUZE/BANDAGES/DRESSINGS) ×4 IMPLANT
SUT VIC AB 2-0 OS6 18 (SUTURE) ×9 IMPLANT
SUT VIC AB 3-0 CP2 18 (SUTURE) ×3 IMPLANT
TOWEL OR 17X24 6PK STRL BLUE (TOWEL DISPOSABLE) ×3 IMPLANT
TOWEL OR 17X26 10 PK STRL BLUE (TOWEL DISPOSABLE) ×3 IMPLANT
WATER STERILE IRR 1000ML POUR (IV SOLUTION) ×3 IMPLANT

## 2013-10-07 NOTE — Anesthesia Preprocedure Evaluation (Addendum)
Anesthesia Evaluation  Patient identified by MRN, date of birth, ID band Patient awake    Reviewed: Allergy & Precautions, H&P , NPO status , Patient's Chart, lab work & pertinent test results  History of Anesthesia Complications (+) DIFFICULT AIRWAY  Airway Mallampati: II TM Distance: >3 FB Neck ROM: Full    Dental  (+) Teeth Intact, Dental Advisory Given   Pulmonary          Cardiovascular hypertension, Pt. on medications     Neuro/Psych    GI/Hepatic GERD-  Medicated and Controlled,  Endo/Other    Renal/GU      Musculoskeletal   Abdominal   Peds  Hematology   Anesthesia Other Findings   Reproductive/Obstetrics                         Anesthesia Physical Anesthesia Plan  ASA: II  Anesthesia Plan: General   Post-op Pain Management:    Induction: Intravenous  Airway Management Planned: Oral ETT  Additional Equipment:   Intra-op Plan:   Post-operative Plan: Extubation in OR  Informed Consent: I have reviewed the patients History and Physical, chart, labs and discussed the procedure including the risks, benefits and alternatives for the proposed anesthesia with the patient or authorized representative who has indicated his/her understanding and acceptance.   Dental advisory given  Plan Discussed with: CRNA, Surgeon and Anesthesiologist  Anesthesia Plan Comments:        Anesthesia Quick Evaluation

## 2013-10-07 NOTE — Anesthesia Procedure Notes (Signed)
Procedure Name: Intubation Date/Time: 10/07/2013 7:47 AM Performed by: Melina Copa, Neira Bentsen R Pre-anesthesia Checklist: Patient identified, Emergency Drugs available, Suction available, Patient being monitored and Timeout performed Patient Re-evaluated:Patient Re-evaluated prior to inductionOxygen Delivery Method: Circle system utilized Preoxygenation: Pre-oxygenation with 100% oxygen Intubation Type: IV induction Ventilation: Mask ventilation without difficulty Grade View: Grade II Tube type: Oral Tube size: 8.0 mm Number of attempts: 1 Airway Equipment and Method: Rigid stylet and Video-laryngoscopy Placement Confirmation: ETT inserted through vocal cords under direct vision,  positive ETCO2 and breath sounds checked- equal and bilateral Secured at: 22 cm Tube secured with: Tape Dental Injury: Teeth and Oropharynx as per pre-operative assessment  Difficulty Due To: Difficulty was anticipated Future Recommendations: Recommend- induction with short-acting agent, and alternative techniques readily available

## 2013-10-07 NOTE — Preoperative (Signed)
Beta Blockers   Reason not to administer Beta Blockers:Not Applicable 

## 2013-10-07 NOTE — H&P (Signed)
Anthony Hebert is an 63 y.o. male.   Chief Complaint: Right arm pain with weakness and numbness HPI: The patient is 63 year old gentleman who is had numerous low back procedures in the past. He is done fairly well each time the Hasson chronic pain. Recently he developed right arm tingling with weakness. He's been tried on aggressive conservative therapy without improvement. He underwent myelography which showed foraminal stenosis at C7-T1 on the right side which did well with his clinical presentation. After failing further conservative therapy the patient requested surgery and now comes for a right C7-T1 foraminotomy with decompression of the C8 nerve root. I've had a long discussion with him regarding the risks and benefits of surgical intervention. The risks discussed include but are not limited to bleeding infection weakness numbness paralysis spinal fluid leak coma and death. We have discussed alternative methods of therapy offered risks and benefits of nonintervention. He's had the opportunity to ask numerous questions and appears to understand. With this information in hand he has requested we proceed with surgery.  Past Medical History  Diagnosis Date  . Heart murmur   . Hypertension   . GERD (gastroesophageal reflux disease)     occ  . Cancer     mole melanoma lft eye  . Difficult intubation     had trouble with intubation    shape of throat  . Foot swelling     in [facility] 07/2013    Past Surgical History  Procedure Laterality Date  . Hernia repair  30    lft  . Back surgery      x7 plus stimulator  . Tonsillectomy    . Foot surgery      cut nerve to relieve pain  . Spinal cord stimulator removal N/A 09/19/2012    Procedure: LUMBAR SPINAL CORD STIMULATOR REMOVAL;  Surgeon: Faythe Ghee, MD;  Location: [facility];  Service: Neurosurgery;  Laterality: N/A;    Family History  Problem Relation Age of Onset  . Alzheimer's disease Father   . Cancer Mother    Social History:   reports that he has never smoked. He does not have any smokeless tobacco history on file. He reports that he drinks about 1.8 ounces of alcohol per week. He reports that he does not use illicit drugs.  Allergies: No Known Allergies  Medications Prior to Admission  Medication Sig Dispense Refill  . HYDROcodone-acetaminophen (VICODIN) 5-500 MG per tablet Take 1-2 tablets by mouth every 4 (four) hours as needed. For pain      . lisinopril-hydrochlorothiazide (PRINZIDE,ZESTORETIC) 20-12.5 MG per tablet Take 1 tablet by mouth daily.      Marland Kitchen OVER THE COUNTER MEDICATION Take 1 tablet by mouth daily as needed. For acid reflux. OTC      . oxyCODONE-acetaminophen (PERCOCET/ROXICET) 5-325 MG per tablet Take 1 tablet by mouth every 6 (six) hours as needed. For pain      . simvastatin (ZOCOR) 40 MG tablet Take 40 mg by mouth every evening.        No results found for this or any previous visit (from the past 48 hour(s)). No results found.  Review of systems not obtained due to patient factors.  Blood pressure 141/76, pulse 76, temperature 97.8 F (36.6 C), temperature source Oral, resp. rate 20, SpO2 98.00%.  The patient is awake or and oriented. His no facial asymmetry. His gait is nonantalgic. Reflexes are decreased but equal. Distal weakness of the intrinsic muscles of his right hand and  some numbness on the ulnar aspect of his right hand Assessment/Plan Impression is that of a C8 radiculopathy. The plan is for a C7-T1 foraminotomies with decompression of the C8 nerve root.  Faythe Ghee, MD 10/07/2013, 7:39 AM

## 2013-10-07 NOTE — Transfer of Care (Signed)
Immediate Anesthesia Transfer of Care Note  Patient: Anthony Hebert  Procedure(s) Performed: Procedure(s): POSTERIOR CERVICAL LAMINECTOMY RIGHT CERVICAL SEVEN -THORACIC ONE FORAMINOTOMY (Right)  Patient Location: PACU  Anesthesia Type:General  Level of Consciousness: awake, alert  and oriented  Airway & Oxygen Therapy: Patient Spontanous Breathing and Patient connected to nasal cannula oxygen  Post-op Assessment: Report given to PACU RN, Post -op Vital signs reviewed and stable and Patient moving all extremities  Post vital signs: Reviewed and stable  Complications: No apparent anesthesia complications

## 2013-10-07 NOTE — Op Note (Signed)
Prep diagnosis: Spondylosis C7-T1 right Postoperative diagnosis: Same Procedure: Right C7-T1 foraminotomy for decompression of C8 nerve root Surgeon: Lillia Mountain Assistant: Ronnald Ramp  In place the prone position the 3-point pin fixation the patient's neck were shaved prepped and draped in the usual sterile fashion. Across her chest port incision over the appropriate level. Midline incision was made over the spinous processes of C7 and T1. Is able to correct the incision was carried of the spinous processes. Subcostal dissection was then carried out on the right side of the spinous processes were self-retaining retractor was placed for exposure. Mardene Celeste reports appropriate level. Lateral laminotomy was performed at C7-T1 facet joint was undermined with the high-speed drill once the cement was not destabilized. We then removed the residual bone and visualized and decompression proceeded amounts foramen. For decompression was carried out until there was no further compression upon the nerve root. Well-visualized decompress only at its foramen at this time. Was carried out and any bleeding controlled coagulation Gelfoam. Was then closed multiple Vicryl the muscle fascia subcutaneous and subcuticular sutures. Trovato Steri-Strips were placed on the skin. Patient was then applied the patient was extubated to recovery in stable condition.

## 2013-10-07 NOTE — Anesthesia Postprocedure Evaluation (Signed)
Anesthesia Post Note  Patient: Anthony Hebert  Procedure(s) Performed: Procedure(s) (LRB): POSTERIOR CERVICAL LAMINECTOMY RIGHT CERVICAL SEVEN -THORACIC ONE FORAMINOTOMY (Right)  Anesthesia type: general  Patient location: PACU  Post pain: Pain level controlled  Post assessment: Patient's Cardiovascular Status Stable  Last Vitals:  Filed Vitals:   10/07/13 1021  BP:   Pulse: 64  Temp: 36.3 C  Resp: 10    Post vital signs: Reviewed and stable  Level of consciousness: sedated  Complications: No apparent anesthesia complications

## 2013-10-08 ENCOUNTER — Encounter (HOSPITAL_COMMUNITY): Payer: Self-pay | Admitting: Neurosurgery

## 2013-10-08 MED ORDER — HYDROCODONE-ACETAMINOPHEN 5-325 MG PO TABS: 1.0000 | ORAL_TABLET | ORAL | Status: DC | PRN

## 2013-10-08 NOTE — Progress Notes (Signed)
Pt given D/C instructions with Rx, verbal understanding was given. Pt D/C'd home via wheelchair @ 1435 per MD order. Pt was stable @ D/C and had no other needs. Holli Humbles, RN

## 2013-10-08 NOTE — Discharge Summary (Signed)
  Physician Discharge Summary  Patient ID: Anthony Hebert MRN: [id number] DOB/AGE: November 29, 1950 63 y.o.  Admit date: 10/07/2013 Discharge date: 10/08/2013  Admission Diagnoses:  Discharge Diagnoses:  Active Problems:   Cervical spondylosis with radiculopathy   Discharged Condition: good  Hospital Course: Surgery oned ay for cervical foraminotomy. Did great with marked improvement in pain. Wound fine. Ambulated well. Home pod 1, specific instructions given.  Consults: None  Significant Diagnostic Studies: none  Treatments: surgery: C7 T1 foraminotomy  Discharge Exam: Blood pressure 103/60, pulse 69, temperature 97.7 F (36.5 C), temperature source Oral, resp. rate 18, SpO2 96.00%. Incision/Wound:clean andd ry; no new neuro issues   Disposition: 01-Home or Self Care     Medication List    ASK your doctor about these medications       HYDROcodone-acetaminophen 5-500 MG per tablet  Commonly known as:  VICODIN  Take 1-2 tablets by mouth every 4 (four) hours as needed. For pain     lisinopril-hydrochlorothiazide 20-12.5 MG per tablet  Commonly known as:  PRINZIDE,ZESTORETIC  Take 1 tablet by mouth daily.     OVER THE COUNTER MEDICATION  Take 1 tablet by mouth daily as needed. For acid reflux. OTC     oxyCODONE-acetaminophen 5-325 MG per tablet  Commonly known as:  PERCOCET/ROXICET  Take 1 tablet by mouth every 6 (six) hours as needed. For pain     simvastatin 40 MG tablet  Commonly known as:  ZOCOR  Take 40 mg by mouth every evening.         At home rest most of the time. Get up 9 or 10 times each day and take a 15 or 20 minute walk. No riding in the car and to your first postoperative appointment. If you have neck surgery you may shower from the chest down starting on the third postoperative day. If you had back surgery he may start showering on the third postoperative day with saran wrap wrapped around your incisional area 3 times. After the shower remove the  saran wrap. Take pain medicine as needed and other medications as instructed. Call my office for an appointment.  SignedFaythe Ghee, MD 10/08/2013, 1:03 PM

## 2013-10-08 NOTE — Discharge Instructions (Signed)
Wound Care °Keep incision covered and dry for one week. °You may shower from the neck down. °You may remove outer bandage after one week.  °Do not put any creams, lotions, or ointments on incision. °Leave steri-strips on neck.  They will fall off by themselves. °Activity °Walk each and every day, increasing distance each day. °No lifting greater than 5 lbs.  Avoid excessive neck motions.. °No driving or riding in car until further notice at follow up appointment. °If provided with neck brace, wear at all times unless instructed otherwise. °Diet °Resume your normal diet.  °Return to Work °Will be discussed at you follow up appointment. °Call Your Doctor If Any of These Occur °Redness, drainage, or swelling at the wound.  °Temperature greater than 101 degrees. °Severe pain not relieved by pain medication. °Incision starts to come apart. °Increased difficulty swallowing. °Follow Up Appt °Call today for appointment in 1-2 weeks ([phone]) or for problems.  If you have any hardware placed in your spine, you will need an x-ray before your appointment. °

## 2014-08-20 ENCOUNTER — Encounter: Payer: Self-pay | Admitting: Gastroenterology

## 2014-08-27 ENCOUNTER — Encounter: Payer: Self-pay | Admitting: Gastroenterology

## 2014-09-21 ENCOUNTER — Ambulatory Visit (AMBULATORY_SURGERY_CENTER): Payer: Self-pay | Admitting: *Deleted

## 2014-09-21 VITALS — Ht 66.0 in | Wt 186.0 lb

## 2014-09-21 DIAGNOSIS — Z86010 Personal history of colonic polyps: Secondary | ICD-10-CM

## 2014-09-21 DIAGNOSIS — E785 Hyperlipidemia, unspecified: Secondary | ICD-10-CM | POA: Insufficient documentation

## 2014-09-21 MED ORDER — MOVIPREP 100 G PO SOLR: ORAL | Status: DC

## 2014-09-21 NOTE — Progress Notes (Signed)
Patient denies any allergies to eggs or soy. Patient denies any problems with sedation, pt does state he had trouble in the past with intubation due to "small throat", pt states last neck sx 2015 he had no problems. Patient denies any oxygen use at home and does not take any diet/weight loss medications. EMMI education assisgned to patient on colonoscopy, this was explained and instructions given to patient.

## 2014-09-24 ENCOUNTER — Encounter: Payer: Self-pay | Admitting: Interventional Cardiology

## 2014-09-24 ENCOUNTER — Ambulatory Visit (INDEPENDENT_AMBULATORY_CARE_PROVIDER_SITE_OTHER): Payer: 59 | Admitting: Interventional Cardiology

## 2014-09-24 VITALS — BP 116/70 | HR 73 | Ht 66.0 in | Wt 185.0 lb

## 2014-09-24 DIAGNOSIS — IMO0001 Reserved for inherently not codable concepts without codable children: Secondary | ICD-10-CM

## 2014-09-24 DIAGNOSIS — R011 Cardiac murmur, unspecified: Secondary | ICD-10-CM

## 2014-09-24 DIAGNOSIS — I10 Essential (primary) hypertension: Secondary | ICD-10-CM

## 2014-09-24 DIAGNOSIS — I700 Atherosclerosis of aorta: Secondary | ICD-10-CM

## 2014-09-24 NOTE — Patient Instructions (Signed)
Your physician recommends that you schedule a follow-up appointment as needed with Dr. Varanasi.  

## 2014-09-24 NOTE — Progress Notes (Signed)
Patient ID: Anthony Hebert, male   DOB: 19-Jul-1951, 64 y.o.   MRN: [id number] Patient ID: Anthony Hebert, male   DOB: [birth year], 64 y.o.   MRN: [id number]    [address], [place], [place]  [address] Phone: [phone] Fax:  [phone]  Date:  09/24/2014   ID:  Anthony Hebert, DOB Dec 30, 1950, MRN [id number]  PCP:  Elton Sin, FNP      History of Present Illness: Anthony Hebert is a 64 y.o. male who has a h/o MI about 20 years ago after back surgery. ECG showed the MI but he never had any other heart tests done at that time. He does not recall having chest pain. He has been on meds for HTN and high cholesterol, for at least 10 years.  Walking while carrying things is the most strenuous activity. He has SHOB with this, while carrying a bags up to 40 lbs. He is a taxidermist. No CP or pressure with activity. No smoking.  Had low back stimulator removed in 2/14. No cardiac issues.  Tolerated neck surgery in 2/15 for pinched nerve.   Walking more in the past few months when the weather cooperates.  No CP or SHOB with walking or at any other time. Intentionally lost weight.  This helps his back.   No signficant family h/o CAD .  No bleeding problems.       Wt Readings from Last 3 Encounters:  09/24/14 185 lb (83.915 kg)  09/21/14 186 lb (84.369 kg)  07/28/13 201 lb (91.173 kg)     Past Medical History  Diagnosis Date  . Heart murmur   . Hypertension   . GERD (gastroesophageal reflux disease)     occ  . Cancer     mole melanoma lft eye  . Difficult intubation     had trouble with intubation    shape of throat;last sx no problems per pt  . Foot swelling     in AA 07/2013    Current Outpatient Prescriptions  Medication Sig Dispense Refill  . HYDROcodone-acetaminophen (NORCO/VICODIN) 5-325 MG per tablet Take 1-2 tablets by mouth every 4 (four) hours as needed for moderate pain. 30 tablet 0  . lisinopril-hydrochlorothiazide (PRINZIDE,ZESTORETIC) 20-12.5 MG per  tablet Take 1 tablet by mouth daily.    Marland Kitchen MOVIPREP 100 G SOLR MoviPrep (no substitutions)-TAKE AS DIRECTED. 1 kit 0  . nabumetone (RELAFEN) 500 MG tablet Take 500 mg by mouth as needed.    Marland Kitchen OVER THE COUNTER MEDICATION Take 1 tablet by mouth daily as needed. For acid reflux. OTC    . oxyCODONE-acetaminophen (PERCOCET/ROXICET) 5-325 MG per tablet Take 1 tablet by mouth every 6 (six) hours as needed. For pain    . simvastatin (ZOCOR) 40 MG tablet Take 40 mg by mouth every evening.     No current facility-administered medications for this visit.    Allergies:   No Known Allergies  Social History:  The patient  reports that he has never smoked. He has never used smokeless tobacco. He reports that he drinks about 1.8 oz of alcohol per week. He reports that he does not use illicit drugs.   Family History:  The patient's family history includes Alzheimer's disease in his father; Cancer in his mother. There is no history of Colon cancer.   ROS:  Please see the history of present illness.  No nausea, vomiting.  No fevers, chills.  No focal weakness.  No dysuria.  All other systems reviewed and negative.   PHYSICAL EXAM: VS:  BP 116/70 mmHg  Pulse 73  Ht 5' 6"  (1.676 m)  Wt 185 lb (83.915 kg)  BMI 29.87 kg/m2 Well nourished, well developed, in no acute distress HEENT: normal Neck: no JVD, no carotid bruits Cardiac:  normal S1, S2; RRR;  Lungs:  clear to auscultation bilaterally, no wheezing, rhonchi or rales Abd: soft, nontender, no hepatomegaly Ext: no edema Skin: warm and dry Neuro:   no focal abnormalities noted Psych: normal affect  EKG:  NSR, no ST segment changes     ASSESSMENT AND PLAN:  Heart Murmur   Echo in 12/13 showed aortic sclerosis and normal LV function.   No significant abnormalities.  2. Hypertension, essential  Continue Lisinopril-Hydrochlorothiazide Tablet, 20-12.5 MG, 1 tablet, Orally, Once a day Is well controlled, even at other appts. 3. Observation for  suspected cardiovascular disease  Diagnostic Imaging:EKG Corson,Danielle 07/26/2012 10:00:00 AM > Zev Blue,[name] 07/26/2012 10:34:09 AM > NSR, isolated Q wave in lead 3, no ST segment changes  COntinue risk factor modification.  4. Preop evalutaion: He walks without any cardiac sx.  He goes up a flight of stairs several times a day without problems.  Tolerated neck surery well from a cardiac standpoint.  Echo in 2013 showed normal LV function. No regional wall motion abnormalities despite his history of MI about 20 years ago.  5. Hyperlipidemia: Simvastatin to keep LDL below 100. Last LDL 105 in 6/14.  Instructed to Decrease fried food intake.  Try to walk at least 30 minutes a day 5 days a week.  Increasing walking after the neck surgery.    Recommended him to start aspirin 81 mg daily, he will likely take it at least several times a week.  No prior bleeding issues.    Signed, Mina Marble, MD, [facility] 2 09/24/2014 12:07 PM

## 2014-10-06 ENCOUNTER — Encounter: Payer: Self-pay | Admitting: Gastroenterology

## 2014-10-06 ENCOUNTER — Ambulatory Visit: Payer: 59 | Admitting: Gastroenterology

## 2014-10-06 VITALS — BP 118/76 | HR 67 | Temp 97.7°F | Resp 19 | Ht 66.0 in | Wt 186.0 lb

## 2014-10-06 DIAGNOSIS — D128 Benign neoplasm of rectum: Secondary | ICD-10-CM

## 2014-10-06 DIAGNOSIS — K621 Rectal polyp: Secondary | ICD-10-CM

## 2014-10-06 DIAGNOSIS — D127 Benign neoplasm of rectosigmoid junction: Secondary | ICD-10-CM

## 2014-10-06 DIAGNOSIS — D125 Benign neoplasm of sigmoid colon: Secondary | ICD-10-CM

## 2014-10-06 DIAGNOSIS — Z86010 Personal history of colonic polyps: Secondary | ICD-10-CM

## 2014-10-06 DIAGNOSIS — D124 Benign neoplasm of descending colon: Secondary | ICD-10-CM

## 2014-10-06 DIAGNOSIS — D129 Benign neoplasm of anus and anal canal: Secondary | ICD-10-CM

## 2014-10-06 MED ORDER — SODIUM CHLORIDE 0.9 % IV SOLN: 500.0000 mL | INTRAVENOUS | Status: DC

## 2014-10-06 NOTE — Op Note (Signed)
[facility]  [address] [place], [address]   COLONOSCOPY PROCEDURE REPORT  PATIENT: Anthony Hebert, Anthony Hebert  MR#: [id number] BIRTHDATE: 03/29/1951 , 63  yrs. old GENDER: male ENDOSCOPIST: Milus Banister, MD PROCEDURE DATE:  10/06/2014 PROCEDURE:   Colonoscopy with snare polypectomy First Screening Colonoscopy - Avg.  risk and is 50 yrs.  old or older - No.  Prior Negative Screening - Now for repeat screening. N/A  History of Adenoma - Now for follow-up colonoscopy & has been > or = to 3 yrs.  Yes hx of adenoma.  Has been 3 or more years since last colonoscopy.  Polyps Removed Today? Yes. ASA CLASS:   Class II INDICATIONS:Three adenomatous polyps removed 2007, none noted 2010.  MEDICATIONS: Monitored anesthesia care and Propofol 200 mg IV  DESCRIPTION OF PROCEDURE:   After the risks benefits and alternatives of the procedure were thoroughly explained, informed consent was obtained.  The digital rectal exam revealed no abnormalities of the rectum.   The [id number]  endoscope was introduced through the anus and advanced to the cecum, which was identified by both the appendix and ileocecal valve. No adverse events experienced.   The quality of the prep was excellent.  The instrument was then slowly withdrawn as the colon was fully examined.  COLON FINDINGS: Three polyps were found, removed and sent to pathology.  These were all sessile, 3-100mm across, located in descending/sigmoid/rectal segments, all were removed with cold snare.  There were several diverticulum in the left colon.  The examination was otherwise normal.  Retroflexed views revealed no abnormalities. The time to cecum=2 minutes 31 seconds.  Withdrawal time=9 minutes 19 seconds.  The scope was withdrawn and the procedure completed. COMPLICATIONS: There were no immediate complications.  ENDOSCOPIC IMPRESSION: Three polyps were found, removed and sent to pathology. There were several  diverticulum in the left colon. The examination was otherwise normal  RECOMMENDATIONS: If the polyp(s) removed today are proven to be adenomatous (pre-cancerous) polyps, you will need a colonoscopy in 3-5 years. You will receive a letter within 1-2 weeks with the results of your biopsy as well as final recommendations.  Please call my office if you have not received a letter after 3 weeks.  eSigned:  Milus Banister, MD 10/06/2014 2:45 PM   cc: Crist Infante, MD

## 2014-10-06 NOTE — Patient Instructions (Signed)
YOU HAD AN ENDOSCOPIC PROCEDURE TODAY AT THE [facility]: Refer to the procedure report that was given to you for any specific questions about what was found during the examination.  If the procedure report does not answer your questions, please call your gastroenterologist to clarify.  If you requested that your care partner not be given the details of your procedure findings, then the procedure report has been included in a sealed envelope for you to review at your convenience later.  YOU SHOULD EXPECT: Some feelings of bloating in the abdomen. Passage of more gas than usual.  Walking can help get rid of the air that was put into your GI tract during the procedure and reduce the bloating. If you had a lower endoscopy (such as a colonoscopy or flexible sigmoidoscopy) you may notice spotting of blood in your stool or on the toilet paper. If you underwent a bowel prep for your procedure, then you may not have a normal bowel movement for a few days.  DIET: Your first meal following the procedure should be a light meal and then it is ok to progress to your normal diet.  A half-sandwich or bowl of soup is an example of a good first meal.  Heavy or fried foods are harder to digest and may make you feel nauseous or bloated.  Likewise meals heavy in dairy and vegetables can cause extra gas to form and this can also increase the bloating.  Drink plenty of fluids but you should avoid alcoholic beverages for 24 hours.  ACTIVITY: Your care partner should take you home directly after the procedure.  You should plan to take it easy, moving slowly for the rest of the day.  You can resume normal activity the day after the procedure however you should NOT DRIVE or use heavy machinery for 24 hours (because of the sedation medicines used during the test).    SYMPTOMS TO REPORT IMMEDIATELY: A gastroenterologist can be reached at any hour.  During normal business hours, 8:30 AM to 5:00 PM Monday through Friday,  call [phone].  After hours and on weekends, please call the GI answering service at [phone] who will take a message and have the physician on call contact you.   Following lower endoscopy (colonoscopy or flexible sigmoidoscopy):  Excessive amounts of blood in the stool  Significant tenderness or worsening of abdominal pains  Swelling of the abdomen that is new, acute  Fever of 100F or higher   FOLLOW UP: If any biopsies were taken you will be contacted by phone or by letter within the next 1-3 weeks.  Call your gastroenterologist if you have not heard about the biopsies in 3 weeks.  Our staff will call the home number listed on your records the next business day following your procedure to check on you and address any questions or concerns that you may have at that time regarding the information given to you following your procedure. This is a courtesy call and so if there is no answer at the home number and we have not heard from you through the emergency physician on call, we will assume that you have returned to your regular daily activities without incident.  SIGNATURES/CONFIDENTIALITY: You and/or your care partner have signed paperwork which will be entered into your electronic medical record.  These signatures attest to the fact that that the information above on your After Visit Summary has been reviewed and is understood.  Full responsibility of the confidentiality of   this discharge information lies with you and/or your care-partner.   Resume medications. Information given on polyps,diverticulosis and high fiber diet with discharge instructions. 

## 2014-10-06 NOTE — Progress Notes (Signed)
Called to room to assist during endoscopic procedure.  Patient ID and intended procedure confirmed with present staff. Received instructions for my participation in the procedure from the performing physician.  

## 2014-10-06 NOTE — Progress Notes (Signed)
Report to PACU, RN, vss, BBS= Clear.  

## 2014-10-07 ENCOUNTER — Telehealth: Payer: Self-pay | Admitting: *Deleted

## 2014-10-07 NOTE — Telephone Encounter (Signed)
Message left

## 2014-10-13 ENCOUNTER — Encounter: Payer: Self-pay | Admitting: Gastroenterology

## 2015-03-31 ENCOUNTER — Encounter: Payer: Self-pay | Admitting: Gastroenterology

## 2015-04-27 ENCOUNTER — Other Ambulatory Visit: Payer: Self-pay | Admitting: Orthopedic Surgery

## 2015-07-06 ENCOUNTER — Encounter (HOSPITAL_COMMUNITY): Payer: Self-pay

## 2015-07-06 ENCOUNTER — Encounter (HOSPITAL_COMMUNITY)
Admission: RE | Admit: 2015-07-06 | Discharge: 2015-07-06 | Disposition: A | Discharge status: Home / Self Care | Payer: 59 | Source: Ambulatory Visit | Attending: Orthopedic Surgery | Admitting: Orthopedic Surgery

## 2015-07-06 DIAGNOSIS — G5601 Carpal tunnel syndrome, right upper limb: Secondary | ICD-10-CM | POA: Diagnosis not present

## 2015-07-06 DIAGNOSIS — Z01812 Encounter for preprocedural laboratory examination: Secondary | ICD-10-CM | POA: Insufficient documentation

## 2015-07-06 DIAGNOSIS — Z01818 Encounter for other preprocedural examination: Secondary | ICD-10-CM | POA: Insufficient documentation

## 2015-07-06 DIAGNOSIS — I10 Essential (primary) hypertension: Secondary | ICD-10-CM | POA: Insufficient documentation

## 2015-07-06 DIAGNOSIS — K219 Gastro-esophageal reflux disease without esophagitis: Secondary | ICD-10-CM | POA: Insufficient documentation

## 2015-07-06 DIAGNOSIS — Z981 Arthrodesis status: Secondary | ICD-10-CM | POA: Diagnosis not present

## 2015-07-06 HISTORY — DX: Other complications of anesthesia, initial encounter: T88.59XA

## 2015-07-06 HISTORY — DX: Unspecified osteoarthritis, unspecified site: M19.90

## 2015-07-06 HISTORY — DX: Adverse effect of unspecified anesthetic, initial encounter: T41.45XA

## 2015-07-06 LAB — BASIC METABOLIC PANEL
Anion gap: 6 (ref 5–15)
BUN: 11 mg/dL (ref 6–20)
CHLORIDE: 106 mmol/L (ref 101–111)
CO2: 30 mmol/L (ref 22–32)
CREATININE: 1.05 mg/dL (ref 0.61–1.24)
Calcium: 9.2 mg/dL (ref 8.9–10.3)
Glucose, Bld: 75 mg/dL (ref 65–99)
POTASSIUM: 4.2 mmol/L (ref 3.5–5.1)
SODIUM: 142 mmol/L (ref 135–145)

## 2015-07-06 LAB — CBC
HEMATOCRIT: 41.1 % (ref 39.0–52.0)
Hemoglobin: 14.3 g/dL (ref 13.0–17.0)
MCH: 31.8 pg (ref 26.0–34.0)
MCHC: 34.8 g/dL (ref 30.0–36.0)
MCV: 91.3 fL (ref 78.0–100.0)
PLATELETS: 148 10*3/uL — AB (ref 150–400)
RBC: 4.5 MIL/uL (ref 4.22–5.81)
RDW: 12.5 % (ref 11.5–15.5)
WBC: 5.9 10*3/uL (ref 4.0–10.5)

## 2015-07-06 NOTE — Progress Notes (Signed)
   07/06/15 0915  OBSTRUCTIVE SLEEP APNEA  Have you ever been diagnosed with sleep apnea through a sleep study? No  Do you snore loudly (loud enough to be heard through closed doors)?  1  Do you often feel tired, fatigued, or sleepy during the daytime (such as falling asleep during driving or talking to someone)? 0  Has anyone observed you stop breathing during your sleep? 0  Do you have, or are you being treated for high blood pressure? 1  BMI more than 35 kg/m2? 0  Age > 50 (1-yes) 1  Neck circumference greater than:Male 16 inches or larger, Male 17inches or larger? 1  Male Gender (Yes=1) 1  Obstructive Sleep Apnea Score 5

## 2015-07-06 NOTE — Pre-Procedure Instructions (Signed)
    TRINTON MACFADYEN  07/06/2015      [facility] [place], [place] - [place] [address] Phone: [phone] Fax: [phone]    Your procedure is scheduled on Thursday, December 1st, 2016.  Report to [facility] Admitting at 11:00 A.M.  Call this number if you have problems the morning of surgery:  [phone]   Remember:  Do not eat food or drink liquids after midnight.   Take these medicines the morning of surgery with A SIP OF WATER: Allopurinol (Zyloprim) if needed.  7 days prior to surgery, stop taking: Aspirin, NSAIDS, Aleve, Naproxen, Ibuprofen, Advil, Motrin, BC's, Goody's, Fish oil, all herbal medications, and all vitamins.    Do not wear jewelry.  Do not wear lotions, powders, or colognes.  You may wear deodorant.  Men may shave face and neck.  Do not bring valuables to the hospital.  [facility] is not responsible for any belongings or valuables.  Contacts, dentures or bridgework may not be worn into surgery.  Leave your suitcase in the car.  After surgery it may be brought to your room.  For patients admitted to the hospital, discharge time will be determined by your treatment team.  Patients discharged the day of surgery will not be allowed to drive home.   Special instructions:  See attached.   Please read over the following fact sheets that you were given. Pain Booklet, Coughing and Deep Breathing and Surgical Site Infection Prevention

## 2015-07-06 NOTE — Progress Notes (Signed)
PCP - Dr. Crist Infante Cardiologist - Dr. Irish Lack (was released)  EKG - 09/24/14 - Epic CXR - denies  Echo- 2013 Stress test and Cardiac Cath - denies  Patient denies chest pain and shortness of breath at PAT appointment.  Ebony Hail aware of difficult intubation but stated that she did not need to see patient at PAT appointment.  Chart forwarded to [facility] for review.

## 2015-07-07 NOTE — Progress Notes (Signed)
Anesthesia Chart Review: Patient is a 64 year old male scheduled for right FA and wrist CTR, guyons canal release ulnar nerve, ulnar nerve decompression, anterior transposition with supercharged anterior interosseous nerve to deep ulnar nerve, possible chamitz tendon transfer on 07/15/15 by Dr. Amedeo Plenty.  History includes DIFFICULT AIRWAY, non-smoker, HTN, GERD, melanoma (left eye), heart murmur with aortic valve sclerosis and trivial TR by 07/2012 echo, removal of spinal cord stimulator '14, posterior C7-T1 foraminotomy 10/07/13. I saw him back on 09/17/12 for a pre-operative evaluation due to difficult airway history. He reported possible CVA after a surgery > 10 years ago, but denied known CAD. However, per cardiology notes by Dr. Irish Lack, patient has a history of MI ~ 20 years ago after a back surgery. Question if this was based on EKG alone (inferior Q wave in III) as patient never had any other heart tests done until an echo in 2013 that showed no regional wall motion abnormalities. Dr. Irish Lack last saw on 09/24/14 with patient instructions indicating "follow-up appointment as needed with Dr. Irish Lack." Dr. Irish Lack cleared patient for surgery at that time, although the type of surgery was not specified. PCP is Waunita Schooner, FNP. OSA screening score was 5.  Per my 09/17/12 note and subsequent anesthesia records: He was seen by Anesthesiologist Dr. Linna Caprice prior to his surgery in August 2012. At that time patient reported a previous awake nasal intubation, but later had a successful intubation with glidescope. On 03/21/11, he underwent right L1-2 fusion. He had post-operative weakness and was taken back to the OR that same day for wound exploration. He was intubated for both of these procedures using a glidescope and rigid stylet. His airway was noted to be very anterior with small opening. Following his second surgery, he remained intubated due to low respiratory rate and periods of apnea, negative cuff  leak. He was seen by Critical Care and ultimately extubated on POD #1. According to [facility] notes, there was question if undiagnosed OSA contributed to his post-operative respiratory failure as he had no known history of pulmonary disease. He had not had a sleep study.  On 09/19/12 MAC 4 with Bougie stylet used to place 32mm ETT. On 10/07/13, IV induction for GETA using rigid stylet with video laryngoscopy was used. Recommend- induction with short-acting agent, and alternative techniques readily available.  09/24/14 EKG: NSR, inferior infarct (age undetermined). Isolated Q wave in III. EKG appears stable since at least 09/29/13.   08/02/12 Echo: LVEF 60-65%, aortic valve sclerosis but opens well. Trivial TR.  Preoperative labs noted.  He saw cardiology within the year with no new testing ordered. Apparently cleared for some sort of surgery at that time. He does have a history of difficult intubation but successful intubation with glidescope in the recent past. Multiple anesthesia records available for review in Epic. If no acute changes then I anticipate that he can proceed as planned. Definitive anesthesia plan following evaluation on the day of surgery.  Anthony Hebert [facility] Short Stay Center/Anesthesiology Phone [phone] 07/07/2015 6:25 PM

## 2015-07-14 MED ORDER — CEFAZOLIN SODIUM-DEXTROSE 2-3 GM-% IV SOLR
2.0000 g | INTRAVENOUS | Status: AC
  Administered 2015-07-15: 2 g via INTRAVENOUS
  Filled 2015-07-14: qty 50

## 2015-07-14 MED ORDER — CHLORHEXIDINE GLUCONATE 4 % EX LIQD: 60.0000 mL | Freq: Once | CUTANEOUS | Status: DC

## 2015-07-14 MED ORDER — SODIUM CHLORIDE 0.45 % IV SOLN: INTRAVENOUS | Status: DC

## 2015-07-15 ENCOUNTER — Ambulatory Visit (HOSPITAL_COMMUNITY): Payer: 59 | Admitting: Vascular Surgery

## 2015-07-15 ENCOUNTER — Ambulatory Visit (HOSPITAL_COMMUNITY): Payer: 59 | Admitting: Anesthesiology

## 2015-07-15 ENCOUNTER — Encounter (HOSPITAL_COMMUNITY)
Admission: RE | Disposition: A | Discharge status: Home / Self Care | Payer: Self-pay | Source: Ambulatory Visit | Attending: Orthopedic Surgery

## 2015-07-15 ENCOUNTER — Encounter (HOSPITAL_COMMUNITY): Payer: Self-pay | Admitting: General Practice

## 2015-07-15 ENCOUNTER — Observation Stay (HOSPITAL_COMMUNITY)
Admission: RE | Admit: 2015-07-15 | Discharge: 2015-07-17 | Disposition: A | Discharge status: Home / Self Care | Payer: 59 | Source: Ambulatory Visit | Attending: Orthopedic Surgery | Admitting: Orthopedic Surgery

## 2015-07-15 DIAGNOSIS — M6788 Other specified disorders of synovium and tendon, other site: Secondary | ICD-10-CM | POA: Diagnosis not present

## 2015-07-15 DIAGNOSIS — G5621 Lesion of ulnar nerve, right upper limb: Principal | ICD-10-CM | POA: Diagnosis present

## 2015-07-15 DIAGNOSIS — G5601 Carpal tunnel syndrome, right upper limb: Secondary | ICD-10-CM | POA: Insufficient documentation

## 2015-07-15 DIAGNOSIS — K219 Gastro-esophageal reflux disease without esophagitis: Secondary | ICD-10-CM | POA: Diagnosis not present

## 2015-07-15 DIAGNOSIS — Z79899 Other long term (current) drug therapy: Secondary | ICD-10-CM | POA: Diagnosis not present

## 2015-07-15 DIAGNOSIS — I10 Essential (primary) hypertension: Secondary | ICD-10-CM | POA: Insufficient documentation

## 2015-07-15 DIAGNOSIS — Z85820 Personal history of malignant melanoma of skin: Secondary | ICD-10-CM | POA: Diagnosis not present

## 2015-07-15 DIAGNOSIS — M1990 Unspecified osteoarthritis, unspecified site: Secondary | ICD-10-CM | POA: Diagnosis not present

## 2015-07-15 HISTORY — DX: Cerebral infarction, unspecified: I63.9

## 2015-07-15 HISTORY — PX: CARPAL TUNNEL RELEASE: SHX101

## 2015-07-15 HISTORY — DX: Other chronic pain: G89.29

## 2015-07-15 HISTORY — DX: Hyperlipidemia, unspecified: E78.5

## 2015-07-15 HISTORY — DX: Gout, unspecified: M10.9

## 2015-07-15 HISTORY — DX: Malignant melanoma of skin, unspecified: C43.9

## 2015-07-15 HISTORY — DX: Dorsalgia, unspecified: M54.9

## 2015-07-15 HISTORY — PX: ULNAR NERVE TRANSPOSITION: SHX2595

## 2015-07-15 SURGERY — CARPAL TUNNEL RELEASE
Anesthesia: Regional | Laterality: Right

## 2015-07-15 MED ORDER — ONDANSETRON HCL 4 MG PO TABS
4.0000 mg | ORAL_TABLET | Freq: Four times a day (QID) | ORAL | Status: DC | PRN
  Administered 2015-07-16: 4 mg via ORAL
  Filled 2015-07-15: qty 1

## 2015-07-15 MED ORDER — LISINOPRIL 20 MG PO TABS
20.0000 mg | ORAL_TABLET | Freq: Every day | ORAL | Status: DC
  Administered 2015-07-15 – 2015-07-17 (×3): 20 mg via ORAL
  Filled 2015-07-15 (×3): qty 1

## 2015-07-15 MED ORDER — MIDAZOLAM HCL 2 MG/2ML IJ SOLN
INTRAMUSCULAR | Status: AC
  Filled 2015-07-15: qty 2

## 2015-07-15 MED ORDER — LACTATED RINGERS IV SOLN
INTRAVENOUS | Status: DC
  Administered 2015-07-15 (×2): via INTRAVENOUS

## 2015-07-15 MED ORDER — PROPOFOL 10 MG/ML IV BOLUS
INTRAVENOUS | Status: AC
  Filled 2015-07-15: qty 20

## 2015-07-15 MED ORDER — METHOCARBAMOL 1000 MG/10ML IJ SOLN: 500.0000 mg | Freq: Four times a day (QID) | INTRAVENOUS | Status: DC | PRN

## 2015-07-15 MED ORDER — GLYCOPYRROLATE 0.2 MG/ML IJ SOLN
INTRAMUSCULAR | Status: AC
  Filled 2015-07-15: qty 4

## 2015-07-15 MED ORDER — ONDANSETRON HCL 4 MG/2ML IJ SOLN
4.0000 mg | Freq: Four times a day (QID) | INTRAMUSCULAR | Status: DC | PRN
  Filled 2015-07-15: qty 2

## 2015-07-15 MED ORDER — ADULT MULTIVITAMIN W/MINERALS CH
1.0000 | ORAL_TABLET | Freq: Every day | ORAL | Status: DC
  Administered 2015-07-15 – 2015-07-17 (×3): 1 via ORAL
  Filled 2015-07-15 (×3): qty 1

## 2015-07-15 MED ORDER — ALLOPURINOL 300 MG PO TABS: 300.0000 mg | ORAL_TABLET | Freq: Every day | ORAL | Status: DC | PRN

## 2015-07-15 MED ORDER — MIDAZOLAM HCL 2 MG/2ML IJ SOLN
2.0000 mg | Freq: Once | INTRAMUSCULAR | Status: AC
  Administered 2015-07-15: 2 mg via INTRAVENOUS

## 2015-07-15 MED ORDER — SENNA 8.6 MG PO TABS
1.0000 | ORAL_TABLET | Freq: Two times a day (BID) | ORAL | Status: DC
  Administered 2015-07-15 – 2015-07-17 (×4): 8.6 mg via ORAL
  Filled 2015-07-15 (×4): qty 1

## 2015-07-15 MED ORDER — MIDAZOLAM HCL 5 MG/5ML IJ SOLN
INTRAMUSCULAR | Status: DC | PRN
  Administered 2015-07-15: 2 mg via INTRAVENOUS

## 2015-07-15 MED ORDER — NEOSTIGMINE METHYLSULFATE 10 MG/10ML IV SOLN
INTRAVENOUS | Status: AC
  Filled 2015-07-15: qty 1

## 2015-07-15 MED ORDER — FENTANYL CITRATE (PF) 100 MCG/2ML IJ SOLN: 25.0000 ug | INTRAMUSCULAR | Status: DC | PRN

## 2015-07-15 MED ORDER — VITAMIN C 500 MG PO TABS
1000.0000 mg | ORAL_TABLET | Freq: Every day | ORAL | Status: DC
Start: 2015-07-15 — End: 2015-07-17
  Administered 2015-07-15 – 2015-07-17 (×3): 1000 mg via ORAL
  Filled 2015-07-15 (×3): qty 2

## 2015-07-15 MED ORDER — OXYCODONE HCL 5 MG PO TABS: 5.0000 mg | ORAL_TABLET | Freq: Once | ORAL | Status: DC | PRN

## 2015-07-15 MED ORDER — FENTANYL CITRATE (PF) 100 MCG/2ML IJ SOLN
INTRAMUSCULAR | Status: AC
Start: 2015-07-15 — End: 2015-07-16
  Filled 2015-07-15: qty 2

## 2015-07-15 MED ORDER — DEXAMETHASONE SODIUM PHOSPHATE 10 MG/ML IJ SOLN
INTRAMUSCULAR | Status: AC
  Filled 2015-07-15: qty 1

## 2015-07-15 MED ORDER — ROCURONIUM BROMIDE 50 MG/5ML IV SOLN
INTRAVENOUS | Status: AC
  Filled 2015-07-15: qty 1

## 2015-07-15 MED ORDER — SODIUM CHLORIDE 0.45 % IV SOLN
INTRAVENOUS | Status: DC
  Administered 2015-07-16: 04:00:00 via INTRAVENOUS

## 2015-07-15 MED ORDER — CEFAZOLIN SODIUM-DEXTROSE 2-3 GM-% IV SOLR
2.0000 g | Freq: Three times a day (TID) | INTRAVENOUS | Status: DC
  Administered 2015-07-16 – 2015-07-17 (×5): 2 g via INTRAVENOUS
  Filled 2015-07-15 (×8): qty 50

## 2015-07-15 MED ORDER — HEMOSTATIC AGENTS (NO CHARGE) OPTIME
TOPICAL | Status: DC | PRN
  Administered 2015-07-15: 1 via TOPICAL

## 2015-07-15 MED ORDER — PHENYLEPHRINE 40 MCG/ML (10ML) SYRINGE FOR IV PUSH (FOR BLOOD PRESSURE SUPPORT)
PREFILLED_SYRINGE | INTRAVENOUS | Status: AC
  Filled 2015-07-15: qty 10

## 2015-07-15 MED ORDER — BUPIVACAINE-EPINEPHRINE (PF) 0.5% -1:200000 IJ SOLN
INTRAMUSCULAR | Status: DC | PRN
  Administered 2015-07-15: 25 mL via PERINEURAL

## 2015-07-15 MED ORDER — ONDANSETRON HCL 4 MG/2ML IJ SOLN: 4.0000 mg | Freq: Once | INTRAMUSCULAR | Status: DC | PRN

## 2015-07-15 MED ORDER — ONDANSETRON HCL 4 MG/2ML IJ SOLN
INTRAMUSCULAR | Status: AC
  Filled 2015-07-15: qty 2

## 2015-07-15 MED ORDER — LIDOCAINE HCL (CARDIAC) 20 MG/ML IV SOLN
INTRAVENOUS | Status: DC | PRN
  Administered 2015-07-15: 80 mg via INTRAVENOUS

## 2015-07-15 MED ORDER — OXYCODONE HCL 5 MG PO TABS
5.0000 mg | ORAL_TABLET | ORAL | Status: DC | PRN
  Administered 2015-07-15 – 2015-07-16 (×5): 10 mg via ORAL
  Administered 2015-07-16 (×2): 5 mg via ORAL
  Filled 2015-07-15 (×2): qty 2
  Filled 2015-07-15: qty 1
  Filled 2015-07-15 (×4): qty 2

## 2015-07-15 MED ORDER — CEFAZOLIN SODIUM 1-5 GM-% IV SOLN: 1.0000 g | INTRAVENOUS | Status: AC

## 2015-07-15 MED ORDER — 0.9 % SODIUM CHLORIDE (POUR BTL) OPTIME
TOPICAL | Status: DC | PRN
  Administered 2015-07-15 (×2): 1000 mL

## 2015-07-15 MED ORDER — METHOCARBAMOL 500 MG PO TABS
500.0000 mg | ORAL_TABLET | Freq: Four times a day (QID) | ORAL | Status: DC | PRN
  Administered 2015-07-15 – 2015-07-17 (×5): 500 mg via ORAL
  Filled 2015-07-15 (×5): qty 1

## 2015-07-15 MED ORDER — PROPOFOL 10 MG/ML IV BOLUS
INTRAVENOUS | Status: DC | PRN
  Administered 2015-07-15: 200 mg via INTRAVENOUS

## 2015-07-15 MED ORDER — ALPRAZOLAM 0.5 MG PO TABS: 0.5000 mg | ORAL_TABLET | Freq: Four times a day (QID) | ORAL | Status: DC | PRN

## 2015-07-15 MED ORDER — OXYCODONE HCL 5 MG/5ML PO SOLN: 5.0000 mg | Freq: Once | ORAL | Status: DC | PRN

## 2015-07-15 MED ORDER — EVICEL 2 ML EX KIT
PACK | CUTANEOUS | Status: AC
  Filled 2015-07-15: qty 1

## 2015-07-15 MED ORDER — DIPHENHYDRAMINE HCL 25 MG PO CAPS
25.0000 mg | ORAL_CAPSULE | Freq: Four times a day (QID) | ORAL | Status: DC | PRN
Start: 2015-07-15 — End: 2015-07-17

## 2015-07-15 MED ORDER — FENTANYL CITRATE (PF) 100 MCG/2ML IJ SOLN
50.0000 ug | Freq: Once | INTRAMUSCULAR | Status: AC
  Administered 2015-07-15: 50 ug via INTRAVENOUS

## 2015-07-15 MED ORDER — ONDANSETRON HCL 4 MG/2ML IJ SOLN
INTRAMUSCULAR | Status: DC | PRN
  Administered 2015-07-15: 4 mg via INTRAVENOUS

## 2015-07-15 MED ORDER — PHENYLEPHRINE HCL 10 MG/ML IJ SOLN
INTRAMUSCULAR | Status: DC | PRN
  Administered 2015-07-15 (×4): 40 ug via INTRAVENOUS

## 2015-07-15 MED ORDER — SIMVASTATIN 40 MG PO TABS
40.0000 mg | ORAL_TABLET | Freq: Every evening | ORAL | Status: DC
  Administered 2015-07-15 – 2015-07-16 (×2): 40 mg via ORAL
  Filled 2015-07-15 (×2): qty 1

## 2015-07-15 MED ORDER — MORPHINE SULFATE (PF) 2 MG/ML IV SOLN: 1.0000 mg | INTRAVENOUS | Status: DC | PRN

## 2015-07-15 MED ORDER — EPHEDRINE SULFATE 50 MG/ML IJ SOLN
INTRAMUSCULAR | Status: AC
  Filled 2015-07-15: qty 1

## 2015-07-15 MED ORDER — PANTOPRAZOLE SODIUM 40 MG PO TBEC: 40.0000 mg | DELAYED_RELEASE_TABLET | Freq: Two times a day (BID) | ORAL | Status: DC | PRN

## 2015-07-15 MED ORDER — LIDOCAINE HCL (CARDIAC) 20 MG/ML IV SOLN
INTRAVENOUS | Status: AC
  Filled 2015-07-15: qty 5

## 2015-07-15 MED ORDER — FENTANYL CITRATE (PF) 250 MCG/5ML IJ SOLN
INTRAMUSCULAR | Status: AC
  Filled 2015-07-15: qty 5

## 2015-07-15 SURGICAL SUPPLY — 60 items
BANDAGE ELASTIC 3 VELCRO ST LF (GAUZE/BANDAGES/DRESSINGS) ×6 IMPLANT
BANDAGE ELASTIC 4 VELCRO ST LF (GAUZE/BANDAGES/DRESSINGS) ×6 IMPLANT
BNDG ESMARK 4X9 LF (GAUZE/BANDAGES/DRESSINGS) ×3 IMPLANT
BNDG GAUZE ELAST 4 BULKY (GAUZE/BANDAGES/DRESSINGS) ×3 IMPLANT
CORDS BIPOLAR (ELECTRODE) ×3 IMPLANT
COVER SURGICAL LIGHT HANDLE (MISCELLANEOUS) ×3 IMPLANT
CUFF TOURNIQUET SINGLE 18IN (TOURNIQUET CUFF) ×3 IMPLANT
CUFF TOURNIQUET SINGLE 24IN (TOURNIQUET CUFF) IMPLANT
DRAPE SURG 17X23 STRL (DRAPES) ×3 IMPLANT
DRSG ADAPTIC 3X8 NADH LF (GAUZE/BANDAGES/DRESSINGS) ×6 IMPLANT
EVACUATOR 1/8 PVC DRAIN (DRAIN) IMPLANT
EVICEL 2ML SEALANT HUMAN (Miscellaneous) ×6 IMPLANT
FIBRIN SEALANT ×3 IMPLANT
FIBRIN SEALANT EVICEL ×2 IMPLANT
GAUZE SPONGE 4X4 12PLY STRL (GAUZE/BANDAGES/DRESSINGS) ×3 IMPLANT
GAUZE XEROFORM 1X8 LF (GAUZE/BANDAGES/DRESSINGS) ×3 IMPLANT
GAUZE XEROFORM 5X9 LF (GAUZE/BANDAGES/DRESSINGS) ×6 IMPLANT
GLOVE BIO SURGEON STRL SZ 6.5 (GLOVE) ×2 IMPLANT
GLOVE BIO SURGEONS STRL SZ 6.5 (GLOVE) ×1
GLOVE BIOGEL M STRL SZ7.5 (GLOVE) ×3 IMPLANT
GLOVE BIOGEL PI IND STRL 6.5 (GLOVE) ×1 IMPLANT
GLOVE BIOGEL PI INDICATOR 6.5 (GLOVE) ×2
GLOVE SS BIOGEL STRL SZ 8 (GLOVE) ×1 IMPLANT
GLOVE SUPERSENSE BIOGEL SZ 8 (GLOVE) ×2
GOWN STRL REUS W/ TWL LRG LVL3 (GOWN DISPOSABLE) ×2 IMPLANT
GOWN STRL REUS W/ TWL XL LVL3 (GOWN DISPOSABLE) ×3 IMPLANT
GOWN STRL REUS W/TWL LRG LVL3 (GOWN DISPOSABLE) ×4
GOWN STRL REUS W/TWL XL LVL3 (GOWN DISPOSABLE) ×6
KIT BASIN OR (CUSTOM PROCEDURE TRAY) ×3 IMPLANT
KIT ROOM TURNOVER OR (KITS) ×3 IMPLANT
LOOP VESSEL MAXI BLUE (MISCELLANEOUS) IMPLANT
NEEDLE HYPO 25GX1X1/2 BEV (NEEDLE) IMPLANT
NS IRRIG 1000ML POUR BTL (IV SOLUTION) ×6 IMPLANT
PACK ORTHO EXTREMITY (CUSTOM PROCEDURE TRAY) ×3 IMPLANT
PAD ARMBOARD 7.5X6 YLW CONV (MISCELLANEOUS) ×6 IMPLANT
PAD CAST 4YDX4 CTTN HI CHSV (CAST SUPPLIES) ×2 IMPLANT
PADDING CAST COTTON 4X4 STRL (CAST SUPPLIES) ×4
SLING ARM FOAM STRAP LRG (SOFTGOODS) ×3 IMPLANT
SOLUTION BETADINE 4OZ (MISCELLANEOUS) ×3 IMPLANT
SPLINT FIBERGLASS 4X30 (CAST SUPPLIES) ×3 IMPLANT
SPONGE GAUZE 4X4 12PLY STER LF (GAUZE/BANDAGES/DRESSINGS) ×3 IMPLANT
SPONGE SCRUB IODOPHOR (GAUZE/BANDAGES/DRESSINGS) ×3 IMPLANT
SUCTION FRAZIER TIP 10 FR DISP (SUCTIONS) IMPLANT
SUT 2 FIBERLOOP 20 STRT BLUE (SUTURE) ×3
SUT ETHILON 8 0 TG100 8 (SUTURE) ×3 IMPLANT
SUT PROLENE 3 0 PS 2 (SUTURE) ×9 IMPLANT
SUT PROLENE 4 0 PS 2 18 (SUTURE) ×9 IMPLANT
SUT VIC AB 2-0 CT1 27 (SUTURE)
SUT VIC AB 2-0 CT1 TAPERPNT 27 (SUTURE) IMPLANT
SUT VIC AB 3-0 FS2 27 (SUTURE) ×3 IMPLANT
SUTURE 2 FIBERLOOP 20 STRT BLU (SUTURE) ×1 IMPLANT
SYR CONTROL 10ML LL (SYRINGE) IMPLANT
SYSTEM CHEST DRAIN TLS 7FR (DRAIN) ×3 IMPLANT
TOWEL OR 17X24 6PK STRL BLUE (TOWEL DISPOSABLE) ×6 IMPLANT
TOWEL OR 17X26 10 PK STRL BLUE (TOWEL DISPOSABLE) ×3 IMPLANT
TUBE CONNECTING 12'X1/4 (SUCTIONS)
TUBE CONNECTING 12X1/4 (SUCTIONS) IMPLANT
TUBE EVACUATION TLS (MISCELLANEOUS) ×3 IMPLANT
UNDERPAD 30X30 INCONTINENT (UNDERPADS AND DIAPERS) ×3 IMPLANT
WATER STERILE IRR 1000ML POUR (IV SOLUTION) ×3 IMPLANT

## 2015-07-15 NOTE — Transfer of Care (Signed)
Immediate Anesthesia Transfer of Care Note  Patient: Anthony Hebert  Procedure(s) Performed: Procedure(s): RIGHT FOREARM AND WRIST CARPAL TUNNEL RELEASE (Right) RIGHT GUYON'S CANAL RELEASE (Right) RIGHT SUPER  CHARGED AIN TO DEEP  ULNAR NERVE ELBOW AND NERVE TRANSFER, POSSIBLE CHAMITZ TENDON TRANSFER  (Right)  Patient Location: PACU  Anesthesia Type:GA combined with regional for post-op pain  Level of Consciousness: awake  Airway & Oxygen Therapy: Patient Spontanous Breathing and Patient connected to nasal cannula oxygen  Post-op Assessment: Report given to RN and Post -op Vital signs reviewed and stable  Post vital signs: stable  Last Vitals:  Filed Vitals:   07/15/15 1250 07/15/15 1255  BP: 123/68 134/71  Pulse: 79 76  Temp:    Resp: 15 17    Complications: No apparent anesthesia complications

## 2015-07-15 NOTE — Anesthesia Procedure Notes (Addendum)
Anesthesia Regional Block:  Supraclavicular block  Pre-Anesthetic Checklist: ,, timeout performed, Correct Patient, Correct Site, Correct Laterality, Correct Procedure, Correct Position, site marked, Risks and benefits discussed,  Surgical consent,  Pre-op evaluation,  At surgeon's request and post-op pain management  Laterality: Right  Prep: chloraprep       Needles:  Injection technique: Single-shot  Needle Type: Echogenic Stimulator Needle     Needle Length: 4cm 4 cm Needle Gauge: 21 and 21 G    Additional Needles:  Procedures: ultrasound guided (picture in chart) Supraclavicular block Narrative:  Injection made incrementally with aspirations every 5 mL.  Performed by: Personally  Anesthesiologist: JUDD, BENJAMIN  Additional Notes: Risks, benefits and alternative to block explained extensively.  Patient tolerated procedure well, without complications.   Procedure Name: LMA Insertion Date/Time: 07/15/2015 1:30 PM Performed by: Barkley Boards L Pre-anesthesia Checklist: Patient identified, Emergency Drugs available, Suction available and Patient being monitored Patient Re-evaluated:Patient Re-evaluated prior to inductionOxygen Delivery Method: Circle system utilized Preoxygenation: Pre-oxygenation with 100% oxygen Intubation Type: IV induction Ventilation: Mask ventilation without difficulty LMA: LMA inserted LMA Size: 4.0 Tube size: 4.0 mm Number of attempts: 1 Placement Confirmation: positive ETCO2,  CO2 detector and breath sounds checked- equal and bilateral Tube secured with: Tape Dental Injury: Teeth and Oropharynx as per pre-operative assessment

## 2015-07-15 NOTE — H&P (Signed)
Anthony Hebert is an 64 y.o. male.   Chief Complaint: Median and ulnar nerve compression with loss of function including loss of intrinsic and thenar muscle HPI: Patient presents for surgical reconstruction of his right upper extremity.  He understands risk benefits.  We're planning in extensive median and ulnar nerve release with a supercharged AIN to deep motor branch ulnar nerve nerve transfer  He understands risk and benefits and the tendon transfer procedure which we will plan as well. He denies other issues.  Past Medical History  Diagnosis Date  . Heart murmur   . Hypertension   . GERD (gastroesophageal reflux disease)     occ  . Cancer ([place])     mole melanoma lft eye  . Foot swelling     in AA 07/2013  . Difficult intubation     had trouble with intubation    shape of throat;last sx no problems per pt  . Complication of anesthesia     "woke up paralyzed from back surgery but not sure if it was from anesthesia or the procedure"   . Arthritis     Past Surgical History  Procedure Laterality Date  . Hernia repair  56    lft  . Back surgery      x7 plus stimulator  . Tonsillectomy    . Foot surgery Right     cut nerve to relieve pain  . Spinal cord stimulator removal N/A 09/19/2012    Procedure: LUMBAR SPINAL CORD STIMULATOR REMOVAL;  Surgeon: Faythe Ghee, MD;  Location: [place] [facility];  Service: Neurosurgery;  Laterality: N/A;  . Posterior cervical laminectomy Right 10/07/2013    Procedure: POSTERIOR CERVICAL LAMINECTOMY RIGHT CERVICAL SEVEN -THORACIC ONE FORAMINOTOMY;  Surgeon: Faythe Ghee, MD;  Location: [place] [facility];  Service: Neurosurgery;  Laterality: Right;  . Colonoscopy      Family History  Problem Relation Age of Onset  . Alzheimer's disease Father   . Cancer Mother   . Colon cancer Neg Hx    Social History:  reports that he has never smoked. He has never used smokeless tobacco. He reports that he drinks about 1.8 oz of alcohol per week. He reports  that he does not use illicit drugs.  Allergies: No Known Allergies  Medications Prior to Admission  Medication Sig Dispense Refill  . allopurinol (ZYLOPRIM) 300 MG tablet Take 300 mg by mouth daily as needed (GOUT).    Marland Kitchen lisinopril (PRINIVIL,ZESTRIL) 20 MG tablet Take 20 mg by mouth daily.    . simvastatin (ZOCOR) 40 MG tablet Take 40 mg by mouth every evening.      No results found for this or any previous visit (from the past 48 hour(s)). No results found.  Review of Systems  Constitutional: Negative.   Eyes: Negative.   Respiratory: Negative.   Gastrointestinal: Negative.   Neurological: Negative.   Psychiatric/Behavioral: Negative.     Blood pressure 107/74, pulse 77, temperature 97.6 F (36.4 C), temperature source Oral, resp. rate 14, height 5\' 6"  (1.676 m), weight 85.866 kg (189 lb 4.8 oz), SpO2 100 %. Physical Exam  Intrinsic and thenar atrophy right hand with loss of sensation. He has normal pulses no evidence of infection or dystrophy no evidence of obvious vascular compromise. He does have an extensive cervical history and his had surgery in the past as detailed in my chart  The patient is alert and oriented in no acute distress. The patient complains of pain in the affected upper  extremity.  The patient is noted to have a normal HEENT exam. Lung fields show equal chest expansion and no shortness of breath. Abdomen exam is nontender without distention. Lower extremity examination does not show any fracture dislocation or blood clot symptoms. Pelvis is stable and the neck and back are stable and nontender. Assessment/Plan Will plan for the above-mentioned surgical algorithm. Median and ulnar nerve release with supercharged nerve transfer and a tendon transfer to restore abduction to the thumb. I discussed with him risk and benefits.  We are planning surgery for your upper extremity. The risk and benefits of surgery to include risk of bleeding, infection, anesthesia,   damage to normal structures and failure of the surgery to accomplish its intended goals of relieving symptoms and restoring function have been discussed in detail. With this in mind we plan to proceed. I have specifically discussed with the patient the pre-and postoperative regime and the dos and don'ts and risk and benefits in great detail. Risk and benefits of surgery also include risk of dystrophy(CRPS), chronic nerve pain, failure of the healing process to go onto completion and other inherent risks of surgery The relavent the pathophysiology of the disease/injury process, as well as the alternatives for treatment and postoperative course of action has been discussed in great detail with the patient who desires to proceed.  We will do everything in our power to help you (the patient) restore function to the upper extremity. It is a pleasure to see this patient today.   Paulene Floor 07/15/2015, 4:08 PM

## 2015-07-15 NOTE — Anesthesia Postprocedure Evaluation (Signed)
Anesthesia Post Note  Patient: Anthony Hebert  Procedure(s) Performed: Procedure(s) (LRB): RIGHT FOREARM AND WRIST CARPAL TUNNEL RELEASE (Right) RIGHT GUYON'S CANAL RELEASE (Right) RIGHT SUPER  CHARGED AIN TO DEEP  ULNAR NERVE ELBOW AND NERVE TRANSFER, POSSIBLE CHAMITZ TENDON TRANSFER  (Right)  Patient location during evaluation: PACU Anesthesia Type: General Level of consciousness: awake and alert Pain management: pain level controlled Vital Signs Assessment: post-procedure vital signs reviewed and stable Respiratory status: spontaneous breathing, nonlabored ventilation, respiratory function stable and patient connected to nasal cannula oxygen Cardiovascular status: blood pressure returned to baseline and stable Postop Assessment: no signs of nausea or vomiting Anesthetic complications: no    Last Vitals:  Filed Vitals:   07/15/15 1615 07/15/15 1630  BP: 106/63 122/60  Pulse: 71 68  Temp:  36.6 C  Resp: 12 12    Last Pain: There were no vitals filed for this visit.               Zenaida Deed

## 2015-07-15 NOTE — Anesthesia Preprocedure Evaluation (Addendum)
Anesthesia Evaluation  Patient identified by MRN, date of birth, ID band Patient awake    Reviewed: Allergy & Precautions, H&P , NPO status , Patient's Chart, lab work & pertinent test results  History of Anesthesia Complications (+) DIFFICULT AIRWAY and history of anesthetic complications  Airway Mallampati: IV  TM Distance: >3 FB Neck ROM: Full  Mouth opening: Limited Mouth Opening Comment: Large beard present Dental  (+) Teeth Intact, Dental Advisory Given   Pulmonary    Pulmonary exam normal breath sounds clear to auscultation       Cardiovascular hypertension, Pt. on medications Normal cardiovascular exam Rhythm:Regular     Neuro/Psych    GI/Hepatic GERD  Medicated and Controlled,  Endo/Other    Renal/GU      Musculoskeletal  (+) Arthritis ,   Abdominal   Peds  Hematology   Anesthesia Other Findings   Reproductive/Obstetrics                          Anesthesia Physical  Anesthesia Plan  ASA: II  Anesthesia Plan: Regional and General   Post-op Pain Management:    Induction: Intravenous  Airway Management Planned: LMA  Additional Equipment:   Intra-op Plan:   Post-operative Plan: Extubation in OR  Informed Consent: I have reviewed the patients History and Physical, chart, labs and discussed the procedure including the risks, benefits and alternatives for the proposed anesthesia with the patient or authorized representative who has indicated his/her understanding and acceptance.   Dental advisory given  Plan Discussed with: CRNA, Surgeon and Anesthesiologist  Anesthesia Plan Comments: (supraclav block, can try mac with propofol infusion,, LMA if needed)      Anesthesia Quick Evaluation

## 2015-07-15 NOTE — Op Note (Signed)
See [id number] Amedeo Plenty MD

## 2015-07-16 ENCOUNTER — Encounter (HOSPITAL_COMMUNITY): Payer: Self-pay | Admitting: Orthopedic Surgery

## 2015-07-16 DIAGNOSIS — G5621 Lesion of ulnar nerve, right upper limb: Secondary | ICD-10-CM | POA: Diagnosis not present

## 2015-07-16 MED ORDER — HYDROCODONE-ACETAMINOPHEN 5-325 MG PO TABS
2.0000 | ORAL_TABLET | ORAL | Status: DC | PRN
  Administered 2015-07-16 – 2015-07-17 (×4): 2 via ORAL
  Filled 2015-07-16 (×4): qty 2

## 2015-07-16 MED ORDER — ACETAMINOPHEN 325 MG PO TABS
650.0000 mg | ORAL_TABLET | Freq: Once | ORAL | Status: AC
  Administered 2015-07-16: 650 mg via ORAL
  Filled 2015-07-16: qty 2

## 2015-07-16 NOTE — Progress Notes (Signed)
Patient ID: Anthony Hebert, male   DOB: [birth year], 64 y.o.   MRN: [id number] Patient is alert and oriented.  The Percocet is making him a little bit lightheaded.  I removed his drain at bedside.  He has intact sensation and motor function. There is no signs of infection dystrophy or vascular compromise.  Overall think he looks quite well given the operation.  We will switch him to hydrocodone for pain in hopes that this will make his dizziness better.  I'll plan to see how he looks tomorrow and consider discharge.  We will continue IV medicines as noted in the chart and move forward accordingly.  I went over all issues at length including his intraoperative findings and plans for the future  Edmund Holcomb M.D.

## 2015-07-16 NOTE — Progress Notes (Signed)
PT Cancellation Note  Patient Details Name: Anthony Hebert MRN: [id number] DOB: Mar 16, 1951   Cancelled Treatment:    Reason Eval/Treat Not Completed: PT screened, seen by OT and discussed status and briefly spoke with pt and no needs identified, will sign off.    [name] 07/16/2015, 11:22 AM

## 2015-07-16 NOTE — Op Note (Signed)
NAMEDEANGLO, TRUMBAUER NO.:  [id number]  MEDICAL RECORD NO.:  [id number]  LOCATION:  5N07C                        FACILITY:  [facility]  PHYSICIAN:  Satira Anis. Shenae Bonanno, M.D.DATE OF BIRTH:  Sep 04, 1950  DATE OF PROCEDURE: DATE OF DISCHARGE:                              OPERATIVE REPORT   PREOPERATIVE DIAGNOSES: 1. Ulnar nerve dysfunction/neuropathy, right upper extremity, with     intrinsic atrophy. 2. Median nerve compression with thenar loss (abductor pollicis brevis     muscle atrophy), right upper extremity. 3. History of extensive cervical surgery.  POSTOPERATIVE DIAGNOSES: 1. Ulnar nerve dysfunction/neuropathy, right upper extremity, with     intrinsic atrophy. 2. Median nerve compression with thenar loss (abductor pollicis brevis     muscle atrophy), right upper extremity. 3. History of extensive cervical surgery.  PROCEDURE: 1. Ulnar nerve release in situ, right elbow. 2. Guyon's canal release, right wrist (ulnar nerve release at the     wrist). 3. Supercharged anterior interosseous nerve, transfer to the deep     motor branch of the ulnar nerve (SETS procedure). 4. Flexor digitorum profundus tendon transfer of the small and ring     finger to the middle finger with a FiberWire and a side to side     tenodesis. 5. Open extensive carpal tunnel release. 6. Median nerve neurolysis and dissection of a bifurcated nerve with     separate pathway noted through the transverse carpal ligament,     involving the thenar branches. 7. Chamitz tendon transfer, right hand (palmaris longus transfer to     the abductor pollicis brevis).  SURGEON:  Satira Anis. Amedeo Plenty, M.D.  ASSISTANT:  Avelina Laine, PA-C.  COMPLICATIONS:  None.  ANESTHESIA:  General, preoperative block.  TOURNIQUET TIME:  45 minutes followed by 10-15 minutes deflation followed by an additional 40 minutes.  DRAINS:  One.  INDICATIONS FOR THE PROCEDURE:  The patient is a pleasant male,  who presents with the above-mentioned diagnosis.  He has had extensive cervical surgery.  I have discussed with him that certainly the extensive surgery is a concern in regard to his overall upper extremity function, nevertheless he has nerve studies in exam, which does document the additional ulnar nerve and median nerve compressive phenomenon.  We are planning the above mentioned measures to try and afford him the best environment for his nerves for the long-term feature.  I have discussed with him risks and benefits of bleeding, infection, anesthesia, damage to normal structures, and failure of surgery to accomplish its intended goals of relieving symptoms and restoring function.  With this in mind, he desires to proceed.  All questions have been encouraged and answered preoperatively.  OPERATIVE PROCEDURE:  The patient was seen by myself and Anesthesia.  He was taken to the operative theater, underwent smooth induction of general anesthesia.  Preoperatively, a block was placed and he was counseled.  In the operative arena, a time-out was observed.  He underwent a Hibiclens scrub followed by 10 minutes surgical Betadine scrub, performed by myself.  Sterile field was secured with drapes outline marks were made. Following this, the patient had insufflation of the tourniquet.  I should note the preoperative antibiotics were  given and there were no complicating features.  Following this, a curvilinear incision was made posteromedially.  I dissected down the arcade of Struthers, medial intermuscular septum, Osborne's ligament, cubital tunnel, and 2 heads of the FCU were all released without difficulty.  The patient tolerated this well.  There were no complicating features.  The nerve looked excellent in an in situ release state without difficulty.  Following this, we then very carefully and cautiously ranged him once again followed by wound closure with Prolene.  There were no  complicating features.  Once this was complete, we then turned our attention towards the patient's carpal tunnel region and extended carpal tunnel release. Incision made.  Skin flaps were elevated.  Following this, I gather the palmaris longus and placed a fiber loop around the palmaris for later transfer.  The palmaris was mobilized with tenolysis and a fasciotomy was performed proximally.  This was placed in a wet lap.  Following this, I released the transverse carpal ligament ulnarly off the hook of the hamate.  Once this was done, identified the median nerve.  The median nerve had a very unusual appearance.  He has had a 4-type bifurcation and the bifurcation circumference was equal on the medial and lateral sides.  I am asked to take an intraoperative video of this. The most thenar based branching pattern went through the transverse carpal ligament and thus we resected the transverse carpal ligament in this region and freely dissected this area away from harm.  This was certainly explained a high degree of thenar atrophy and abnormality present.  With this noted, we made sure that the median nerve was tension free. We performed a neurolysis and both the __________ areas about the median nerve distally were completely released.  Proximally, I resected a portion of the transverse carpal ligament and sutured the transverse carpal ligament in just 1 area against the subcu, so as not to provide any excessive _______ over the nerve itself.  Additional release and fasciotomy was performed.  The median nerve looked highly compressed, but I was very pleased with the decompression.  Following this, I then released Guyon's canal, this was done in limited fashion, but certainly the most proximal aspect and stout bands were released in their entirety.  The median nerve and ulnar were all carefully protected during these endeavors.  Following this, we deflated the tourniquet and then  tunneled the palmaris longus through the subcutaneous tissue well away from the pathway of the median nerve, so that there would be no secondary compression.  The patient had a counter incision made over the APB tendon over the radial aspect of the thenar region and Pulvertaft weave was used to weave this tendon into place and suture it.  Once this was complete, I then checked the tension multiple times, it looked well and I was pleased.  This completed the Chamitz tendon transfer of the palmaris longus to the APB.  We then sutured the wounds close.  A TLS drain was placed in the carpal tunnel region.  Once again, this is a very impressive anatomic variant.  Following this, we then reinflated the tourniquet, after greater than 15 minute deflation time and made a modified incision about the forearm pre- marked with a marking pen.  Dissection was carried down, ulnar nerve and artery and FCU were identified.  Following this, I dissected down very carefully, noted the dorsal sensory branch takeoff of the ulnar nerve, approximately 9 cm proximal to the distal wrist crease.  We identified the cleft between the deep motor branching, topographical anatomy in the sensory topography of the ulnar nerve.  Following this, we then carefully lifted up the flexor pronator mass and harvested the anterior interosseous nerve from the pronator quadratus region.  This was mobilized and then transferred over to the ulnar nerve.  The patient had some fibrin glue placed against the pronator and hemostasis was secured.  Following this, we performed a tenodesis of the FDP of the small and ring finger to the FDP of the middle finger.  This was done with FiberWire suture without difficulty and was a tendon transfer/FDP tenodesis of the aforementioned ring and small to the middle finger.  Following this, we then very carefully and cautiously performed transfer of the AIN nerve to the deep motor branch of the  ulnar nerve.  A very small epineurial incision was made and carefully the AIN was mobilized. It was then sutured with 8-0 nylon and a small amount of fibrin glue was placed at the coaptation site.  He sat tension free.  We did range the wrist, forearm, and the finger flexors to make sure that all looked well and it did.  I was pleased with this.  Following this, we deflated the tourniquet, irrigated and closed the wound with Prolene.  He was placed on long-arm splint.  He had excellent refill, soft compartments, and no complicating features.  We will be admitting [facility] for IV antibiotics, general postop observation, pain management, and other measures.  Should problems arise, we will be immediately available, otherwise we are going to rehab him according to our standard postop protocol.  Do's and don'ts have been discussed and all questions have been encouraged and answered.     Satira Anis. Amedeo Plenty, M.D.     [facility]  D:  07/15/2015  T:  07/16/2015  Job:  [id number]

## 2015-07-16 NOTE — Evaluation (Signed)
Occupational Therapy Evaluation Patient Details Name: Anthony Hebert MRN: [id number] DOB: 07-19-1951 Today's Date: 07/16/2015    History of Present Illness s/p R carpal tunnel release, chamitz tendon transfer, ulnar nerve transfer and guyons canal release due to R ulnar nerve neuropathy, median nerve compression with intrinsic atrophy    Clinical Impression   Educated pt in compensatory strategies for ADL, use of sling, edema management,precautions and safety. Pt is mobilizing at a modified independent level, encouraged cane use outside on uneven surfaces.  Pt verbalizing understanding of all .  No further acute OT needs.    Follow Up Recommendations  No OT follow up    Equipment Recommendations  None recommended by OT    Recommendations for Other Services       Precautions / Restrictions Precautions Precaution Comments: order is specific for elevation, edema control and ADL training Required Braces or Orthoses: Sling Restrictions Weight Bearing Restrictions: Yes RUE Weight Bearing: Non weight bearing      Mobility Bed Mobility Overal bed mobility: Modified Independent                Transfers Overall transfer level: Modified independent                    Balance                                            ADL                                         General ADL Comments: Educated pt in compensatory strategies for self care.  Pt is heavily dependent on R UE to steady himself when transferring to tub, recommended pt sponge bathe vs use of tub bench when MD agrees with showering.  Instructed in sling donning and doffing using countertop. Educated in edema management of elevation of R UE above heart (3 pillows), icing, and moving fingers and shoulder actively.  Educated pt in NWB status and to avoid carrying items with R hand and pushing and pulling also.  Pt verbalizing understanding of all.  Son can assist with IADL.      Vision     Perception     Praxis      Pertinent Vitals/Pain Pain Assessment: 0-10 Pain Score: 3  Pain Location: RUE Pain Descriptors / Indicators: Aching Pain Intervention(s): Limited activity within patient's tolerance;Monitored during session;Premedicated before session;Repositioned;Ice applied     Hand Dominance Right   Extremity/Trunk Assessment Upper Extremity Assessment Upper Extremity Assessment: RUE deficits/detail RUE Deficits / Details: full AROM shoulder and fingers, immobilized thumb and from MPs to proximal to ellbow RUE: Unable to fully assess due to immobilization RUE Sensation:  (dullness of light touch on R first finger) RUE Coordination: decreased fine motor;decreased gross motor   Lower Extremity Assessment Lower Extremity Assessment: Overall WFL for tasks assessed   Cervical / Trunk Assessment Cervical / Trunk Assessment:  (hx of multiple back surgeries)   Communication Communication Communication: No difficulties   Cognition Arousal/Alertness: Awake/alert Behavior During Therapy: WFL for tasks assessed/performed Overall Cognitive Status: Within Functional Limits for tasks assessed                     General Comments  Exercises       Shoulder Instructions      Home Living Family/patient expects to be discharged to:: Private residence Living Arrangements: Alone Available Help at Discharge: Friend(s);Available PRN/intermittently Type of Home: House Home Access: Ramped entrance     Home Layout: One level     Bathroom Shower/Tub: Tub/shower unit Shower/tub characteristics: Curtain Biochemist, clinical: Standard     Home Equipment: Environmental consultant - 2 wheels;Cane - single point;Hand held shower head;Adaptive equipment Adaptive Equipment: Reacher;Long-handled sponge        Prior Functioning/Environment Level of Independence: Independent             OT Diagnosis: Generalized weakness;Acute pain   OT Problem List:      OT Treatment/Interventions:      OT Goals(Current goals can be found in the care plan section) Acute Rehab OT Goals Patient Stated Goal: home today  OT Frequency:     Barriers to D/C:            Co-evaluation              End of Session    Activity Tolerance: Patient tolerated treatment well Patient left: in bed;with call bell/phone within reach   Time: 0929-1012 OT Time Calculation (min): 43 min Charges:  OT General Charges $OT Visit: 1 Procedure OT Evaluation $Initial OT Evaluation Tier I: 1 Procedure OT Treatments $Self Care/Home Management : 23-37 mins G-Codes:    Malka So 07/16/2015, 11:34 AM  [phone]

## 2015-07-17 DIAGNOSIS — G5621 Lesion of ulnar nerve, right upper limb: Secondary | ICD-10-CM | POA: Diagnosis not present

## 2015-07-17 MED ORDER — METHOCARBAMOL 500 MG PO TABS: 500.0000 mg | ORAL_TABLET | Freq: Four times a day (QID) | ORAL | Status: DC | PRN

## 2015-07-17 MED ORDER — HYDROCODONE-ACETAMINOPHEN 5-325 MG PO TABS: 2.0000 | ORAL_TABLET | ORAL | Status: DC | PRN

## 2015-07-17 NOTE — Discharge Instructions (Signed)
Please cause for any problems  Dr. Vanetta Shawl office will call for your follow-up  Keep bandage clean and dry.  Call for any problems.  No smoking.  Criteria for driving a car: you should be off your pain medicine for 7-8 hours, able to drive one handed(confident), thinking clearly and feeling able in your judgement to drive. Continue elevation as it will decrease swelling.  If instructed by MD move your fingers within the confines of the bandage/splint.  Use ice if instructed by your MD. Call immediately for any sudden loss of feeling in your hand/arm or change in functional abilities of the extremity.We recommend that you to take vitamin C 1000 mg a day to promote healing. We also recommend that if you require  pain medicine that you take a stool softener to prevent constipation as most pain medicines will have constipation side effects. We recommend either Peri-Colace or Senokot and recommend that you also consider adding MiraLAX to prevent the constipation affects from pain medicine if you are required to use them. These medicines are over the counter and maybe purchased at a local pharmacy. A cup of yogurt and a probiotic can also be helpful during the recovery process as the medicines can disrupt your intestinal environment.

## 2015-07-17 NOTE — Discharge Summary (Signed)
  Patient has been seen and examined. Patient has pain appropriate to his injury/process. Patient denies new complaints at this present time. I have discussed the care pathway with nursing staff. Patient is appropriate and alert.  We reviewed vital signs and intake output which are stable.  The upper extremity is neurovascularly intact. Refill is normal. There is no signs of compartment syndrome. There is no signs of dystrophy. There is normal sensation.  I have spent a  great deal of time discussing range of motion edema control and other techniques to decrease edema and promote flexion extension of the fingers. Patient understands the importance of elevation range of motion massage and other measures to lessen pain and prevent swelling.  We have also discussed immobilization to appropriate areas involved.  We have discussed with the patient shoulder range of motion to prevent adhesive capsulitis.  The remainder of the examination is normal today without complicating feature.    Patient will be discharged home. Will plan to see the patient back in the office as per discharge instructions (please see discharge instructions).  Patient had an uneventful hospital course. At the time of discharge patient is stable awake alert and oriented in no acute distress. Regular diet will be continued and has been tolerated. Patient will notify should have problems occur. There is no signs of DVT infection or other complication at this juncture.  All questions have been incurred and answered.  Please see discharge med list Final diagnosis status post ulnar nerve release and supercharged transfer of the AIN to the deep motor branch of the ulnar nerve. Status post reconstruction palmaris longus tendon transfer to abductor pollicis brevis of the thenar region with associated carpal tunnel release extensive in nature   Ugonna Keirsey MD

## 2015-07-17 NOTE — Progress Notes (Signed)
Discharge instructions gave to patient and alll questions answered. Patient is ready to discharge.

## 2015-07-28 ENCOUNTER — Encounter (HOSPITAL_COMMUNITY): Payer: Self-pay | Admitting: Orthopedic Surgery

## 2015-08-24 ENCOUNTER — Encounter (HOSPITAL_COMMUNITY): Payer: Self-pay | Admitting: Orthopedic Surgery

## 2015-08-25 ENCOUNTER — Encounter (HOSPITAL_COMMUNITY): Payer: Self-pay | Admitting: Orthopedic Surgery

## 2016-03-03 ENCOUNTER — Other Ambulatory Visit: Payer: Self-pay | Admitting: General Surgery

## 2016-03-22 ENCOUNTER — Encounter (HOSPITAL_COMMUNITY): Payer: Self-pay

## 2016-03-22 ENCOUNTER — Encounter (HOSPITAL_COMMUNITY)
Admission: RE | Admit: 2016-03-22 | Discharge: 2016-03-22 | Disposition: A | Discharge status: Home / Self Care | Payer: BLUE CROSS/BLUE SHIELD | Source: Ambulatory Visit | Attending: General Surgery | Admitting: General Surgery

## 2016-03-22 ENCOUNTER — Other Ambulatory Visit (HOSPITAL_COMMUNITY): Payer: Self-pay | Admitting: *Deleted

## 2016-03-22 DIAGNOSIS — Z85820 Personal history of malignant melanoma of skin: Secondary | ICD-10-CM | POA: Diagnosis not present

## 2016-03-22 DIAGNOSIS — I10 Essential (primary) hypertension: Secondary | ICD-10-CM | POA: Diagnosis not present

## 2016-03-22 DIAGNOSIS — Z79899 Other long term (current) drug therapy: Secondary | ICD-10-CM | POA: Diagnosis not present

## 2016-03-22 DIAGNOSIS — K429 Umbilical hernia without obstruction or gangrene: Secondary | ICD-10-CM | POA: Insufficient documentation

## 2016-03-22 DIAGNOSIS — K219 Gastro-esophageal reflux disease without esophagitis: Secondary | ICD-10-CM | POA: Insufficient documentation

## 2016-03-22 DIAGNOSIS — Z01818 Encounter for other preprocedural examination: Secondary | ICD-10-CM | POA: Insufficient documentation

## 2016-03-22 DIAGNOSIS — Z981 Arthrodesis status: Secondary | ICD-10-CM | POA: Diagnosis not present

## 2016-03-22 DIAGNOSIS — Z01812 Encounter for preprocedural laboratory examination: Secondary | ICD-10-CM | POA: Diagnosis not present

## 2016-03-22 LAB — BASIC METABOLIC PANEL
ANION GAP: 5 (ref 5–15)
BUN: 14 mg/dL (ref 6–20)
CALCIUM: 9.4 mg/dL (ref 8.9–10.3)
CO2: 26 mmol/L (ref 22–32)
CREATININE: 1.04 mg/dL (ref 0.61–1.24)
Chloride: 108 mmol/L (ref 101–111)
GFR calc Af Amer: >60 mL/min (ref >60)
GLUCOSE: 83 mg/dL (ref 65–99)
Potassium: 4.2 mmol/L (ref 3.5–5.1)
Sodium: 139 mmol/L (ref 135–145)

## 2016-03-22 LAB — CBC
HCT: 42.1 % (ref 39.0–52.0)
Hemoglobin: 14.3 g/dL (ref 13.0–17.0)
MCH: 31.4 pg (ref 26.0–34.0)
MCHC: 34 g/dL (ref 30.0–36.0)
MCV: 92.3 fL (ref 78.0–100.0)
PLATELETS: 156 10*3/uL (ref 150–400)
RBC: 4.56 MIL/uL (ref 4.22–5.81)
RDW: 12.5 % (ref 11.5–15.5)
WBC: 5.6 10*3/uL (ref 4.0–10.5)

## 2016-03-22 NOTE — Progress Notes (Signed)
   03/22/16 0930  OBSTRUCTIVE SLEEP APNEA  Have you ever been diagnosed with sleep apnea through a sleep study? No  Do you snore loudly (loud enough to be heard through closed doors)?  1  Do you often feel tired, fatigued, or sleepy during the daytime (such as falling asleep during driving or talking to someone)? 0  Has anyone observed you stop breathing during your sleep? 0  Do you have, or are you being treated for high blood pressure? 1  BMI more than 35 kg/m2? 0  Age > 50 (1-yes) 1  Neck circumference greater than:Male 16 inches or larger, Male 17inches or larger? 1  Male Gender (Yes=1) 1  Obstructive Sleep Apnea Score 5  Score 5 or greater  Results sent to PCP

## 2016-03-22 NOTE — Progress Notes (Signed)
Pt has history of heart murmur, last visit to Dr. Irish Lack was approx 2 years ago and pt states he was told he didn't have to return. States heart murmur has never given him any problems. His PCP, Corine Shelter, PA listened to it a few months ago and felt that nothing had changed. Pt does have a history of difficult intubation. He states since they have started using a smaller tube and a glidescope, he's not had any difficulty.

## 2016-03-22 NOTE — Pre-Procedure Instructions (Signed)
Anthony Hebert  03/22/2016    Your procedure is scheduled on Tuesday, March 28, 2016 at 9:15 AM.   Report to [facility] Entrance "A" Admitting Office at 7:15 AM.   Call this number if you have problems the morning of surgery: [phone]   Any questions prior to day of surgery, please call [phone] between 8 & 4 PM.   Remember:  Do not eat food or drink liquids after midnight Monday, 03/27/16.    Do not wear jewelry.  Do not wear lotions, powders, or cologne.  You may wear deodorant.  Men may shave face and neck.  Do not bring valuables to the hospital.  [facility] is not responsible for any belongings or valuables.  Contacts, dentures or bridgework may not be worn into surgery.  Leave your suitcase in the car.  After surgery it may be brought to your room.  For patients admitted to the hospital, discharge time will be determined by your treatment team.  Patients discharged the day of surgery will not be allowed to drive home.   Special instructions:  [facility] - Preparing for Surgery  Before surgery, you can play an important role.  Because skin is not sterile, your skin needs to be as free of germs as possible.  You can reduce the number of germs on you skin by washing with CHG (chlorahexidine gluconate) soap before surgery.  CHG is an antiseptic cleaner which kills germs and bonds with the skin to continue killing germs even after washing.  Please DO NOT use if you have an allergy to CHG or antibacterial soaps.  If your skin becomes reddened/irritated stop using the CHG and inform your nurse when you arrive at Short Stay.  Do not shave (including legs and underarms) for at least 48 hours prior to the first CHG shower.  You may shave your face.  Please follow these instructions carefully:   1.  Shower with CHG Soap the night before surgery and the                    morning of Surgery.  2.  If you choose to wash your hair, wash your hair first as usual with  your       normal shampoo.  3.  After you shampoo, rinse your hair and body thoroughly to remove the shampoo.  4.  Use CHG as you would any other liquid soap.  You can apply chg directly       to the skin and wash gently with scrungie or a clean washcloth.  5.  Apply the CHG Soap to your body ONLY FROM THE NECK DOWN.        Do not use on open wounds or open sores.  Avoid contact with your eyes, ears, mouth and genitals (private parts).  Wash genitals (private parts) with your normal soap.  6.  Wash thoroughly, paying special attention to the area where your surgery        will be performed.  7.  Thoroughly rinse your body with warm water from the neck down.  8.  DO NOT shower/wash with your normal soap after using and rinsing off       the CHG Soap.  9.  Pat yourself dry with a clean towel.            10.  Wear clean pajamas.            11.  Place clean  sheets on your bed the night of your first shower and do not        sleep with pets.  Day of Surgery  Do not apply any lotions the morning of surgery.  Please wear clean clothes to the hospital.   Please read over the following fact sheets that you were given. Pain Booklet, Coughing and Deep Breathing and Surgical Site Infection Prevention

## 2016-03-23 NOTE — Progress Notes (Signed)
Anesthesia Chart Review: Patient is a 65 year old male scheduled for laparoscopic umbilical hernia repair with mesh on 03/28/16 by Dr. Donne Hazel.  History includes DIFFICULT AIRWAY, non-smoker, HTN, GERD, melanoma (left eye), heart murmur with aortic valve sclerosis and trivial TR by 07/2012 echo, removal of spinal cord stimulator '14, posterior C7-T1 foraminotomy 10/07/13, right ulnar nerve release/Guyon's canal release/CTR/flexor digitorum profundus tendon transfer/median nerve neurolysis/Cahmitz tendon transfer 07/15/15 ([place] with LMA). I saw him back on 09/17/12 for a pre-operative evaluation due to difficult airway history. He reported possible CVA after back surgery in the '90's, but denied known CAD. However, per cardiology notes in 2014 by Dr. Irish Lack, patient has a history of MI ~ 20 years ago after a back surgery. Unclear if this was miscommunication since at PAT patient reported a possible "CVA" (not "MI"). He also denied prior heart tests until his echo in 2013 that showed no regional wall motion abnormalities. Dr. Irish Lack last saw him on 09/24/14 with patient instructions indicating "follow-up appointment as needed with Dr. Irish Lack." Dr. Irish Lack cleared patient for surgery at that time, although the type of surgery was not specified. OSA screening score was elevated at 5.   PCP is listed as Librarian, academic, PA-C.   Per my 09/17/12 note and subsequent anesthesia records: He was seen by Anesthesiologist Dr. Linna Caprice prior to his surgery in August 2012. At that time patient reported a previous awake nasal intubation, but later had a successful intubation with glidescope. On 03/21/11, he underwent right L1-2 fusion. He had post-operative weakness and was taken back to the OR that same day for wound exploration. He was intubated for both of these procedures using a glidescope and rigid stylet. His airway was noted to be very anterior with small opening. Following his second surgery, he remained intubated due  to low respiratory rate and periods of apnea, negative cuff leak. He was seen by Critical Care and ultimately extubated on POD #1. According to [facility] notes, there was question if undiagnosed OSA contributed to his post-operative respiratory failure as he had no known history of pulmonary disease. He had not had a sleep study.  On 09/19/12 MAC 4 with Bougie stylet used to place 36mm ETT. On 10/07/13, IV induction for GETA using rigid stylet with video laryngoscopy was used. Recommend- induction with short-acting agent, and alternative techniques readily available.  Meds include vitamin C, Colcrys, lisinopril, Zocor.  BP 131/68   Pulse 70   Temp 36.8 C   Resp 20   Ht 5\' 6"  (1.676 m)   Wt 191 lb 12.8 oz (87 kg)   SpO2 100%   BMI 30.96 kg/m   03/22/16 EKG: NSR (confirmed by Dr. Sallyanne Kuster). Other EKGs dating back to at least 03/21/11 have intermittently shown possible inferior infarct (Q waves in lead III, +/- aVF which is lower in voltage). Overall, I think his EKGs have remained stable since at least 03/21/11.   08/02/12 Echo: LVEF 60-65%, aortic valve sclerosis but opens well. Trivial TR.  Preoperative labs noted.  Patient was discharged from cardiology last year. EKG appears stable. He does have a history of difficult intubation but successful intubation with glidescope in the recent past. Multiple anesthesia records available for review in Epic. If no acute changes then I anticipate that he can proceed as planned. Definitive anesthesia plan following evaluation on the day of surgery.  George Hugh [facility] Short Stay Center/Anesthesiology Phone [phone] 03/23/2016 2:40 PM

## 2016-03-27 MED ORDER — CEFAZOLIN SODIUM-DEXTROSE 2-4 GM/100ML-% IV SOLN
2.0000 g | INTRAVENOUS | Status: AC
Start: 2016-03-28 — End: 2016-03-28
  Administered 2016-03-28: 2 g via INTRAVENOUS
  Filled 2016-03-27: qty 100

## 2016-03-27 MED ORDER — ACETAMINOPHEN 500 MG PO TABS
1000.0000 mg | ORAL_TABLET | ORAL | Status: AC
  Administered 2016-03-28: 1000 mg via ORAL
  Filled 2016-03-27: qty 2

## 2016-03-28 ENCOUNTER — Encounter (HOSPITAL_COMMUNITY)
Admission: RE | Disposition: A | Discharge status: Home / Self Care | Payer: Self-pay | Source: Ambulatory Visit | Attending: General Surgery

## 2016-03-28 ENCOUNTER — Ambulatory Visit (HOSPITAL_COMMUNITY): Payer: BLUE CROSS/BLUE SHIELD | Admitting: Vascular Surgery

## 2016-03-28 ENCOUNTER — Observation Stay (HOSPITAL_COMMUNITY)
Admission: RE | Admit: 2016-03-28 | Discharge: 2016-03-29 | Disposition: A | Discharge status: Home / Self Care | Payer: BLUE CROSS/BLUE SHIELD | Source: Ambulatory Visit | Attending: General Surgery | Admitting: General Surgery

## 2016-03-28 ENCOUNTER — Ambulatory Visit (HOSPITAL_COMMUNITY): Payer: BLUE CROSS/BLUE SHIELD | Admitting: Anesthesiology

## 2016-03-28 DIAGNOSIS — I501 Left ventricular failure: Secondary | ICD-10-CM | POA: Diagnosis not present

## 2016-03-28 DIAGNOSIS — I110 Hypertensive heart disease with heart failure: Secondary | ICD-10-CM | POA: Diagnosis not present

## 2016-03-28 DIAGNOSIS — M1990 Unspecified osteoarthritis, unspecified site: Secondary | ICD-10-CM | POA: Insufficient documentation

## 2016-03-28 DIAGNOSIS — I2510 Atherosclerotic heart disease of native coronary artery without angina pectoris: Secondary | ICD-10-CM | POA: Diagnosis not present

## 2016-03-28 DIAGNOSIS — I350 Nonrheumatic aortic (valve) stenosis: Secondary | ICD-10-CM | POA: Diagnosis not present

## 2016-03-28 DIAGNOSIS — K429 Umbilical hernia without obstruction or gangrene: Secondary | ICD-10-CM | POA: Diagnosis not present

## 2016-03-28 DIAGNOSIS — Z8673 Personal history of transient ischemic attack (TIA), and cerebral infarction without residual deficits: Secondary | ICD-10-CM | POA: Insufficient documentation

## 2016-03-28 DIAGNOSIS — K439 Ventral hernia without obstruction or gangrene: Secondary | ICD-10-CM | POA: Diagnosis present

## 2016-03-28 HISTORY — PX: INSERTION OF MESH: SHX5868

## 2016-03-28 HISTORY — PX: UMBILICAL HERNIA REPAIR: SHX196

## 2016-03-28 SURGERY — REPAIR, HERNIA, UMBILICAL, LAPAROSCOPIC
Anesthesia: General | Site: Abdomen

## 2016-03-28 MED ORDER — LIDOCAINE 2% (20 MG/ML) 5 ML SYRINGE
INTRAMUSCULAR | Status: AC
  Filled 2016-03-28: qty 5

## 2016-03-28 MED ORDER — PHENYLEPHRINE 40 MCG/ML (10ML) SYRINGE FOR IV PUSH (FOR BLOOD PRESSURE SUPPORT)
PREFILLED_SYRINGE | INTRAVENOUS | Status: AC
  Filled 2016-03-28: qty 10

## 2016-03-28 MED ORDER — ONDANSETRON 4 MG PO TBDP: 4.0000 mg | ORAL_TABLET | Freq: Four times a day (QID) | ORAL | Status: DC | PRN

## 2016-03-28 MED ORDER — CHLORHEXIDINE GLUCONATE CLOTH 2 % EX PADS: 6.0000 | MEDICATED_PAD | Freq: Once | CUTANEOUS | Status: DC

## 2016-03-28 MED ORDER — FAMOTIDINE 10 MG PO TABS: 10.0000 mg | ORAL_TABLET | Freq: Two times a day (BID) | ORAL | Status: DC | PRN

## 2016-03-28 MED ORDER — MIDAZOLAM HCL 2 MG/2ML IJ SOLN
INTRAMUSCULAR | Status: AC
  Filled 2016-03-28: qty 2

## 2016-03-28 MED ORDER — ONDANSETRON HCL 4 MG/2ML IJ SOLN
INTRAMUSCULAR | Status: AC
  Filled 2016-03-28: qty 2

## 2016-03-28 MED ORDER — PHENYLEPHRINE HCL 10 MG/ML IJ SOLN
INTRAMUSCULAR | Status: DC | PRN
  Administered 2016-03-28: 120 ug via INTRAVENOUS
  Administered 2016-03-28 (×3): 80 ug via INTRAVENOUS

## 2016-03-28 MED ORDER — FENTANYL CITRATE (PF) 250 MCG/5ML IJ SOLN
INTRAMUSCULAR | Status: AC
  Filled 2016-03-28: qty 5

## 2016-03-28 MED ORDER — ENOXAPARIN SODIUM 40 MG/0.4ML ~~LOC~~ SOLN: 40.0000 mg | SUBCUTANEOUS | Status: DC

## 2016-03-28 MED ORDER — BUPIVACAINE-EPINEPHRINE 0.25% -1:200000 IJ SOLN
INTRAMUSCULAR | Status: DC | PRN
  Administered 2016-03-28: 5 mL

## 2016-03-28 MED ORDER — ROCURONIUM BROMIDE 10 MG/ML (PF) SYRINGE
PREFILLED_SYRINGE | INTRAVENOUS | Status: AC
  Filled 2016-03-28: qty 10

## 2016-03-28 MED ORDER — ACETAMINOPHEN 325 MG PO TABS: 650.0000 mg | ORAL_TABLET | Freq: Four times a day (QID) | ORAL | Status: DC | PRN

## 2016-03-28 MED ORDER — MORPHINE SULFATE (PF) 2 MG/ML IV SOLN
2.0000 mg | INTRAVENOUS | Status: DC | PRN
  Administered 2016-03-28 (×2): 2 mg via INTRAVENOUS
  Filled 2016-03-28 (×2): qty 1

## 2016-03-28 MED ORDER — SUGAMMADEX SODIUM 500 MG/5ML IV SOLN
INTRAVENOUS | Status: AC
  Filled 2016-03-28: qty 5

## 2016-03-28 MED ORDER — HYDROMORPHONE HCL 1 MG/ML IJ SOLN
0.2500 mg | INTRAMUSCULAR | Status: DC | PRN
  Administered 2016-03-28 (×4): 0.5 mg via INTRAVENOUS

## 2016-03-28 MED ORDER — MIDAZOLAM HCL 5 MG/5ML IJ SOLN
INTRAMUSCULAR | Status: DC | PRN
  Administered 2016-03-28: 2 mg via INTRAVENOUS

## 2016-03-28 MED ORDER — COLCHICINE 0.6 MG PO TABS: 0.6000 mg | ORAL_TABLET | Freq: Three times a day (TID) | ORAL | Status: DC | PRN

## 2016-03-28 MED ORDER — DEXAMETHASONE SODIUM PHOSPHATE 10 MG/ML IJ SOLN
INTRAMUSCULAR | Status: DC | PRN
  Administered 2016-03-28: 10 mg via INTRAVENOUS

## 2016-03-28 MED ORDER — PROPOFOL 10 MG/ML IV BOLUS
INTRAVENOUS | Status: AC
  Filled 2016-03-28: qty 20

## 2016-03-28 MED ORDER — SIMETHICONE 80 MG PO CHEW: 40.0000 mg | CHEWABLE_TABLET | Freq: Four times a day (QID) | ORAL | Status: DC | PRN

## 2016-03-28 MED ORDER — KETOROLAC TROMETHAMINE 15 MG/ML IJ SOLN: 15.0000 mg | Freq: Four times a day (QID) | INTRAMUSCULAR | Status: DC | PRN

## 2016-03-28 MED ORDER — SUCCINYLCHOLINE CHLORIDE 20 MG/ML IJ SOLN: INTRAMUSCULAR | Status: DC | PRN

## 2016-03-28 MED ORDER — 0.9 % SODIUM CHLORIDE (POUR BTL) OPTIME
TOPICAL | Status: DC | PRN
  Administered 2016-03-28: 1000 mL

## 2016-03-28 MED ORDER — KETOROLAC TROMETHAMINE 30 MG/ML IJ SOLN
INTRAMUSCULAR | Status: AC
  Filled 2016-03-28: qty 1

## 2016-03-28 MED ORDER — LISINOPRIL 20 MG PO TABS: 20.0000 mg | ORAL_TABLET | Freq: Every day | ORAL | Status: DC

## 2016-03-28 MED ORDER — SUCCINYLCHOLINE CHLORIDE 200 MG/10ML IV SOSY
PREFILLED_SYRINGE | INTRAVENOUS | Status: AC
  Filled 2016-03-28: qty 10

## 2016-03-28 MED ORDER — SODIUM CHLORIDE 0.9 % IV SOLN
INTRAVENOUS | Status: DC
  Administered 2016-03-28: 15:00:00 via INTRAVENOUS

## 2016-03-28 MED ORDER — PROPOFOL 10 MG/ML IV BOLUS
INTRAVENOUS | Status: DC | PRN
  Administered 2016-03-28: 150 mg via INTRAVENOUS

## 2016-03-28 MED ORDER — HYDROCODONE-ACETAMINOPHEN 7.5-325 MG PO TABS
1.0000 | ORAL_TABLET | Freq: Once | ORAL | Status: AC | PRN
  Administered 2016-03-28: 1 via ORAL

## 2016-03-28 MED ORDER — DEXAMETHASONE SODIUM PHOSPHATE 10 MG/ML IJ SOLN
INTRAMUSCULAR | Status: AC
  Filled 2016-03-28: qty 1

## 2016-03-28 MED ORDER — OXYCODONE HCL 5 MG PO TABS
5.0000 mg | ORAL_TABLET | ORAL | Status: DC | PRN
  Administered 2016-03-28 – 2016-03-29 (×3): 10 mg via ORAL
  Filled 2016-03-28 (×3): qty 2

## 2016-03-28 MED ORDER — KETOROLAC TROMETHAMINE 30 MG/ML IJ SOLN
INTRAMUSCULAR | Status: DC | PRN
  Administered 2016-03-28: 30 mg via INTRAVENOUS

## 2016-03-28 MED ORDER — PROMETHAZINE HCL 25 MG/ML IJ SOLN: 6.2500 mg | INTRAMUSCULAR | Status: DC | PRN

## 2016-03-28 MED ORDER — ONDANSETRON HCL 4 MG/2ML IJ SOLN
INTRAMUSCULAR | Status: DC | PRN
  Administered 2016-03-28: 4 mg via INTRAVENOUS

## 2016-03-28 MED ORDER — BUPIVACAINE-EPINEPHRINE (PF) 0.25% -1:200000 IJ SOLN
INTRAMUSCULAR | Status: AC
  Filled 2016-03-28: qty 30

## 2016-03-28 MED ORDER — HYDROMORPHONE HCL 1 MG/ML IJ SOLN
INTRAMUSCULAR | Status: AC
  Administered 2016-03-28: 0.5 mg via INTRAVENOUS
  Filled 2016-03-28: qty 1

## 2016-03-28 MED ORDER — LACTATED RINGERS IV SOLN
INTRAVENOUS | Status: DC
  Administered 2016-03-28: 09:00:00 via INTRAVENOUS

## 2016-03-28 MED ORDER — SUCCINYLCHOLINE CHLORIDE 20 MG/ML IJ SOLN
INTRAMUSCULAR | Status: DC | PRN
  Administered 2016-03-28: 100 mg via INTRAVENOUS

## 2016-03-28 MED ORDER — FENTANYL CITRATE (PF) 100 MCG/2ML IJ SOLN
INTRAMUSCULAR | Status: DC | PRN
  Administered 2016-03-28: 100 ug via INTRAVENOUS

## 2016-03-28 MED ORDER — HYDROCODONE-ACETAMINOPHEN 7.5-325 MG PO TABS
ORAL_TABLET | ORAL | Status: AC
  Administered 2016-03-28: 1 via ORAL
  Filled 2016-03-28: qty 1

## 2016-03-28 MED ORDER — ONDANSETRON HCL 4 MG/2ML IJ SOLN: 4.0000 mg | Freq: Four times a day (QID) | INTRAMUSCULAR | Status: DC | PRN

## 2016-03-28 MED ORDER — LIDOCAINE HCL (CARDIAC) 20 MG/ML IV SOLN
INTRAVENOUS | Status: DC | PRN
  Administered 2016-03-28: 100 mg via INTRAVENOUS

## 2016-03-28 MED ORDER — ACETAMINOPHEN 650 MG RE SUPP
650.0000 mg | Freq: Four times a day (QID) | RECTAL | Status: DC | PRN
Start: 2016-03-28 — End: 2016-03-29

## 2016-03-28 MED ORDER — METHOCARBAMOL 500 MG PO TABS: 500.0000 mg | ORAL_TABLET | Freq: Four times a day (QID) | ORAL | Status: DC | PRN

## 2016-03-28 MED ORDER — SUGAMMADEX SODIUM 500 MG/5ML IV SOLN
INTRAVENOUS | Status: DC | PRN
  Administered 2016-03-28: 320 mg via INTRAVENOUS

## 2016-03-28 SURGICAL SUPPLY — 48 items
APPLIER CLIP 5 13 M/L LIGAMAX5 (MISCELLANEOUS)
APPLIER CLIP ROT 10 11.4 M/L (STAPLE)
CANISTER SUCTION 2500CC (MISCELLANEOUS) IMPLANT
CHLORAPREP W/TINT 26ML (MISCELLANEOUS) ×2 IMPLANT
CLIP APPLIE 5 13 M/L LIGAMAX5 (MISCELLANEOUS) IMPLANT
CLIP APPLIE ROT 10 11.4 M/L (STAPLE) IMPLANT
COVER SURGICAL LIGHT HANDLE (MISCELLANEOUS) ×2 IMPLANT
DECANTER SPIKE VIAL GLASS SM (MISCELLANEOUS) IMPLANT
DEVICE RELIATACK FIXATION (MISCELLANEOUS) ×2 IMPLANT
DEVICE SECURE STRAP 25 ABSORB (INSTRUMENTS) IMPLANT
DEVICE TROCAR PUNCTURE CLOSURE (ENDOMECHANICALS) ×2 IMPLANT
DRAPE INCISE IOBAN 66X45 STRL (DRAPES) ×2 IMPLANT
DRAPE LAPAROSCOPIC ABDOMINAL (DRAPES) ×2 IMPLANT
DRSG TEGADERM 4X4.75 (GAUZE/BANDAGES/DRESSINGS) ×2 IMPLANT
ELECT REM PT RETURN 9FT ADLT (ELECTROSURGICAL) ×2
ELECTRODE REM PT RTRN 9FT ADLT (ELECTROSURGICAL) ×1 IMPLANT
GAUZE SPONGE 2X2 8PLY STRL LF (GAUZE/BANDAGES/DRESSINGS) ×1 IMPLANT
GLOVE BIO SURGEON STRL SZ7 (GLOVE) ×2 IMPLANT
GOWN STRL REUS W/ TWL LRG LVL3 (GOWN DISPOSABLE) ×2 IMPLANT
GOWN STRL REUS W/ TWL XL LVL3 (GOWN DISPOSABLE) ×1 IMPLANT
GOWN STRL REUS W/TWL LRG LVL3 (GOWN DISPOSABLE) ×2
GOWN STRL REUS W/TWL XL LVL3 (GOWN DISPOSABLE) ×1
KIT BASIN OR (CUSTOM PROCEDURE TRAY) ×2 IMPLANT
KIT ROOM TURNOVER OR (KITS) ×2 IMPLANT
LIQUID BAND (GAUZE/BANDAGES/DRESSINGS) ×2 IMPLANT
MARKER SKIN DUAL TIP RULER LAB (MISCELLANEOUS) ×2 IMPLANT
MESH VENTRALIGHT ST 6IN CRC (Mesh General) ×2 IMPLANT
NEEDLE SPNL 22GX3.5 QUINCKE BK (NEEDLE) ×2 IMPLANT
NS IRRIG 1000ML POUR BTL (IV SOLUTION) ×2 IMPLANT
PAD ARMBOARD 7.5X6 YLW CONV (MISCELLANEOUS) ×4 IMPLANT
RELOAD RELIATACK 10 (MISCELLANEOUS) IMPLANT
RELOAD RELIATACK 5 (MISCELLANEOUS) ×8 IMPLANT
SCALPEL HARMONIC ACE (MISCELLANEOUS) IMPLANT
SCISSORS LAP 5X35 DISP (ENDOMECHANICALS) IMPLANT
SET IRRIG TUBING LAPAROSCOPIC (IRRIGATION / IRRIGATOR) IMPLANT
SLEEVE ENDOPATH XCEL 5M (ENDOMECHANICALS) IMPLANT
SPONGE GAUZE 2X2 STER 10/PKG (GAUZE/BANDAGES/DRESSINGS) ×1
SUT MNCRL AB 4-0 PS2 18 (SUTURE) ×4 IMPLANT
SUT PROLENE 0 CT 1 CR/8 (SUTURE) ×2 IMPLANT
TOWEL OR 17X24 6PK STRL BLUE (TOWEL DISPOSABLE) ×2 IMPLANT
TOWEL OR 17X26 10 PK STRL BLUE (TOWEL DISPOSABLE) ×2 IMPLANT
TRAY FOLEY CATH 14FRSI W/METER (CATHETERS) IMPLANT
TRAY LAPAROSCOPIC MC (CUSTOM PROCEDURE TRAY) ×2 IMPLANT
TROCAR XCEL BLUNT TIP 100MML (ENDOMECHANICALS) ×2 IMPLANT
TROCAR XCEL NON-BLD 11X100MML (ENDOMECHANICALS) ×2 IMPLANT
TROCAR XCEL NON-BLD 5MMX100MML (ENDOMECHANICALS) ×2 IMPLANT
TUBING INSUFF HIGH FLOW RTP (TUBING) ×2 IMPLANT
WATER STERILE IRR 1000ML POUR (IV SOLUTION) IMPLANT

## 2016-03-28 NOTE — Progress Notes (Signed)
Patient arrived to floor from PACU. Alert and oriented. Surgical site clean, dry, and intact.

## 2016-03-28 NOTE — H&P (Signed)
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 he also notes a bulge when sitting up above this to sternum. no prior abdominal surgery although has a number of back surgeries. He states have to use glidescope to intubate him. He had episode about a month ago of significant pain and enlargement of the hernia this eventually got better and now just causes discomfort. he has no change in bms, no n/v. he would like to consider repair. He is referred by Lysle Rubens PA-C  Other Problems  Back Pain Gastroesophageal Reflux Disease Heart murmur High blood pressure Hypercholesterolemia Inguinal Hernia Melanoma  Past Surgical History Colon Polyp Removal - Colonoscopy Foot Surgery Right. Open Inguinal Hernia Surgery Left. Spinal Surgery - Lower Back Spinal Surgery - Neck Tonsillectomy  Diagnostic Studies History  Colonoscopy within last year  Allergies  No Known Drug Allergies  Medication History Lisinopril (20MG  Tablet, Oral) Active. Methocarbamol (500MG  Tablet, Oral) Active. Vitamin B Complex (Oral) Active. Vitamin C (100MG  Tablet, Oral) Active. Medications Reconciled  Social History  Alcohol use Occasional alcohol use. Caffeine use Carbonated beverages. No drug use Tobacco use Never smoker.  Family History  Breast Cancer Family Members In General. Hypertension Mother. Melanoma Mother. Respiratory Condition Sister.  Review of Systems  General Not Present- Appetite Loss, Chills, Fatigue, Fever, Night Sweats, Weight Gain and Weight Loss. Skin Not Present- Change in Wart/Mole, Dryness, Hives, Jaundice, New Lesions, Non-Healing Wounds, Rash and Ulcer. HEENT Not Present- Earache, Hearing Loss, Hoarseness, Nose Bleed, Oral Ulcers, Ringing in the Ears, Seasonal Allergies, Sinus Pain, Sore Throat, Visual Disturbances, Wears glasses/contact lenses and Yellow Eyes. Respiratory Not Present- Bloody sputum, Chronic Cough,  Difficulty Breathing, Snoring and Wheezing. Breast Not Present- Breast Mass, Breast Pain, Nipple Discharge and Skin Changes. Cardiovascular Present- Swelling of Extremities. Not Present- Chest Pain, Difficulty Breathing Lying Down, Leg Cramps, Palpitations, Rapid Heart Rate and Shortness of Breath. Gastrointestinal Present- Abdominal Pain. Not Present- Bloating, Bloody Stool, Change in Bowel Habits, Chronic diarrhea, Constipation, Difficulty Swallowing, Excessive gas, Gets full quickly at meals, Hemorrhoids, Indigestion, Nausea, Rectal Pain and Vomiting. Male Genitourinary Not Present- Blood in Urine, Change in Urinary Stream, Frequency, Impotence, Nocturia, Painful Urination, Urgency and Urine Leakage. Musculoskeletal Present- Back Pain, Joint Stiffness, Muscle Weakness and Swelling of Extremities. Not Present- Joint Pain and Muscle Pain. Neurological Present- Numbness, Trouble walking and Weakness. Not Present- Decreased Memory, Fainting, Headaches, Seizures, Tingling and Tremor. Psychiatric Not Present- Anxiety, Bipolar, Change in Sleep Pattern, Depression, Fearful and Frequent crying. Endocrine Not Present- Cold Intolerance, Excessive Hunger, Hair Changes, Heat Intolerance, Hot flashes and New Diabetes. Hematology Present- Blood Thinners. Not Present- Easy Bruising, Excessive bleeding, Gland problems, HIV and Persistent Infections.  Vitals Weight: 1193 lb Height: 66in Body Surface Area: 4.27 m Body Mass Index: 192.55 kg/m  Temp.: 37F(Temporal)  Pulse: 78 (Regular)  BP: 130/7 (Sitting, Left Arm, Standard) Physical Exam General Mental Status-Alert. Orientation-Oriented X3. Eye Sclera/Conjunctiva - Bilateral-No scleral icterus. Chest and Lung Exam Chest and lung exam reveals -quiet, even and easy respiratory effort with no use of accessory muscles and on auscultation, normal breath sounds, no adventitious sounds and normal vocal  resonance. Cardiovascular Cardiovascular examination reveals -normal heart sounds, regular rate and rhythm with no murmurs. Abdomen Note: diastasis recti, about 2 cm or so umbo hernia that is midly tender to palpation, reducible but tender  Assessment & Plan UMBILICAL HERNIA ([id number]) Story: Laparoscopic umbilical hernia repair with mesh I discussed pathophysiology of this hernia. He wants to wait  until April but I dont think that is good idea. If he does with his current exam and symptoms I think he certainly is at risk of needing urgent repair and possible obstruction. I discussed a laparoscopic repair with mesh given habitus and diastasis. we discussed surgery, risks and recovery. he will consider options

## 2016-03-28 NOTE — Transfer of Care (Signed)
Immediate Anesthesia Transfer of Care Note  Patient: Anthony Hebert  Procedure(s) Performed: Procedure(s): LAPAROSCOPIC UMBILICAL HERNIA (N/A) INSERTION OF MESH (N/A)  Patient Location: PACU  Anesthesia Type:General  Level of Consciousness: awake, alert  and oriented  Airway & Oxygen Therapy: Patient Spontanous Breathing and Patient connected to face mask oxygen  Post-op Assessment: Report given to RN and Post -op Vital signs reviewed and stable  Post vital signs: Reviewed and stable  Last Vitals:  Vitals:   03/28/16 0735 03/28/16 1042  BP: (!) 119/93 129/78  Pulse: 68 70  Resp: 20 15  Temp: 37.1 C 36.5 C    Last Pain:  Vitals:   03/28/16 0735  TempSrc: Oral         Complications: No apparent anesthesia complications

## 2016-03-28 NOTE — Interval H&P Note (Signed)
History and Physical Interval Note:  03/28/2016 9:05 AM  Anthony Hebert  has presented today for surgery, with the diagnosis of umbilical hernia  The various methods of treatment have been discussed with the patient and family. After consideration of risks, benefits and other options for treatment, the patient has consented to  Procedure(s): LAPAROSCOPIC UMBILICAL HERNIA (N/A) INSERTION OF MESH (N/A) as a surgical intervention .  The patient's history has been reviewed, patient examined, no change in status, stable for surgery.  I have reviewed the patient's chart and labs.  Questions were answered to the patient's satisfaction.     [name]

## 2016-03-28 NOTE — Anesthesia Preprocedure Evaluation (Addendum)
Anesthesia Evaluation  Patient identified by MRN, date of birth, ID band Patient awake    Reviewed: Allergy & Precautions, NPO status , Patient's Chart, lab work & pertinent test results  History of Anesthesia Complications (+) DIFFICULT AIRWAY  Airway Mallampati: III  TM Distance: <3 FB Neck ROM: Full    Dental  (+) Dental Advisory Given, Teeth Intact   Pulmonary neg pulmonary ROS,    breath sounds clear to auscultation       Cardiovascular hypertension, Pt. on medications + CAD  + Valvular Problems/Murmurs  Rhythm:Regular Rate:Normal + Systolic murmurs 0000000 Echo. NL LVF. Aortic valve sclerosis without sclerosis.   Neuro/Psych CVA    GI/Hepatic Neg liver ROS, GERD  ,  Endo/Other  negative endocrine ROS  Renal/GU negative Renal ROS     Musculoskeletal  (+) Arthritis ,   Abdominal   Peds  Hematology negative hematology ROS (+)   Anesthesia Other Findings   Reproductive/Obstetrics                            Anesthesia Physical Anesthesia Plan  ASA: III  Anesthesia Plan: General   Post-op Pain Management:    Induction: Intravenous  Airway Management Planned: Oral ETT and Video Laryngoscope Planned  Additional Equipment:   Intra-op Plan:   Post-operative Plan: Extubation in OR  Informed Consent: I have reviewed the patients History and Physical, chart, labs and discussed the procedure including the risks, benefits and alternatives for the proposed anesthesia with the patient or authorized representative who has indicated his/her understanding and acceptance.   Dental advisory given  Plan Discussed with: CRNA  Anesthesia Plan Comments:         Anesthesia Quick Evaluation

## 2016-03-28 NOTE — Op Note (Signed)
Preoperative diagnosis: Umbilical hernia with diastasis recit Postoperative diagnosis: Same as above Surgery: Laparoscopic umbilical hernia repair with 15 cm ventralight mesh Surgeon: Dr. Serita Grammes Anesthesia: Gen. Estimated blood loss: Minimal Drains: None Specimens: None Occasions: None Sponge needle count was correct at completion Disposition to recovery stable  Indications: This is a 77 yom with symptomatic uh and diastasis recti. We discussed options and elected to perform a lap procedure with mesh.  Procedure: After informed consent was obtained the patient was taken in the operating room. He was given antibiotics and had sequential compression devices in place. He was placed under general anesthesia without complication. His abdomen was prepped and draped in the standard sterile surgical fashion. A surgical timeout was then performed. Charlie Pitter was placed over the abdomen.  I then infiltrated Marcaine in the luq. I then inserted a 5 mm Optiview trocar in the left upper quadrant without difficulty. There was no evidence of any entry injury. I then insufflated the abdomen to 15 mmHg pressure. I then inserted 2 further 5 mm trocars in the left side of the abdomen under direct vision without complication. He was noted to have a very small about 1 cm defect with some omentum adherent to it. This was then taken down.He had a diastasis above this small hernia.  I placed 4 sutures of 0 Prolene around the cardinal positions of the mesh. I then inserted this through the 5 mm trocar site on the left side of the abdomen. I then laid this flat. I measured this out and then brought the sutures up to give plenty of overlap to the defect. These sutures were tied down. I then used a Reliatack to secure the remainder of the mesh. This was all in good position. Hemostasis was observed. I then removed the trocars and desufflated the abdomen. I closed the incisions with 4-0 Monocryl. Glue was placed over  these. He tolerated this well was extubated and transferred to recovery stable.

## 2016-03-28 NOTE — Anesthesia Procedure Notes (Signed)
Procedure Name: Intubation Date/Time: 03/28/2016 9:40 AM Performed by: Huey Romans ANN Pre-anesthesia Checklist: Patient identified, Emergency Drugs available, Suction available and Patient being monitored Patient Re-evaluated:Patient Re-evaluated prior to inductionOxygen Delivery Method: Circle System Utilized Preoxygenation: Pre-oxygenation with 100% oxygen Intubation Type: IV induction Ventilation: Mask ventilation without difficulty Laryngoscope Size: Glidescope Tube type: Oral Number of attempts: 1 Airway Equipment and Method: Rigid stylet and Video-laryngoscopy Placement Confirmation: ETT inserted through vocal cords under direct vision,  positive ETCO2 and breath sounds checked- equal and bilateral Secured at: 23 cm Tube secured with: Tape Dental Injury: Teeth and Oropharynx as per pre-operative assessment  Difficulty Due To: Difficulty was anticipated and Difficult Airway- due to limited oral opening Future Recommendations: Recommend- induction with short-acting agent, and alternative techniques readily available Comments: Limited space in oropharnyx, use green glide handle and stylet.  Small glottic opening.  Full beard but able to ventilate easily.

## 2016-03-28 NOTE — Discharge Instructions (Signed)
[facility] LAPAROSCOPIC SURGERY: POST OP INSTRUCTIONS  Always review your discharge instruction sheet given to you by the facility where your surgery was performed. IF YOU HAVE DISABILITY OR FAMILY LEAVE FORMS, YOU MUST BRING THEM TO THE OFFICE FOR PROCESSING.   DO NOT GIVE THEM TO YOUR DOCTOR.  1. A prescription for pain medication may be given to you upon discharge.  Take your pain medication as prescribed, if needed.  If narcotic pain medicine is not needed, then you may take acetaminophen (Tylenol), naprosyn (Alleve), or ibuprofen (Advil) as needed. 2. Take your usually prescribed medications unless otherwise directed. 3. If you need a refill on your pain medication, please contact your pharmacy.  They will contact our office to request authorization. Prescriptions will not be filled after 5pm or on week-ends. 4. You should follow a light diet the first few days after arrival home, such as soup and crackers, etc.  Be sure to include lots of fluids daily. 5. Most patients will experience some swelling and bruising in the area of the incisions.  Ice packs will help.  Swelling and bruising can take several days to resolve.  6. It is common to experience some constipation if taking pain medication after surgery.  Increasing fluid intake and taking a stool softener (such as Colace) will usually help or prevent this problem from occurring.  A mild laxative (Milk of Magnesia or Miralax) should be taken according to package instructions if there are no bowel movements after 48 hours. 7. Unless discharge instructions indicate otherwise, you may remove your bandages 48 hours after surgery, and you may shower at that time.  You may have steri-strips (small skin tapes) in place directly over the incision.  These strips should be left on the skin for 7-10 days.  If your surgeon used skin glue on the incision, you may shower in 24 hours.  The glue will flake  off over the next 2-3 weeks.  Any sutures or staples will be removed at the office during your follow-up visit. 8. ACTIVITIES:  You may resume regular (light) daily activities beginning the next day--such as daily self-care, walking, climbing stairs--gradually increasing activities as tolerated.  You may have sexual intercourse when it is comfortable.  Refrain from any heavy lifting or straining until approved by your doctor. a. You may drive when you are no longer taking prescription pain medication, you can comfortably wear a seatbelt, and you can safely maneuver your car and apply brakes. b. RETURN TO WORK:  __________________________________________________________ 9. You should see your doctor in the office for a follow-up appointment approximately 2-3 weeks after your surgery.  Make sure that you call for this appointment within a day or two after you arrive home to insure a convenient appointment time. 10. OTHER INSTRUCTIONS: __________________________________________________________________________________________________________________________ __________________________________________________________________________________________________________________________ WHEN TO CALL YOUR DOCTOR: 1. Fever over 101.0 2. Inability to urinate 3. Continued bleeding from incision. 4. Increased pain, redness, or drainage from the incision. 5. Increasing abdominal pain  The clinic staff is available to answer your questions during regular business hours.  Please don't hesitate to call and ask to speak to one of the nurses for clinical concerns.  If you have a medical emergency, go to the nearest emergency room or call 911.  A surgeon from [facility] is always on call at the hospital. [address], [address], [place], [place]  [address] ? [address] [address], [place], [place]   [address] [phone] ? [phone] ? [phone] Web site: [email or web address]  

## 2016-03-29 ENCOUNTER — Encounter (HOSPITAL_COMMUNITY): Payer: Self-pay | Admitting: General Surgery

## 2016-03-29 DIAGNOSIS — K429 Umbilical hernia without obstruction or gangrene: Secondary | ICD-10-CM | POA: Diagnosis not present

## 2016-03-29 MED ORDER — OXYCODONE HCL 5 MG PO TABS: 5.0000 mg | ORAL_TABLET | ORAL | 0 refills | Status: DC | PRN

## 2016-03-29 NOTE — Anesthesia Postprocedure Evaluation (Signed)
Anesthesia Post Note  Patient: Anthony Hebert  Procedure(s) Performed: Procedure(s) (LRB): LAPAROSCOPIC UMBILICAL HERNIA (N/A) INSERTION OF MESH (N/A)  Patient location during evaluation: PACU Anesthesia Type: General Level of consciousness: awake and alert and patient cooperative Pain management: pain level controlled Vital Signs Assessment: post-procedure vital signs reviewed and stable Respiratory status: spontaneous breathing and respiratory function stable Cardiovascular status: stable Anesthetic complications: no    Last Vitals:  Vitals:   03/29/16 0227 03/29/16 0633  BP: (!) 99/54 (!) 104/56  Pulse: 66 63  Resp: 16 17  Temp: 37.1 C 36.9 C    Last Pain:  Vitals:   03/29/16 0738  TempSrc:   PainSc: 0-No pain                 Jeanmarc Viernes S

## 2016-03-29 NOTE — Discharge Summary (Signed)
Physician Discharge Summary  Patient ID: Anthony Hebert MRN: [id number] DOB/AGE: 05/13/1951 65 y.o.  Admit date: 03/28/2016 Discharge date: 03/29/2016  Admission Diagnoses: Ventral hernia  Discharge Diagnoses:  Active Problems:   Ventral hernia   Discharged Condition: good  Hospital Course: 5 yom who has umbilical hernia and diastasis recti and underwent laparoscopic ventral hernia repair with mesh.  This has been uneventful recovery and he will be discharged home  Consults: None  Significant Diagnostic Studies: none  Treatments: surgery: lap vh with mesh  Discharge Exam: Blood pressure (!) 104/56, pulse 63, temperature 98.4 F (36.9 C), resp. rate 17, weight 86.6 kg (191 lb), SpO2 93 %. GI: soft approp tender incisions clean  Disposition: 01-Home or Self Care     Medication List    TAKE these medications   COLCRYS 0.6 MG tablet Generic drug:  colchicine Take 1 tablet by mouth 3 (three) times daily as needed (gout).   famotidine 10 MG tablet Commonly known as:  PEPCID Take 10 mg by mouth 2 (two) times daily.   lisinopril 20 MG tablet Commonly known as:  PRINIVIL,ZESTRIL Take 20 mg by mouth daily.   oxyCODONE 5 MG immediate release tablet Commonly known as:  Oxy IR/ROXICODONE Take 1-2 tablets (5-10 mg total) by mouth every 4 (four) hours as needed for moderate pain.   simvastatin 40 MG tablet Commonly known as:  ZOCOR Take 40 mg by mouth every evening.   VITAMIN B6 PO Take 1 tablet by mouth daily.   VITAMIN C PO Take 1 tablet by mouth daily.      Follow-up [place], MD.   Specialty:  General Surgery Contact information: [address] [place] [address] [phone]           Signed: Rolm Bookbinder 03/29/2016, 8:38 AM

## 2016-03-29 NOTE — Accreditation Note (Signed)
Tolerated regular diet. Discharged home accompanied by friend.

## 2016-12-06 DIAGNOSIS — G5603 Carpal tunnel syndrome, bilateral upper limbs: Secondary | ICD-10-CM | POA: Diagnosis not present

## 2016-12-06 DIAGNOSIS — G5601 Carpal tunnel syndrome, right upper limb: Secondary | ICD-10-CM | POA: Diagnosis not present

## 2016-12-06 DIAGNOSIS — G5602 Carpal tunnel syndrome, left upper limb: Secondary | ICD-10-CM | POA: Diagnosis not present

## 2016-12-06 DIAGNOSIS — M25532 Pain in left wrist: Secondary | ICD-10-CM | POA: Diagnosis not present

## 2016-12-12 DIAGNOSIS — R200 Anesthesia of skin: Secondary | ICD-10-CM | POA: Diagnosis not present

## 2016-12-12 DIAGNOSIS — W57XXXA Bitten or stung by nonvenomous insect and other nonvenomous arthropods, initial encounter: Secondary | ICD-10-CM | POA: Diagnosis not present

## 2016-12-12 DIAGNOSIS — M109 Gout, unspecified: Secondary | ICD-10-CM | POA: Diagnosis not present

## 2016-12-19 DIAGNOSIS — L821 Other seborrheic keratosis: Secondary | ICD-10-CM | POA: Diagnosis not present

## 2016-12-19 DIAGNOSIS — Z85828 Personal history of other malignant neoplasm of skin: Secondary | ICD-10-CM | POA: Diagnosis not present

## 2016-12-19 DIAGNOSIS — L570 Actinic keratosis: Secondary | ICD-10-CM | POA: Diagnosis not present

## 2016-12-19 DIAGNOSIS — Z872 Personal history of diseases of the skin and subcutaneous tissue: Secondary | ICD-10-CM | POA: Diagnosis not present

## 2016-12-19 DIAGNOSIS — D225 Melanocytic nevi of trunk: Secondary | ICD-10-CM | POA: Diagnosis not present

## 2016-12-19 DIAGNOSIS — D1801 Hemangioma of skin and subcutaneous tissue: Secondary | ICD-10-CM | POA: Diagnosis not present

## 2016-12-19 DIAGNOSIS — L814 Other melanin hyperpigmentation: Secondary | ICD-10-CM | POA: Diagnosis not present

## 2016-12-19 DIAGNOSIS — D485 Neoplasm of uncertain behavior of skin: Secondary | ICD-10-CM | POA: Diagnosis not present

## 2016-12-20 DIAGNOSIS — Z0100 Encounter for examination of eyes and vision without abnormal findings: Secondary | ICD-10-CM | POA: Diagnosis not present

## 2016-12-20 DIAGNOSIS — I10 Essential (primary) hypertension: Secondary | ICD-10-CM | POA: Diagnosis not present

## 2017-01-16 ENCOUNTER — Ambulatory Visit: Payer: Self-pay | Admitting: Orthopedic Surgery

## 2017-01-24 DIAGNOSIS — Z1211 Encounter for screening for malignant neoplasm of colon: Secondary | ICD-10-CM | POA: Diagnosis not present

## 2017-01-24 DIAGNOSIS — M109 Gout, unspecified: Secondary | ICD-10-CM | POA: Diagnosis not present

## 2017-01-24 DIAGNOSIS — Z125 Encounter for screening for malignant neoplasm of prostate: Secondary | ICD-10-CM | POA: Diagnosis not present

## 2017-01-24 DIAGNOSIS — E785 Hyperlipidemia, unspecified: Secondary | ICD-10-CM | POA: Diagnosis not present

## 2017-01-24 DIAGNOSIS — Z0000 Encounter for general adult medical examination without abnormal findings: Secondary | ICD-10-CM | POA: Diagnosis not present

## 2017-01-24 DIAGNOSIS — I10 Essential (primary) hypertension: Secondary | ICD-10-CM | POA: Diagnosis not present

## 2017-02-15 DIAGNOSIS — G5602 Carpal tunnel syndrome, left upper limb: Secondary | ICD-10-CM | POA: Diagnosis not present

## 2017-02-15 DIAGNOSIS — M659 Synovitis and tenosynovitis, unspecified: Secondary | ICD-10-CM | POA: Diagnosis not present

## 2017-03-16 NOTE — Pre-Procedure Instructions (Signed)
Anthony Hebert  03/16/2017      [facility] [address], [place] - [place] [address] Phone: [phone] Fax: [phone]    Your procedure is scheduled on March 22, 2017.  Report to [facility] Admitting at 1100 AM.  Call this number if you have problems the morning of surgery:  [phone]   Remember:  Do not eat food or drink liquids after midnight.  Take these medicines the morning of surgery with A SIP OF WATER famotidine (pepcid), hydrocodone-acetaminophen (norco) .  7 days prior to surgery STOP taking any nabumetone (relafen), Aspirin, Aleve, Naproxen, Ibuprofen, Motrin, Advil, Goody's, BC's, all herbal medications, fish oil, and all vitamins   Do not wear jewelry, make-up or nail polish.  Do not wear lotions, powders, or perfumes, or deoderant.   Men may shave face and neck.  Do not bring valuables to the hospital.  [facility] is not responsible for any belongings or valuables.  Contacts, dentures or bridgework may not be worn into surgery.  Leave your suitcase in the car.  After surgery it may be brought to your room.  For patients admitted to the hospital, discharge time will be determined by your treatment team.  Patients discharged the day of surgery will not be allowed to drive home.    Special instructions:   [facility]- Preparing For Surgery  Before surgery, you can play an important role. Because skin is not sterile, your skin needs to be as free of germs as possible. You can reduce the number of germs on your skin by washing with CHG (chlorahexidine gluconate) Soap before surgery.  CHG is an antiseptic cleaner which kills germs and bonds with the skin to continue killing germs even after washing.  Please do not use if you have an allergy to CHG or antibacterial soaps. If your skin becomes reddened/irritated stop using the CHG.  Do not shave (including legs and underarms)  for at least 48 hours prior to first CHG shower. It is OK to shave your face.  Please follow these instructions carefully.   1. Shower the NIGHT BEFORE SURGERY and the MORNING OF SURGERY with CHG.   2. If you chose to wash your hair, wash your hair first as usual with your normal shampoo.  3. After you shampoo, rinse your hair and body thoroughly to remove the shampoo.  4. Use CHG as you would any other liquid soap. You can apply CHG directly to the skin and wash gently with a scrungie or a clean washcloth.   5. Apply the CHG Soap to your body ONLY FROM THE NECK DOWN.  Do not use on open wounds or open sores. Avoid contact with your eyes, ears, mouth and genitals (private parts). Wash genitals (private parts) with your normal soap.  6. Wash thoroughly, paying special attention to the area where your surgery will be performed.  7. Thoroughly rinse your body with warm water from the neck down.  8. DO NOT shower/wash with your normal soap after using and rinsing off the CHG Soap.  9. Pat yourself dry with a CLEAN TOWEL.   10. Wear CLEAN PAJAMAS   11. Place CLEAN SHEETS on your bed the night of your first shower and DO NOT SLEEP WITH PETS.    Day of Surgery: Do not apply any deodorants/lotions. Please wear clean clothes to the hospital/surgery center.     Please read over the  following fact sheets that you were given. Pain Booklet, Coughing and Deep Breathing and Surgical Site Infection Prevention

## 2017-03-19 ENCOUNTER — Encounter (HOSPITAL_COMMUNITY)
Admission: RE | Admit: 2017-03-19 | Discharge: 2017-03-19 | Disposition: A | Discharge status: Home / Self Care | Payer: Medicare HMO | Source: Ambulatory Visit | Attending: Orthopedic Surgery | Admitting: Orthopedic Surgery

## 2017-03-19 ENCOUNTER — Encounter (HOSPITAL_COMMUNITY): Payer: Self-pay

## 2017-03-19 DIAGNOSIS — Z85820 Personal history of malignant melanoma of skin: Secondary | ICD-10-CM | POA: Diagnosis not present

## 2017-03-19 DIAGNOSIS — G5602 Carpal tunnel syndrome, left upper limb: Secondary | ICD-10-CM | POA: Insufficient documentation

## 2017-03-19 DIAGNOSIS — M549 Dorsalgia, unspecified: Secondary | ICD-10-CM | POA: Diagnosis not present

## 2017-03-19 DIAGNOSIS — E785 Hyperlipidemia, unspecified: Secondary | ICD-10-CM | POA: Diagnosis not present

## 2017-03-19 DIAGNOSIS — G5603 Carpal tunnel syndrome, bilateral upper limbs: Secondary | ICD-10-CM | POA: Diagnosis not present

## 2017-03-19 DIAGNOSIS — Z79899 Other long term (current) drug therapy: Secondary | ICD-10-CM | POA: Diagnosis not present

## 2017-03-19 DIAGNOSIS — Z8673 Personal history of transient ischemic attack (TIA), and cerebral infarction without residual deficits: Secondary | ICD-10-CM | POA: Diagnosis not present

## 2017-03-19 DIAGNOSIS — M109 Gout, unspecified: Secondary | ICD-10-CM | POA: Diagnosis not present

## 2017-03-19 DIAGNOSIS — I10 Essential (primary) hypertension: Secondary | ICD-10-CM | POA: Diagnosis not present

## 2017-03-19 DIAGNOSIS — G8929 Other chronic pain: Secondary | ICD-10-CM | POA: Diagnosis not present

## 2017-03-19 DIAGNOSIS — K219 Gastro-esophageal reflux disease without esophagitis: Secondary | ICD-10-CM | POA: Diagnosis not present

## 2017-03-19 LAB — BASIC METABOLIC PANEL
Anion gap: 8 (ref 5–15)
BUN: 16 mg/dL (ref 6–20)
CALCIUM: 9.4 mg/dL (ref 8.9–10.3)
CHLORIDE: 105 mmol/L (ref 101–111)
CO2: 29 mmol/L (ref 22–32)
CREATININE: 1.13 mg/dL (ref 0.61–1.24)
GFR calc Af Amer: >60 mL/min (ref >60)
GFR calc non Af Amer: >60 mL/min (ref >60)
Glucose, Bld: 135 mg/dL — ABNORMAL HIGH (ref 65–99)
Potassium: 4.3 mmol/L (ref 3.5–5.1)
Sodium: 142 mmol/L (ref 135–145)

## 2017-03-19 LAB — CBC
HEMATOCRIT: 40.7 % (ref 39.0–52.0)
HEMOGLOBIN: 14.1 g/dL (ref 13.0–17.0)
MCH: 31.1 pg (ref 26.0–34.0)
MCHC: 34.6 g/dL (ref 30.0–36.0)
MCV: 89.8 fL (ref 78.0–100.0)
Platelets: 149 10*3/uL — ABNORMAL LOW (ref 150–400)
RBC: 4.53 MIL/uL (ref 4.22–5.81)
RDW: 12.9 % (ref 11.5–15.5)
WBC: 6.3 10*3/uL (ref 4.0–10.5)

## 2017-03-19 NOTE — Progress Notes (Signed)
   03/19/17 0948  OBSTRUCTIVE SLEEP APNEA  Have you ever been diagnosed with sleep apnea through a sleep study? No  Do you snore loudly (loud enough to be heard through closed doors)?  1  Do you often feel tired, fatigued, or sleepy during the daytime (such as falling asleep during driving or talking to someone)? 0  Has anyone observed you stop breathing during your sleep? 0  Do you have, or are you being treated for high blood pressure? 1  BMI more than 35 kg/m2? 0  Age > 50 (1-yes) 1  Neck circumference greater than:Male 16 inches or larger, Male 17inches or larger? 1  Male Gender (Yes=1) 1  Obstructive Sleep Apnea Score 5  Score 5 or greater  Results sent to PCP

## 2017-03-19 NOTE — Progress Notes (Signed)
PCP - [facility] -  Patient states he was released last year and is no longer followed by a cardiologist  Chest x-ray - n/a EKG - patient states one was done in March/April at [facility]; tracing requested Stress Test - patient denies ECHO - 08/02/2012 Cardiac Cath - patient denies  Sleep Study - patient denies Scored 5 on Stop [place] Lighter, sent to PCP   Patient denies shortness of breath, fever, cough and chest pain at PAT appointment   Patient verbalized understanding of instructions that were given to them at the PAT appointment. Patient was also instructed that they will need to review over the PAT instructions again at home before surgery.

## 2017-03-20 ENCOUNTER — Encounter (HOSPITAL_COMMUNITY): Payer: Self-pay

## 2017-03-20 NOTE — Progress Notes (Addendum)
Anesthesia Chart Review: Patient is a 66 year old male scheduled for left open carpal tunnel release on 03/22/17.  History includes DIFFICULT AIRWAY, non-smoker, HTN, GERD, melanoma (left eye), heart murmur with aortic valve sclerosis and trivial TR by 07/2012 echo, removal of spinal cord stimulator '14, posterior C7-T1 foraminotomy 10/07/13, right ulnar nerve release/Guyon's canal release/CTR/flexor digitorum profundus tendon transfer/median nerve neurolysis/Cahmitz tendon transfer 07/15/15 ([place] with LMA), umbilical hernia repair 2/42/68. OSA screening score was elevated at 5.   I saw him back on 09/17/12 for a pre-operative evaluation due to difficult airway history. He reported possible CVA after back surgery in the '90's, but denied known CAD. He also denied prior heart tests until his echo in 2013 that showed no regional wall motion abnormalities. Dr. Irish Lack last saw him on 09/24/14 with patient instructions indicating "follow-up appointment as needed with Dr. Irish Lack." Dr. Irish Lack cleared patient for surgery at that time, although the type of surgery was not specified.   Anesthesia Summary: - Per my 09/17/12 note and subsequent anesthesia records: He was seen by Anesthesiologist Dr. Linna Caprice prior to his surgery in August 2012. At that time patient reported a previous awake nasal intubation, but later had a successful intubation with glidescope.  - 03/21/11: He underwent right L1-2 fusion. He had post-operative weakness and was taken back to the OR that same day for wound exploration. He was intubated for both of these procedures using a glidescope and rigid stylet. His airway was noted to be very anterior with small opening. Following his second surgery, he remained intubated due to low respiratory rate and periods of apnea, negative cuff leak. He was seen by Critical Care and ultimately extubated on POD #1. According to [facility] notes, there was question if undiagnosed OSA contributed to his  post-operative respiratory failure as he had no known history of pulmonary disease. He had not had a sleep study.  - 09/19/12: MAC 4 with Bougie stylet used to place 25m ETT.  - 10/07/13: IV induction for GETA using rigid stylet with video laryngoscopy was used. Recommend- induction with short-acting agent, and alternative techniques readily available.  - 03/28/16: IV induction, mask ventilation without difficulty. Rigid stylet and Glidescope used to palece oral ETT. Difficulty anticipated due to limited oral opening. Comments: Limited space in oropharnyx, use green glide handle and stylet.  Small glottic opening.  Full beard but able to ventilate easily.  PCP is MLysle Rubens PA-C with [facility] Last visit 01/24/17.   Meds include Pepcid, Norco, lisinopril-HCTZ, Relafen, Zocor, Turmeric.   BP 134/69   Pulse 87   Temp 36.9 C   Resp 20   Ht _0  (1.676 m)   Wt 201 lb (91.2 kg)   SpO2 96%   BMI 32.44 kg/m   [facility] SB at 59 bpm. Intermittently he has had EKGs dating back to at least 03/21/11 that have shown possible inferior infarct (Q waves in lead III, +/- aVF which is lower in voltage). Overall, I think his EKGs have remained stable since at least 03/21/11.   Echo 08/02/12: LVEF 60-65%, aortic valve sclerosis but opens well. Trivial TR.  Preoperative labs noted.  Patient was discharged from cardiology in 2016. EKG appears stable. He does have a history of difficult intubation but successful intubation with glidescope in the recent past. Multiple anesthesia records available for review in Epic. If no acute changes then I anticipate that he can proceed as planned. Definitive anesthesia plan following evaluation on the day  of surgery.  George Hugh [facility] Short Stay Center/Anesthesiology Phone [phone] 03/20/2017 12:54 PM

## 2017-03-22 ENCOUNTER — Encounter (HOSPITAL_COMMUNITY): Payer: Self-pay | Admitting: *Deleted

## 2017-03-22 ENCOUNTER — Ambulatory Visit (HOSPITAL_COMMUNITY): Payer: Medicare HMO | Admitting: Emergency Medicine

## 2017-03-22 ENCOUNTER — Ambulatory Visit (HOSPITAL_COMMUNITY)
Admission: RE | Admit: 2017-03-22 | Discharge: 2017-03-22 | Disposition: A | Discharge status: Home / Self Care | Payer: Medicare HMO | Source: Ambulatory Visit | Attending: Orthopedic Surgery | Admitting: Orthopedic Surgery

## 2017-03-22 ENCOUNTER — Ambulatory Visit (HOSPITAL_COMMUNITY): Payer: Medicare HMO | Admitting: Anesthesiology

## 2017-03-22 ENCOUNTER — Encounter (HOSPITAL_COMMUNITY)
Admission: RE | Disposition: A | Discharge status: Home / Self Care | Payer: Self-pay | Source: Ambulatory Visit | Attending: Orthopedic Surgery

## 2017-03-22 DIAGNOSIS — K219 Gastro-esophageal reflux disease without esophagitis: Secondary | ICD-10-CM | POA: Diagnosis not present

## 2017-03-22 DIAGNOSIS — Z8673 Personal history of transient ischemic attack (TIA), and cerebral infarction without residual deficits: Secondary | ICD-10-CM | POA: Insufficient documentation

## 2017-03-22 DIAGNOSIS — Z85820 Personal history of malignant melanoma of skin: Secondary | ICD-10-CM | POA: Insufficient documentation

## 2017-03-22 DIAGNOSIS — G8929 Other chronic pain: Secondary | ICD-10-CM | POA: Diagnosis not present

## 2017-03-22 DIAGNOSIS — I10 Essential (primary) hypertension: Secondary | ICD-10-CM | POA: Diagnosis not present

## 2017-03-22 DIAGNOSIS — G5603 Carpal tunnel syndrome, bilateral upper limbs: Secondary | ICD-10-CM | POA: Diagnosis not present

## 2017-03-22 DIAGNOSIS — E785 Hyperlipidemia, unspecified: Secondary | ICD-10-CM | POA: Diagnosis not present

## 2017-03-22 DIAGNOSIS — M109 Gout, unspecified: Secondary | ICD-10-CM | POA: Diagnosis not present

## 2017-03-22 DIAGNOSIS — M549 Dorsalgia, unspecified: Secondary | ICD-10-CM | POA: Insufficient documentation

## 2017-03-22 DIAGNOSIS — C801 Malignant (primary) neoplasm, unspecified: Secondary | ICD-10-CM | POA: Diagnosis not present

## 2017-03-22 DIAGNOSIS — Z79899 Other long term (current) drug therapy: Secondary | ICD-10-CM | POA: Insufficient documentation

## 2017-03-22 DIAGNOSIS — E782 Mixed hyperlipidemia: Secondary | ICD-10-CM | POA: Diagnosis not present

## 2017-03-22 DIAGNOSIS — G5602 Carpal tunnel syndrome, left upper limb: Secondary | ICD-10-CM | POA: Diagnosis not present

## 2017-03-22 HISTORY — PX: CARPAL TUNNEL RELEASE: SHX101

## 2017-03-22 SURGERY — CARPAL TUNNEL RELEASE
Anesthesia: General | Site: Wrist | Laterality: Left

## 2017-03-22 MED ORDER — LIDOCAINE HCL (CARDIAC) 20 MG/ML IV SOLN
INTRAVENOUS | Status: DC | PRN
  Administered 2017-03-22: 100 mg via INTRAVENOUS

## 2017-03-22 MED ORDER — OXYCODONE HCL 5 MG/5ML PO SOLN: 5.0000 mg | Freq: Once | ORAL | Status: AC | PRN

## 2017-03-22 MED ORDER — PHENYLEPHRINE 40 MCG/ML (10ML) SYRINGE FOR IV PUSH (FOR BLOOD PRESSURE SUPPORT)
PREFILLED_SYRINGE | INTRAVENOUS | Status: AC
  Filled 2017-03-22: qty 10

## 2017-03-22 MED ORDER — LACTATED RINGERS IV SOLN
INTRAVENOUS | Status: DC
  Administered 2017-03-22 (×2): via INTRAVENOUS

## 2017-03-22 MED ORDER — FENTANYL CITRATE (PF) 250 MCG/5ML IJ SOLN
INTRAMUSCULAR | Status: AC
  Filled 2017-03-22: qty 5

## 2017-03-22 MED ORDER — OXYCODONE HCL 5 MG PO TABS
ORAL_TABLET | ORAL | Status: AC
  Filled 2017-03-22: qty 1

## 2017-03-22 MED ORDER — ONDANSETRON HCL 4 MG/2ML IJ SOLN
INTRAMUSCULAR | Status: AC
  Filled 2017-03-22: qty 2

## 2017-03-22 MED ORDER — MIDAZOLAM HCL 2 MG/2ML IJ SOLN
INTRAMUSCULAR | Status: DC | PRN
  Administered 2017-03-22: 2 mg via INTRAVENOUS

## 2017-03-22 MED ORDER — 0.9 % SODIUM CHLORIDE (POUR BTL) OPTIME
TOPICAL | Status: DC | PRN
  Administered 2017-03-22: 1000 mL

## 2017-03-22 MED ORDER — PROPOFOL 10 MG/ML IV BOLUS
INTRAVENOUS | Status: AC
  Filled 2017-03-22: qty 20

## 2017-03-22 MED ORDER — FENTANYL CITRATE (PF) 100 MCG/2ML IJ SOLN
INTRAMUSCULAR | Status: DC | PRN
  Administered 2017-03-22: 150 ug via INTRAVENOUS

## 2017-03-22 MED ORDER — BUPIVACAINE HCL (PF) 0.25 % IJ SOLN
INTRAMUSCULAR | Status: DC | PRN
  Administered 2017-03-22: 10 mL

## 2017-03-22 MED ORDER — ONDANSETRON HCL 4 MG/2ML IJ SOLN
INTRAMUSCULAR | Status: DC | PRN
  Administered 2017-03-22: 4 mg via INTRAVENOUS

## 2017-03-22 MED ORDER — CEFAZOLIN SODIUM-DEXTROSE 2-4 GM/100ML-% IV SOLN
2.0000 g | INTRAVENOUS | Status: AC
  Administered 2017-03-22: 2 g via INTRAVENOUS
  Filled 2017-03-22: qty 100

## 2017-03-22 MED ORDER — MEPERIDINE HCL 25 MG/ML IJ SOLN: 6.2500 mg | INTRAMUSCULAR | Status: DC | PRN

## 2017-03-22 MED ORDER — OXYCODONE HCL 5 MG PO TABS
5.0000 mg | ORAL_TABLET | Freq: Once | ORAL | Status: AC | PRN
  Administered 2017-03-22: 5 mg via ORAL

## 2017-03-22 MED ORDER — MIDAZOLAM HCL 2 MG/2ML IJ SOLN
INTRAMUSCULAR | Status: AC
  Filled 2017-03-22: qty 2

## 2017-03-22 MED ORDER — CHLORHEXIDINE GLUCONATE 4 % EX LIQD: 60.0000 mL | Freq: Once | CUTANEOUS | Status: DC

## 2017-03-22 MED ORDER — HYDROMORPHONE HCL 1 MG/ML IJ SOLN: 0.2500 mg | INTRAMUSCULAR | Status: DC | PRN

## 2017-03-22 MED ORDER — PROPOFOL 10 MG/ML IV BOLUS
INTRAVENOUS | Status: DC | PRN
  Administered 2017-03-22: 150 mg via INTRAVENOUS

## 2017-03-22 MED ORDER — PROMETHAZINE HCL 25 MG/ML IJ SOLN: 6.2500 mg | INTRAMUSCULAR | Status: DC | PRN

## 2017-03-22 MED ORDER — DEXAMETHASONE SODIUM PHOSPHATE 10 MG/ML IJ SOLN
INTRAMUSCULAR | Status: DC | PRN
  Administered 2017-03-22: 10 mg via INTRAVENOUS

## 2017-03-22 MED ORDER — OXYCODONE-ACETAMINOPHEN 5-325 MG PO TABS: 1.0000 | ORAL_TABLET | Freq: Four times a day (QID) | ORAL | 0 refills | Status: AC | PRN

## 2017-03-22 SURGICAL SUPPLY — 47 items
BANDAGE ACE 3X5.8 VEL STRL LF (GAUZE/BANDAGES/DRESSINGS) ×3 IMPLANT
BANDAGE ACE 4X5 VEL STRL LF (GAUZE/BANDAGES/DRESSINGS) ×3 IMPLANT
BNDG GAUZE ELAST 4 BULKY (GAUZE/BANDAGES/DRESSINGS) ×3 IMPLANT
CORDS BIPOLAR (ELECTRODE) ×3 IMPLANT
COVER SURGICAL LIGHT HANDLE (MISCELLANEOUS) ×3 IMPLANT
CUFF TOURNIQUET SINGLE 18IN (TOURNIQUET CUFF) ×3 IMPLANT
CUFF TOURNIQUET SINGLE 24IN (TOURNIQUET CUFF) IMPLANT
DRAPE SURG 17X23 STRL (DRAPES) ×3 IMPLANT
DRSG EMULSION OIL 3X3 NADH (GAUZE/BANDAGES/DRESSINGS) ×6 IMPLANT
EVACUATOR 1/8 PVC DRAIN (DRAIN) IMPLANT
GAUZE SPONGE 4X4 12PLY STRL (GAUZE/BANDAGES/DRESSINGS) ×3 IMPLANT
GAUZE SPONGE 4X4 12PLY STRL LF (GAUZE/BANDAGES/DRESSINGS) ×3 IMPLANT
GAUZE XEROFORM 1X8 LF (GAUZE/BANDAGES/DRESSINGS) ×3 IMPLANT
GLOVE BIOGEL M 8.0 STRL (GLOVE) ×3 IMPLANT
GLOVE BIOGEL PI IND STRL 7.5 (GLOVE) ×1 IMPLANT
GLOVE BIOGEL PI INDICATOR 7.5 (GLOVE) ×2
GLOVE SS BIOGEL STRL SZ 8 (GLOVE) ×2 IMPLANT
GLOVE SUPERSENSE BIOGEL SZ 8 (GLOVE) ×4
GOWN STRL REUS W/ TWL LRG LVL3 (GOWN DISPOSABLE) ×2 IMPLANT
GOWN STRL REUS W/ TWL XL LVL3 (GOWN DISPOSABLE) ×3 IMPLANT
GOWN STRL REUS W/TWL LRG LVL3 (GOWN DISPOSABLE) ×4
GOWN STRL REUS W/TWL XL LVL3 (GOWN DISPOSABLE) ×6
KIT BASIN OR (CUSTOM PROCEDURE TRAY) ×3 IMPLANT
KIT ROOM TURNOVER OR (KITS) ×3 IMPLANT
LOOP VESSEL MAXI BLUE (MISCELLANEOUS) IMPLANT
NEEDLE HYPO 25GX1X1/2 BEV (NEEDLE) IMPLANT
NS IRRIG 1000ML POUR BTL (IV SOLUTION) ×3 IMPLANT
PACK ORTHO EXTREMITY (CUSTOM PROCEDURE TRAY) ×3 IMPLANT
PAD ARMBOARD 7.5X6 YLW CONV (MISCELLANEOUS) ×6 IMPLANT
PAD CAST 4YDX4 CTTN HI CHSV (CAST SUPPLIES) ×1 IMPLANT
PADDING CAST COTTON 4X4 STRL (CAST SUPPLIES) ×2
SCRUB BETADINE 4OZ XXX (MISCELLANEOUS) ×3 IMPLANT
SOL PREP POV-IOD 4OZ 10% (MISCELLANEOUS) ×6 IMPLANT
SPLINT FIBERGLASS 3X12 (CAST SUPPLIES) ×3 IMPLANT
SUT PROLENE 4 0 PS 2 18 (SUTURE) IMPLANT
SUT VIC AB 2-0 CT1 27 (SUTURE)
SUT VIC AB 2-0 CT1 TAPERPNT 27 (SUTURE) IMPLANT
SUT VIC AB 3-0 FS2 27 (SUTURE) IMPLANT
SYR CONTROL 10ML LL (SYRINGE) IMPLANT
SYSTEM CHEST DRAIN TLS 7FR (DRAIN) IMPLANT
TOWEL OR 17X24 6PK STRL BLUE (TOWEL DISPOSABLE) ×3 IMPLANT
TOWEL OR 17X26 10 PK STRL BLUE (TOWEL DISPOSABLE) ×3 IMPLANT
TUBE CONNECTING 12'X1/4 (SUCTIONS)
TUBE CONNECTING 12X1/4 (SUCTIONS) IMPLANT
TUBE EVACUATION TLS (MISCELLANEOUS) ×3 IMPLANT
UNDERPAD 30X30 (UNDERPADS AND DIAPERS) ×3 IMPLANT
WATER STERILE IRR 1000ML POUR (IV SOLUTION) ×3 IMPLANT

## 2017-03-22 NOTE — Transfer of Care (Signed)
Immediate Anesthesia Transfer of Care Note  Patient: Anthony Hebert  Procedure(s) Performed: Procedure(s) with comments: OPEN CARPAL TUNNEL RELEASE (Left) - 60 mins  Patient Location: PACU  Anesthesia Type:General  Level of Consciousness: awake, alert  and oriented  Airway & Oxygen Therapy: Patient Spontanous Breathing and Patient connected to nasal cannula oxygen  Post-op Assessment: Report given to RN, Post -op Vital signs reviewed and stable and Patient moving all extremities X 4  Post vital signs: Reviewed and stable  Last Vitals:  Vitals:   03/22/17 1107 03/22/17 1412  BP: (!) 146/74 124/82  Pulse: 73 79  Resp: 18 16  Temp: 36.8 C 36.7 C  SpO2: 96% 99%    Last Pain:  Vitals:   03/22/17 1107  TempSrc: Oral         Complications: No apparent anesthesia complications

## 2017-03-22 NOTE — Anesthesia Preprocedure Evaluation (Addendum)
Anesthesia Evaluation  Patient identified by MRN, date of birth, ID band Patient awake    Reviewed: Allergy & Precautions, H&P , NPO status , Patient's Chart, lab work & pertinent test results  History of Anesthesia Complications (+) DIFFICULT AIRWAY and history of anesthetic complications  Airway Mallampati: IV  TM Distance: >3 FB Neck ROM: Full  Mouth opening: Limited Mouth Opening Comment: Large beard present Dental  (+) Teeth Intact, Dental Advisory Given   Pulmonary    Pulmonary exam normal breath sounds clear to auscultation       Cardiovascular hypertension, Pt. on medications + CAD  Normal cardiovascular exam+ Valvular Problems/Murmurs  Rhythm:Regular + Systolic murmurs    Neuro/Psych    GI/Hepatic GERD  Medicated and Controlled,  Endo/Other    Renal/GU      Musculoskeletal  (+) Arthritis ,   Abdominal   Peds  Hematology   Anesthesia Other Findings   Reproductive/Obstetrics                            Anesthesia Physical  Anesthesia Plan  ASA: III  Anesthesia Plan: General   Post-op Pain Management:    Induction: Intravenous  PONV Risk Score and Plan: 2 and Ondansetron and Dexamethasone  Airway Management Planned: LMA  Additional Equipment:   Intra-op Plan:   Post-operative Plan: Extubation in OR  Informed Consent: I have reviewed the patients History and Physical, chart, labs and discussed the procedure including the risks, benefits and alternatives for the proposed anesthesia with the patient or authorized representative who has indicated his/her understanding and acceptance.   Dental advisory given  Plan Discussed with: CRNA  Anesthesia Plan Comments:       Anesthesia Quick Evaluation

## 2017-03-22 NOTE — Discharge Instructions (Signed)

## 2017-03-22 NOTE — Anesthesia Postprocedure Evaluation (Signed)
Anesthesia Post Note  Patient: Anthony Hebert  Procedure(s) Performed: Procedure(s) (LRB): OPEN CARPAL TUNNEL RELEASE (Left)     Patient location during evaluation: PACU Anesthesia Type: General Level of consciousness: sedated and patient cooperative Pain management: pain level controlled Vital Signs Assessment: post-procedure vital signs reviewed and stable Respiratory status: spontaneous breathing Cardiovascular status: stable Anesthetic complications: no    Last Vitals:  Vitals:   03/22/17 1107 03/22/17 1412  BP: (!) 146/74 124/82  Pulse: 73 79  Resp: 18 16  Temp: 36.8 C 36.7 C  SpO2: 96% 99%    Last Pain:  Vitals:   03/22/17 1107  TempSrc: Oral                 Nolon Nations

## 2017-03-22 NOTE — H&P (Signed)
Anthony Hebert is an 66 y.o. male.   Chief Complaint: Left hand numbness and tingling HPI: As per pleasant 66 year old gentleman with history of bilateral carpal tunnel syndrome he's previous at a right carpal tunnel was released with notable aberrant nerve branch Median nerve distribution. He presents today for a left carpal tunnel release. Discussed all issues with him today desires proceed with surgical endeavors.  Past Medical History:  Diagnosis Date  . Arthritis    "all over"  . Chronic back pain    "all over"  . Complication of anesthesia    slow to wake up at times  . Difficult intubation    had trouble with intubation shape of throat;last sx no problems per pt; 03/28/16 successful oral ETT with green Glidescope, stylet. Has limited oral opening/small glottic opening  . Foot swelling    in AA 07/2013  . GERD (gastroesophageal reflux disease)    occ  . Gout of left foot   . Heart murmur    states no problems  . Hyperlipidemia   . Hypertension   . Melanoma ([place]) ~ 2010   "above left eye"  . Stroke [facility])    "maybe w/my 1st back OR in the 1990's"; denies residual on 07/15/2015    Past Surgical History:  Procedure Laterality Date  . APPENDECTOMY    . BACK SURGERY    . CARPAL TUNNEL RELEASE Right 07/15/2015   w/GUYON'S CANAL RELEASE   . CARPAL TUNNEL RELEASE Right 07/15/2015   Procedure: RIGHT FOREARM AND WRIST CARPAL TUNNEL RELEASE;  Surgeon: Roseanne Kaufman, MD;  Location: [place];  Service: Orthopedics;  Laterality: Right;  . COLONOSCOPY    . FOOT SURGERY Right    cut nerve to relieve pain  . INGUINAL HERNIA REPAIR Left 1994  . INSERTION OF MESH N/A 03/28/2016   Procedure: INSERTION OF MESH;  Surgeon: Rolm Bookbinder, MD;  Location: [place];  Service: General;  Laterality: N/A;  . MELANOMA EXCISION    . POSTERIOR CERVICAL LAMINECTOMY Right 10/07/2013   Procedure: POSTERIOR CERVICAL LAMINECTOMY RIGHT CERVICAL SEVEN -THORACIC ONE FORAMINOTOMY;  Surgeon: Faythe Ghee, MD;   Location: [place] [facility];  Service: Neurosurgery;  Laterality: Right;  . POSTERIOR LUMBAR FUSION  X 7  . SPINAL CORD STIMULATOR IMPLANT  ~ 2012  . SPINAL CORD STIMULATOR REMOVAL N/A 09/19/2012   Procedure: LUMBAR SPINAL CORD STIMULATOR REMOVAL;  Surgeon: Faythe Ghee, MD;  Location: [place] [facility];  Service: Neurosurgery;  Laterality: N/A;  . TONSILLECTOMY    . ULNAR NERVE TRANSPOSITION Right 07/15/2015   supercharged AIN to deep motor branch ulnar nerve nerve transfer  . UMBILICAL HERNIA REPAIR N/A 03/28/2016   Procedure: LAPAROSCOPIC UMBILICAL HERNIA;  Surgeon: Rolm Bookbinder, MD;  Location: [facility];  Service: General;  Laterality: N/A;    Family History  Problem Relation Age of Onset  . Alzheimer's disease Father   . Cancer Mother   . Colon cancer Neg Hx    Social History:  reports that he has never smoked. He has never used smokeless tobacco. He reports that he drinks about 0.6 - 1.2 oz of alcohol per week . He reports that he does not use drugs.  Allergies:  Allergies  Allergen Reactions  . No Known Allergies     Medications Prior to Admission  Medication Sig Dispense Refill  . COLCRYS 0.6 MG tablet Take 0.6 mg by mouth every 8 (eight) hours as needed (gout).   0  . HYDROcodone-acetaminophen (NORCO/VICODIN) 5-325 MG tablet  Take 1 tablet by mouth daily as needed for moderate pain.    Marland Kitchen lisinopril-hydrochlorothiazide (PRINZIDE,ZESTORETIC) 20-12.5 MG tablet Take 1 tablet by mouth daily.    . nabumetone (RELAFEN) 500 MG tablet Take 500 mg by mouth daily as needed (muscle relaxation).    . simvastatin (ZOCOR) 40 MG tablet Take 40 mg by mouth every evening.    . TURMERIC PO Take 1 tablet by mouth daily.    . famotidine (PEPCID) 10 MG tablet Take 10 mg by mouth daily as needed for heartburn or indigestion.       No results found for this or any previous visit (from the past 48 hour(s)). No results found.  Review of Systems  Constitutional: Negative.   HENT: Negative.   Eyes:  Negative.   Respiratory: Negative.   Cardiovascular: Negative.   Gastrointestinal: Negative.   Musculoskeletal:       See history of present illness  Skin: Negative.     Blood pressure (!) 146/74, pulse 73, temperature 98.2 F (36.8 C), temperature source Oral, resp. rate 18, height 5\' 6"  (1.676 m), weight 90.7 kg (200 lb), SpO2 96 %. Physical Exam  The patient is alert and oriented in no acute distress. The patient complains of pain in the affected upper extremity.  The patient is noted to have a normal HEENT exam. Lung fields show equal chest expansion and no shortness of breath. Abdomen exam is nontender without distention. Lower extremity examination does not show any fracture dislocation or blood clot symptoms. Pelvis is stable and the neck and back are stable and nontender. Examination of the left upper extremity shows that he has numbness tingling median nerve distribution he remains positive median nerve compression, Tinel's and Phalen's. No signs of infection are present. Assessment/Plan s/p carpal tunnel syndrome status post right  open carpal tunnel release We are planning surgery for your upper extremity. The risk and benefits of surgery to include risk of bleeding, infection, anesthesia,  damage to normal structures and failure of the surgery to accomplish its intended goals of relieving symptoms and restoring function have been discussed in detail. With this in mind we plan to proceed. I have specifically discussed with the patient the pre-and postoperative regime and the dos and don'ts and risk and benefits in great detail. Risk and benefits of surgery also include risk of dystrophy(CRPS), chronic nerve pain, failure of the healing process to go onto completion and other inherent risks of surgery The relavent the pathophysiology of the disease/injury process, as well as the alternatives for treatment and postoperative course of action has been discussed in great detail with the  patient who desires to proceed.  We will do everything in our power to help you (the patient) restore function to the upper extremity. It is a pleasure to see this patient today.   Canisha Issac L, PA-C 03/22/2017, 12:29 PM

## 2017-03-22 NOTE — Op Note (Signed)
See [id number] SP Left CTR Daila Elbert MD

## 2017-03-22 NOTE — Anesthesia Procedure Notes (Signed)
Procedure Name: LMA Insertion Date/Time: 03/22/2017 1:15 PM Performed by: Rejeana Brock L Pre-anesthesia Checklist: Patient identified, Emergency Drugs available, Suction available and Patient being monitored Patient Re-evaluated:Patient Re-evaluated prior to induction Oxygen Delivery Method: Circle system utilized Preoxygenation: Pre-oxygenation with 100% oxygen Induction Type: IV induction LMA: LMA inserted LMA Size: 4.0 Tube type: Oral Number of attempts: 1 Placement Confirmation: positive ETCO2 and breath sounds checked- equal and bilateral Tube secured with: Tape Dental Injury: Teeth and Oropharynx as per pre-operative assessment

## 2017-03-23 ENCOUNTER — Encounter (HOSPITAL_COMMUNITY): Payer: Self-pay | Admitting: Orthopedic Surgery

## 2017-03-23 NOTE — Op Note (Signed)
NAMEJERAMIE, Anthony Hebert NO.:  [id number]  MEDICAL RECORD NO.:  [id number]  LOCATION:                                 FACILITY:  PHYSICIAN:  Satira Anis. Amedeo Plenty, M.D.     DATE OF BIRTH:  DATE OF PROCEDURE:  03/22/2017 DATE OF DISCHARGE:                              OPERATIVE REPORT   PREOPERATIVE DIAGNOSIS:  Left carpal tunnel syndrome (chronic compression, median nerve).  POSTOPERATIVE DIAGNOSIS:  Left carpal tunnel syndrome (chronic compression, median nerve).  PROCEDURE:  Open carpal tunnel release, left upper extremity at the wrist region.  SURGEON:  Satira Anis. Amedeo Plenty, M.D.  ASSISTANT:  Avelina Laine, P.A.-C.  COMPLICATIONS:  None.  ANESTHESIA:  LMA, general.  TOURNIQUET TIME:  Less than 20 minutes.  INDICATIONS:  A 66 year old male presents with the above-mentioned diagnosis.  I have counseled him in regard to risks and benefits of surgery and he desires to proceed.  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  Due to these issues, we applied a general anesthetic as opposed to a local only.  He understands the risks and benefits.  OPERATIVE PROCEDURE:  The patient was seen by myself and Anesthesia, taken to operative theater, underwent smooth induction of Hibiclens pre- scrub followed by Betadine surgical scrub and paint performed by myself and Mr. Jenean Lindau.  Following this, outline marks were made.  Time-out observed.  Incision was made 1 inch in nature in line with radial border of the ring finger.  Dissection was carried down.  Palmar fascia was incised.  Distal edge of the transverse carpal ligament was very carefully identified and released.  Superficial palmar arch was identified and all constricting bands were released.  There was no bifid look to the nerve distally.  At this time, I then dissected in a distal to proximal direction until  adequate room was available for direct release proximally.  I performed this under 4.5 expanded loupe magnification with light source.  All passes and releases were done under direct vision off the ulnar ledge.  The patient tolerated this well.  The median nerve was identified.  It was hyperemic, intact and had evidence of chronic compression, but nevertheless there was no bifid morphology.  The patient had the tourniquet deflated at this time, irrigation secured and the wound closed with Prolene.  There were no complicating features.  Adaptic, Xeroform, standard dressing, and a splint were applied.  He will see [place] back in the office in a week. Percocet p.r.n. pain.  Notify us should any problems occur.  Do's and don'ts have been discussed.  This was an uncomplicated carpal tunnel release with normal anatomy about the region.  All questions have been encouraged and answered.  I did dissect and released portions of the antebrachial fascia without complicating feature.     Satira Anis. Amedeo Plenty, M.D.   ______________________________ Satira Anis. Amedeo Plenty, M.D.    [facility]  D:  03/22/2017  T:  03/22/2017  Job:  [id number]

## 2017-03-29 DIAGNOSIS — G5602 Carpal tunnel syndrome, left upper limb: Secondary | ICD-10-CM | POA: Diagnosis not present

## 2017-04-05 DIAGNOSIS — G5602 Carpal tunnel syndrome, left upper limb: Secondary | ICD-10-CM | POA: Diagnosis not present

## 2017-04-19 DIAGNOSIS — G5602 Carpal tunnel syndrome, left upper limb: Secondary | ICD-10-CM | POA: Diagnosis not present

## 2017-05-31 DIAGNOSIS — M25532 Pain in left wrist: Secondary | ICD-10-CM | POA: Diagnosis not present

## 2017-07-03 DIAGNOSIS — Z85828 Personal history of other malignant neoplasm of skin: Secondary | ICD-10-CM | POA: Diagnosis not present

## 2017-07-03 DIAGNOSIS — D1801 Hemangioma of skin and subcutaneous tissue: Secondary | ICD-10-CM | POA: Diagnosis not present

## 2017-07-03 DIAGNOSIS — C4442 Squamous cell carcinoma of skin of scalp and neck: Secondary | ICD-10-CM | POA: Diagnosis not present

## 2017-07-03 DIAGNOSIS — D485 Neoplasm of uncertain behavior of skin: Secondary | ICD-10-CM | POA: Diagnosis not present

## 2017-07-03 DIAGNOSIS — L820 Inflamed seborrheic keratosis: Secondary | ICD-10-CM | POA: Diagnosis not present

## 2017-07-03 DIAGNOSIS — Z85820 Personal history of malignant melanoma of skin: Secondary | ICD-10-CM | POA: Diagnosis not present

## 2017-07-03 DIAGNOSIS — L821 Other seborrheic keratosis: Secondary | ICD-10-CM | POA: Diagnosis not present

## 2017-07-03 DIAGNOSIS — L814 Other melanin hyperpigmentation: Secondary | ICD-10-CM | POA: Diagnosis not present

## 2017-07-17 DIAGNOSIS — Z23 Encounter for immunization: Secondary | ICD-10-CM | POA: Diagnosis not present

## 2017-07-17 DIAGNOSIS — R079 Chest pain, unspecified: Secondary | ICD-10-CM | POA: Diagnosis not present

## 2017-08-13 DIAGNOSIS — M25462 Effusion, left knee: Secondary | ICD-10-CM | POA: Diagnosis not present

## 2017-08-13 DIAGNOSIS — L03116 Cellulitis of left lower limb: Secondary | ICD-10-CM | POA: Diagnosis not present

## 2017-08-13 DIAGNOSIS — M109 Gout, unspecified: Secondary | ICD-10-CM | POA: Diagnosis not present

## 2017-08-17 DIAGNOSIS — M25562 Pain in left knee: Secondary | ICD-10-CM | POA: Diagnosis not present

## 2017-08-22 DIAGNOSIS — I10 Essential (primary) hypertension: Secondary | ICD-10-CM | POA: Diagnosis not present

## 2017-08-22 DIAGNOSIS — Z6832 Body mass index (BMI) 32.0-32.9, adult: Secondary | ICD-10-CM | POA: Diagnosis not present

## 2017-08-22 DIAGNOSIS — M5416 Radiculopathy, lumbar region: Secondary | ICD-10-CM | POA: Diagnosis not present

## 2017-08-27 ENCOUNTER — Other Ambulatory Visit: Payer: Self-pay | Admitting: Neurosurgery

## 2017-08-27 DIAGNOSIS — M5416 Radiculopathy, lumbar region: Secondary | ICD-10-CM

## 2017-09-06 DIAGNOSIS — C4442 Squamous cell carcinoma of skin of scalp and neck: Secondary | ICD-10-CM | POA: Diagnosis not present

## 2017-09-10 ENCOUNTER — Ambulatory Visit
Admission: RE | Admit: 2017-09-10 | Discharge: 2017-09-10 | Disposition: A | Discharge status: Home / Self Care | Payer: Medicare HMO | Source: Ambulatory Visit | Attending: Neurosurgery | Admitting: Neurosurgery

## 2017-09-10 DIAGNOSIS — S3992XA Unspecified injury of lower back, initial encounter: Secondary | ICD-10-CM | POA: Diagnosis not present

## 2017-09-10 DIAGNOSIS — M5125 Other intervertebral disc displacement, thoracolumbar region: Secondary | ICD-10-CM | POA: Diagnosis not present

## 2017-09-10 DIAGNOSIS — M546 Pain in thoracic spine: Secondary | ICD-10-CM | POA: Diagnosis not present

## 2017-09-10 DIAGNOSIS — M5416 Radiculopathy, lumbar region: Secondary | ICD-10-CM

## 2017-09-10 MED ORDER — GADOBENATE DIMEGLUMINE 529 MG/ML IV SOLN: 19.0000 mL | Freq: Once | INTRAVENOUS | Status: DC | PRN

## 2017-09-12 DIAGNOSIS — G5602 Carpal tunnel syndrome, left upper limb: Secondary | ICD-10-CM | POA: Diagnosis not present

## 2017-09-12 DIAGNOSIS — M25532 Pain in left wrist: Secondary | ICD-10-CM | POA: Diagnosis not present

## 2017-09-12 DIAGNOSIS — M65331 Trigger finger, right middle finger: Secondary | ICD-10-CM | POA: Diagnosis not present

## 2017-09-12 DIAGNOSIS — M5030 Other cervical disc degeneration, unspecified cervical region: Secondary | ICD-10-CM | POA: Diagnosis not present

## 2017-09-12 DIAGNOSIS — M79642 Pain in left hand: Secondary | ICD-10-CM | POA: Diagnosis not present

## 2017-09-12 DIAGNOSIS — M79641 Pain in right hand: Secondary | ICD-10-CM | POA: Diagnosis not present

## 2017-09-12 DIAGNOSIS — M65341 Trigger finger, right ring finger: Secondary | ICD-10-CM | POA: Diagnosis not present

## 2017-09-19 DIAGNOSIS — M5416 Radiculopathy, lumbar region: Secondary | ICD-10-CM | POA: Diagnosis not present

## 2017-09-19 DIAGNOSIS — I10 Essential (primary) hypertension: Secondary | ICD-10-CM | POA: Diagnosis not present

## 2017-09-19 DIAGNOSIS — Z6832 Body mass index (BMI) 32.0-32.9, adult: Secondary | ICD-10-CM | POA: Diagnosis not present

## 2017-12-26 DIAGNOSIS — I10 Essential (primary) hypertension: Secondary | ICD-10-CM | POA: Diagnosis not present

## 2017-12-26 DIAGNOSIS — H25813 Combined forms of age-related cataract, bilateral: Secondary | ICD-10-CM | POA: Diagnosis not present

## 2017-12-26 DIAGNOSIS — H527 Unspecified disorder of refraction: Secondary | ICD-10-CM | POA: Diagnosis not present

## 2018-01-15 DIAGNOSIS — Z85828 Personal history of other malignant neoplasm of skin: Secondary | ICD-10-CM | POA: Diagnosis not present

## 2018-01-15 DIAGNOSIS — L821 Other seborrheic keratosis: Secondary | ICD-10-CM | POA: Diagnosis not present

## 2018-01-15 DIAGNOSIS — Z85820 Personal history of malignant melanoma of skin: Secondary | ICD-10-CM | POA: Diagnosis not present

## 2018-01-15 DIAGNOSIS — D485 Neoplasm of uncertain behavior of skin: Secondary | ICD-10-CM | POA: Diagnosis not present

## 2018-01-15 DIAGNOSIS — L918 Other hypertrophic disorders of the skin: Secondary | ICD-10-CM | POA: Diagnosis not present

## 2018-01-15 DIAGNOSIS — L570 Actinic keratosis: Secondary | ICD-10-CM | POA: Diagnosis not present

## 2018-01-30 DIAGNOSIS — W57XXXA Bitten or stung by nonvenomous insect and other nonvenomous arthropods, initial encounter: Secondary | ICD-10-CM | POA: Diagnosis not present

## 2018-01-30 DIAGNOSIS — M150 Primary generalized (osteo)arthritis: Secondary | ICD-10-CM | POA: Diagnosis not present

## 2018-01-30 DIAGNOSIS — I10 Essential (primary) hypertension: Secondary | ICD-10-CM | POA: Diagnosis not present

## 2018-01-30 DIAGNOSIS — Z125 Encounter for screening for malignant neoplasm of prostate: Secondary | ICD-10-CM | POA: Diagnosis not present

## 2018-01-30 DIAGNOSIS — I358 Other nonrheumatic aortic valve disorders: Secondary | ICD-10-CM | POA: Diagnosis not present

## 2018-01-30 DIAGNOSIS — Z0000 Encounter for general adult medical examination without abnormal findings: Secondary | ICD-10-CM | POA: Diagnosis not present

## 2018-01-30 DIAGNOSIS — M109 Gout, unspecified: Secondary | ICD-10-CM | POA: Diagnosis not present

## 2018-01-30 DIAGNOSIS — C4330 Malignant melanoma of unspecified part of face: Secondary | ICD-10-CM | POA: Diagnosis not present

## 2018-01-30 DIAGNOSIS — E785 Hyperlipidemia, unspecified: Secondary | ICD-10-CM | POA: Diagnosis not present

## 2018-02-19 DIAGNOSIS — M109 Gout, unspecified: Secondary | ICD-10-CM | POA: Diagnosis not present

## 2018-02-25 DIAGNOSIS — M109 Gout, unspecified: Secondary | ICD-10-CM | POA: Diagnosis not present

## 2018-02-26 DIAGNOSIS — M109 Gout, unspecified: Secondary | ICD-10-CM | POA: Diagnosis not present

## 2018-02-26 DIAGNOSIS — L509 Urticaria, unspecified: Secondary | ICD-10-CM | POA: Diagnosis not present

## 2018-03-01 ENCOUNTER — Other Ambulatory Visit (HOSPITAL_COMMUNITY): Payer: Self-pay | Admitting: Family Medicine

## 2018-03-01 DIAGNOSIS — M109 Gout, unspecified: Secondary | ICD-10-CM | POA: Diagnosis not present

## 2018-03-01 DIAGNOSIS — R7989 Other specified abnormal findings of blood chemistry: Secondary | ICD-10-CM | POA: Diagnosis not present

## 2018-03-01 DIAGNOSIS — I358 Other nonrheumatic aortic valve disorders: Secondary | ICD-10-CM

## 2018-03-11 ENCOUNTER — Ambulatory Visit (HOSPITAL_COMMUNITY): Payer: Medicare HMO | Attending: Internal Medicine

## 2018-03-11 ENCOUNTER — Other Ambulatory Visit: Payer: Self-pay

## 2018-03-11 DIAGNOSIS — I358 Other nonrheumatic aortic valve disorders: Secondary | ICD-10-CM | POA: Diagnosis not present

## 2018-03-11 DIAGNOSIS — R011 Cardiac murmur, unspecified: Secondary | ICD-10-CM | POA: Diagnosis not present

## 2018-03-11 DIAGNOSIS — E785 Hyperlipidemia, unspecified: Secondary | ICD-10-CM | POA: Diagnosis not present

## 2018-03-11 DIAGNOSIS — I10 Essential (primary) hypertension: Secondary | ICD-10-CM | POA: Insufficient documentation

## 2018-03-11 DIAGNOSIS — I082 Rheumatic disorders of both aortic and tricuspid valves: Secondary | ICD-10-CM | POA: Insufficient documentation

## 2018-03-11 MED ORDER — PERFLUTREN LIPID MICROSPHERE
1.0000 mL | INTRAVENOUS | Status: AC | PRN
  Administered 2018-03-11: 2 mL via INTRAVENOUS

## 2018-03-26 DIAGNOSIS — M25562 Pain in left knee: Secondary | ICD-10-CM | POA: Diagnosis not present

## 2018-03-27 DIAGNOSIS — I10 Essential (primary) hypertension: Secondary | ICD-10-CM | POA: Diagnosis not present

## 2018-03-27 DIAGNOSIS — M5416 Radiculopathy, lumbar region: Secondary | ICD-10-CM | POA: Diagnosis not present

## 2018-03-27 DIAGNOSIS — Z6832 Body mass index (BMI) 32.0-32.9, adult: Secondary | ICD-10-CM | POA: Diagnosis not present

## 2018-04-02 DIAGNOSIS — M109 Gout, unspecified: Secondary | ICD-10-CM | POA: Diagnosis not present

## 2018-05-03 DIAGNOSIS — Z125 Encounter for screening for malignant neoplasm of prostate: Secondary | ICD-10-CM | POA: Diagnosis not present

## 2018-06-04 DIAGNOSIS — Z23 Encounter for immunization: Secondary | ICD-10-CM | POA: Diagnosis not present

## 2018-06-04 DIAGNOSIS — E785 Hyperlipidemia, unspecified: Secondary | ICD-10-CM | POA: Diagnosis not present

## 2018-06-04 DIAGNOSIS — I10 Essential (primary) hypertension: Secondary | ICD-10-CM | POA: Diagnosis not present

## 2018-06-04 DIAGNOSIS — I358 Other nonrheumatic aortic valve disorders: Secondary | ICD-10-CM | POA: Diagnosis not present

## 2018-06-04 DIAGNOSIS — M109 Gout, unspecified: Secondary | ICD-10-CM | POA: Diagnosis not present

## 2018-06-20 DIAGNOSIS — M79642 Pain in left hand: Secondary | ICD-10-CM | POA: Diagnosis not present

## 2018-06-20 DIAGNOSIS — M79641 Pain in right hand: Secondary | ICD-10-CM | POA: Diagnosis not present

## 2018-06-20 DIAGNOSIS — M65341 Trigger finger, right ring finger: Secondary | ICD-10-CM | POA: Diagnosis not present

## 2018-07-02 DIAGNOSIS — L820 Inflamed seborrheic keratosis: Secondary | ICD-10-CM | POA: Diagnosis not present

## 2018-07-02 DIAGNOSIS — Z85820 Personal history of malignant melanoma of skin: Secondary | ICD-10-CM | POA: Diagnosis not present

## 2018-07-02 DIAGNOSIS — L570 Actinic keratosis: Secondary | ICD-10-CM | POA: Diagnosis not present

## 2018-07-02 DIAGNOSIS — D225 Melanocytic nevi of trunk: Secondary | ICD-10-CM | POA: Diagnosis not present

## 2018-07-02 DIAGNOSIS — Z85828 Personal history of other malignant neoplasm of skin: Secondary | ICD-10-CM | POA: Diagnosis not present

## 2018-07-18 DIAGNOSIS — M6530 Trigger finger, unspecified finger: Secondary | ICD-10-CM | POA: Diagnosis not present

## 2018-07-18 DIAGNOSIS — M65331 Trigger finger, right middle finger: Secondary | ICD-10-CM | POA: Diagnosis not present

## 2018-07-18 DIAGNOSIS — M65342 Trigger finger, left ring finger: Secondary | ICD-10-CM | POA: Diagnosis not present

## 2018-07-18 DIAGNOSIS — M65322 Trigger finger, left index finger: Secondary | ICD-10-CM | POA: Diagnosis not present

## 2018-07-18 DIAGNOSIS — M65341 Trigger finger, right ring finger: Secondary | ICD-10-CM | POA: Diagnosis not present

## 2018-07-18 DIAGNOSIS — M65332 Trigger finger, left middle finger: Secondary | ICD-10-CM | POA: Diagnosis not present

## 2018-09-11 DIAGNOSIS — H109 Unspecified conjunctivitis: Secondary | ICD-10-CM | POA: Diagnosis not present

## 2018-09-26 DIAGNOSIS — I10 Essential (primary) hypertension: Secondary | ICD-10-CM | POA: Diagnosis not present

## 2018-09-26 DIAGNOSIS — Z6833 Body mass index (BMI) 33.0-33.9, adult: Secondary | ICD-10-CM | POA: Diagnosis not present

## 2018-09-26 DIAGNOSIS — M5416 Radiculopathy, lumbar region: Secondary | ICD-10-CM | POA: Diagnosis not present

## 2018-11-15 DIAGNOSIS — I358 Other nonrheumatic aortic valve disorders: Secondary | ICD-10-CM | POA: Diagnosis not present

## 2018-11-15 DIAGNOSIS — E785 Hyperlipidemia, unspecified: Secondary | ICD-10-CM | POA: Diagnosis not present

## 2018-11-15 DIAGNOSIS — Z79899 Other long term (current) drug therapy: Secondary | ICD-10-CM | POA: Diagnosis not present

## 2018-11-15 DIAGNOSIS — M109 Gout, unspecified: Secondary | ICD-10-CM | POA: Diagnosis not present

## 2018-11-15 DIAGNOSIS — I10 Essential (primary) hypertension: Secondary | ICD-10-CM | POA: Diagnosis not present

## 2018-12-09 DIAGNOSIS — W57XXXA Bitten or stung by nonvenomous insect and other nonvenomous arthropods, initial encounter: Secondary | ICD-10-CM | POA: Diagnosis not present

## 2018-12-09 DIAGNOSIS — S90462A Insect bite (nonvenomous), left great toe, initial encounter: Secondary | ICD-10-CM | POA: Diagnosis not present

## 2018-12-23 DIAGNOSIS — M79641 Pain in right hand: Secondary | ICD-10-CM | POA: Diagnosis not present

## 2018-12-23 DIAGNOSIS — M25531 Pain in right wrist: Secondary | ICD-10-CM | POA: Diagnosis not present

## 2018-12-23 DIAGNOSIS — M65342 Trigger finger, left ring finger: Secondary | ICD-10-CM | POA: Diagnosis not present

## 2018-12-23 DIAGNOSIS — M65332 Trigger finger, left middle finger: Secondary | ICD-10-CM | POA: Diagnosis not present

## 2018-12-23 DIAGNOSIS — M65352 Trigger finger, left little finger: Secondary | ICD-10-CM | POA: Diagnosis not present

## 2018-12-23 DIAGNOSIS — M79642 Pain in left hand: Secondary | ICD-10-CM | POA: Diagnosis not present

## 2018-12-23 DIAGNOSIS — M65322 Trigger finger, left index finger: Secondary | ICD-10-CM | POA: Diagnosis not present

## 2019-01-01 DIAGNOSIS — H524 Presbyopia: Secondary | ICD-10-CM | POA: Diagnosis not present

## 2019-01-01 DIAGNOSIS — H25813 Combined forms of age-related cataract, bilateral: Secondary | ICD-10-CM | POA: Diagnosis not present

## 2019-01-10 DIAGNOSIS — Z85820 Personal history of malignant melanoma of skin: Secondary | ICD-10-CM | POA: Diagnosis not present

## 2019-01-10 DIAGNOSIS — L570 Actinic keratosis: Secondary | ICD-10-CM | POA: Diagnosis not present

## 2019-01-10 DIAGNOSIS — Z85828 Personal history of other malignant neoplasm of skin: Secondary | ICD-10-CM | POA: Diagnosis not present

## 2019-01-10 DIAGNOSIS — L814 Other melanin hyperpigmentation: Secondary | ICD-10-CM | POA: Diagnosis not present

## 2019-01-10 DIAGNOSIS — D229 Melanocytic nevi, unspecified: Secondary | ICD-10-CM | POA: Diagnosis not present

## 2019-01-10 DIAGNOSIS — L821 Other seborrheic keratosis: Secondary | ICD-10-CM | POA: Diagnosis not present

## 2019-01-10 DIAGNOSIS — L820 Inflamed seborrheic keratosis: Secondary | ICD-10-CM | POA: Diagnosis not present

## 2019-02-04 DIAGNOSIS — I350 Nonrheumatic aortic (valve) stenosis: Secondary | ICD-10-CM | POA: Diagnosis not present

## 2019-02-04 DIAGNOSIS — G8929 Other chronic pain: Secondary | ICD-10-CM | POA: Diagnosis not present

## 2019-02-04 DIAGNOSIS — Z125 Encounter for screening for malignant neoplasm of prostate: Secondary | ICD-10-CM | POA: Diagnosis not present

## 2019-02-04 DIAGNOSIS — Z0000 Encounter for general adult medical examination without abnormal findings: Secondary | ICD-10-CM | POA: Diagnosis not present

## 2019-02-04 DIAGNOSIS — E785 Hyperlipidemia, unspecified: Secondary | ICD-10-CM | POA: Diagnosis not present

## 2019-02-04 DIAGNOSIS — M109 Gout, unspecified: Secondary | ICD-10-CM | POA: Diagnosis not present

## 2019-02-04 DIAGNOSIS — Z23 Encounter for immunization: Secondary | ICD-10-CM | POA: Diagnosis not present

## 2019-02-04 DIAGNOSIS — M545 Low back pain: Secondary | ICD-10-CM | POA: Diagnosis not present

## 2019-02-04 DIAGNOSIS — I10 Essential (primary) hypertension: Secondary | ICD-10-CM | POA: Diagnosis not present

## 2019-03-27 DIAGNOSIS — I10 Essential (primary) hypertension: Secondary | ICD-10-CM | POA: Diagnosis not present

## 2019-03-27 DIAGNOSIS — Z6832 Body mass index (BMI) 32.0-32.9, adult: Secondary | ICD-10-CM | POA: Diagnosis not present

## 2019-03-27 DIAGNOSIS — M5416 Radiculopathy, lumbar region: Secondary | ICD-10-CM | POA: Diagnosis not present

## 2019-05-05 DIAGNOSIS — M65342 Trigger finger, left ring finger: Secondary | ICD-10-CM | POA: Diagnosis not present

## 2019-05-05 DIAGNOSIS — M65322 Trigger finger, left index finger: Secondary | ICD-10-CM | POA: Diagnosis not present

## 2019-05-05 DIAGNOSIS — M65352 Trigger finger, left little finger: Secondary | ICD-10-CM | POA: Diagnosis not present

## 2019-05-05 DIAGNOSIS — M65332 Trigger finger, left middle finger: Secondary | ICD-10-CM | POA: Diagnosis not present

## 2019-05-05 DIAGNOSIS — M65312 Trigger thumb, left thumb: Secondary | ICD-10-CM | POA: Diagnosis not present

## 2019-06-16 DIAGNOSIS — D225 Melanocytic nevi of trunk: Secondary | ICD-10-CM | POA: Diagnosis not present

## 2019-06-16 DIAGNOSIS — D229 Melanocytic nevi, unspecified: Secondary | ICD-10-CM | POA: Diagnosis not present

## 2019-06-16 DIAGNOSIS — Z85828 Personal history of other malignant neoplasm of skin: Secondary | ICD-10-CM | POA: Diagnosis not present

## 2019-06-16 DIAGNOSIS — L816 Other disorders of diminished melanin formation: Secondary | ICD-10-CM | POA: Diagnosis not present

## 2019-06-16 DIAGNOSIS — B079 Viral wart, unspecified: Secondary | ICD-10-CM | POA: Diagnosis not present

## 2019-06-16 DIAGNOSIS — D485 Neoplasm of uncertain behavior of skin: Secondary | ICD-10-CM | POA: Diagnosis not present

## 2019-06-16 DIAGNOSIS — L820 Inflamed seborrheic keratosis: Secondary | ICD-10-CM | POA: Diagnosis not present

## 2019-06-16 DIAGNOSIS — Z872 Personal history of diseases of the skin and subcutaneous tissue: Secondary | ICD-10-CM | POA: Diagnosis not present

## 2019-07-02 DIAGNOSIS — D225 Melanocytic nevi of trunk: Secondary | ICD-10-CM | POA: Diagnosis not present

## 2019-07-02 DIAGNOSIS — L905 Scar conditions and fibrosis of skin: Secondary | ICD-10-CM | POA: Diagnosis not present

## 2019-09-03 ENCOUNTER — Ambulatory Visit: Payer: Medicare HMO | Attending: Internal Medicine

## 2019-09-03 DIAGNOSIS — Z23 Encounter for immunization: Secondary | ICD-10-CM

## 2019-09-03 NOTE — Progress Notes (Signed)
   Covid-19 Vaccination Clinic  Name:  Anthony Hebert    MRN: [id number] DOB: Mar 15, 1951  09/03/2019  [name] was observed post Covid-19 immunization for 15 minutes without incidence. He was provided with Vaccine Information Sheet and instruction to access the V-Safe system.   [name] was instructed to call 911 with any severe reactions post vaccine: Marland Kitchen Difficulty breathing  . Swelling of your face and throat  . A fast heartbeat  . A bad rash all over your body  . Dizziness and weakness    Immunizations Administered    Name Date Dose VIS Date Route   Pfizer COVID-19 Vaccine 09/03/2019  6:06 PM 0.3 mL 07/25/2019 Intramuscular   Manufacturer: [place]   Lot: [id number]   [place]: [id number]

## 2019-09-24 ENCOUNTER — Ambulatory Visit: Payer: Medicare HMO | Attending: Internal Medicine

## 2019-09-24 DIAGNOSIS — Z23 Encounter for immunization: Secondary | ICD-10-CM

## 2019-09-24 NOTE — Progress Notes (Signed)
   Covid-19 Vaccination Clinic  Name:  Anthony Hebert    MRN: [id number] DOB: 06-Jan-1951  09/24/2019  [name] was observed post Covid-19 immunization for 15 minutes without incidence. He was provided with Vaccine Information Sheet and instruction to access the V-Safe system.   [name] was instructed to call 911 with any severe reactions post vaccine: Marland Kitchen Difficulty breathing  . Swelling of your face and throat  . A fast heartbeat  . A bad rash all over your body  . Dizziness and weakness    Immunizations Administered    Name Date Dose VIS Date Route   Pfizer COVID-19 Vaccine 09/24/2019  8:08 AM 0.3 mL 07/25/2019 Intramuscular   Manufacturer: [place]   Lot: [id number]   [place]: [id number]

## 2019-09-25 DIAGNOSIS — M5416 Radiculopathy, lumbar region: Secondary | ICD-10-CM | POA: Diagnosis not present

## 2019-10-08 DIAGNOSIS — M5416 Radiculopathy, lumbar region: Secondary | ICD-10-CM | POA: Diagnosis not present

## 2019-10-09 DIAGNOSIS — M65322 Trigger finger, left index finger: Secondary | ICD-10-CM | POA: Diagnosis not present

## 2019-10-09 DIAGNOSIS — M65342 Trigger finger, left ring finger: Secondary | ICD-10-CM | POA: Diagnosis not present

## 2019-10-09 DIAGNOSIS — M7712 Lateral epicondylitis, left elbow: Secondary | ICD-10-CM | POA: Diagnosis not present

## 2019-10-09 DIAGNOSIS — M65352 Trigger finger, left little finger: Secondary | ICD-10-CM | POA: Diagnosis not present

## 2019-10-09 DIAGNOSIS — M65312 Trigger thumb, left thumb: Secondary | ICD-10-CM | POA: Diagnosis not present

## 2019-10-09 DIAGNOSIS — M65332 Trigger finger, left middle finger: Secondary | ICD-10-CM | POA: Diagnosis not present

## 2019-11-20 DIAGNOSIS — M65332 Trigger finger, left middle finger: Secondary | ICD-10-CM | POA: Diagnosis not present

## 2019-11-20 DIAGNOSIS — M79642 Pain in left hand: Secondary | ICD-10-CM | POA: Diagnosis not present

## 2019-11-20 DIAGNOSIS — M7712 Lateral epicondylitis, left elbow: Secondary | ICD-10-CM | POA: Diagnosis not present

## 2019-12-15 DIAGNOSIS — L820 Inflamed seborrheic keratosis: Secondary | ICD-10-CM | POA: Diagnosis not present

## 2019-12-15 DIAGNOSIS — D485 Neoplasm of uncertain behavior of skin: Secondary | ICD-10-CM | POA: Diagnosis not present

## 2019-12-15 DIAGNOSIS — L570 Actinic keratosis: Secondary | ICD-10-CM | POA: Diagnosis not present

## 2019-12-15 DIAGNOSIS — L989 Disorder of the skin and subcutaneous tissue, unspecified: Secondary | ICD-10-CM | POA: Diagnosis not present

## 2019-12-15 DIAGNOSIS — D225 Melanocytic nevi of trunk: Secondary | ICD-10-CM | POA: Diagnosis not present

## 2019-12-15 DIAGNOSIS — L905 Scar conditions and fibrosis of skin: Secondary | ICD-10-CM | POA: Diagnosis not present

## 2019-12-15 DIAGNOSIS — L578 Other skin changes due to chronic exposure to nonionizing radiation: Secondary | ICD-10-CM | POA: Diagnosis not present

## 2019-12-15 DIAGNOSIS — Z85820 Personal history of malignant melanoma of skin: Secondary | ICD-10-CM | POA: Diagnosis not present

## 2019-12-15 DIAGNOSIS — L218 Other seborrheic dermatitis: Secondary | ICD-10-CM | POA: Diagnosis not present

## 2019-12-15 DIAGNOSIS — L821 Other seborrheic keratosis: Secondary | ICD-10-CM | POA: Diagnosis not present

## 2019-12-15 DIAGNOSIS — D2262 Melanocytic nevi of left upper limb, including shoulder: Secondary | ICD-10-CM | POA: Diagnosis not present

## 2019-12-24 DIAGNOSIS — S63592D Other specified sprain of left wrist, subsequent encounter: Secondary | ICD-10-CM | POA: Diagnosis not present

## 2019-12-24 DIAGNOSIS — M25522 Pain in left elbow: Secondary | ICD-10-CM | POA: Diagnosis not present

## 2019-12-24 DIAGNOSIS — S63591D Other specified sprain of right wrist, subsequent encounter: Secondary | ICD-10-CM | POA: Diagnosis not present

## 2019-12-24 DIAGNOSIS — M7712 Lateral epicondylitis, left elbow: Secondary | ICD-10-CM | POA: Diagnosis not present

## 2020-01-07 DIAGNOSIS — H524 Presbyopia: Secondary | ICD-10-CM | POA: Diagnosis not present

## 2020-01-07 DIAGNOSIS — H25813 Combined forms of age-related cataract, bilateral: Secondary | ICD-10-CM | POA: Diagnosis not present

## 2020-01-23 DIAGNOSIS — S99911A Unspecified injury of right ankle, initial encounter: Secondary | ICD-10-CM | POA: Diagnosis not present

## 2020-01-23 DIAGNOSIS — S93401A Sprain of unspecified ligament of right ankle, initial encounter: Secondary | ICD-10-CM | POA: Diagnosis not present

## 2020-01-23 DIAGNOSIS — S99921A Unspecified injury of right foot, initial encounter: Secondary | ICD-10-CM | POA: Diagnosis not present

## 2020-01-27 DIAGNOSIS — D0339 Melanoma in situ of other parts of face: Secondary | ICD-10-CM | POA: Diagnosis not present

## 2020-01-27 DIAGNOSIS — L989 Disorder of the skin and subcutaneous tissue, unspecified: Secondary | ICD-10-CM | POA: Diagnosis not present

## 2020-02-03 DIAGNOSIS — M5136 Other intervertebral disc degeneration, lumbar region: Secondary | ICD-10-CM | POA: Diagnosis not present

## 2020-02-18 DIAGNOSIS — Z125 Encounter for screening for malignant neoplasm of prostate: Secondary | ICD-10-CM | POA: Diagnosis not present

## 2020-02-18 DIAGNOSIS — M109 Gout, unspecified: Secondary | ICD-10-CM | POA: Diagnosis not present

## 2020-02-18 DIAGNOSIS — I350 Nonrheumatic aortic (valve) stenosis: Secondary | ICD-10-CM | POA: Diagnosis not present

## 2020-02-18 DIAGNOSIS — D126 Benign neoplasm of colon, unspecified: Secondary | ICD-10-CM | POA: Diagnosis not present

## 2020-02-18 DIAGNOSIS — I10 Essential (primary) hypertension: Secondary | ICD-10-CM | POA: Diagnosis not present

## 2020-02-18 DIAGNOSIS — M5136 Other intervertebral disc degeneration, lumbar region: Secondary | ICD-10-CM | POA: Diagnosis not present

## 2020-02-18 DIAGNOSIS — Z0000 Encounter for general adult medical examination without abnormal findings: Secondary | ICD-10-CM | POA: Diagnosis not present

## 2020-02-18 DIAGNOSIS — E785 Hyperlipidemia, unspecified: Secondary | ICD-10-CM | POA: Diagnosis not present

## 2020-02-18 DIAGNOSIS — Z85820 Personal history of malignant melanoma of skin: Secondary | ICD-10-CM | POA: Diagnosis not present

## 2020-02-19 DIAGNOSIS — M5416 Radiculopathy, lumbar region: Secondary | ICD-10-CM | POA: Diagnosis not present

## 2020-02-24 ENCOUNTER — Other Ambulatory Visit: Payer: Self-pay | Admitting: Neurosurgery

## 2020-02-24 DIAGNOSIS — M5416 Radiculopathy, lumbar region: Secondary | ICD-10-CM

## 2020-03-03 DIAGNOSIS — S63592D Other specified sprain of left wrist, subsequent encounter: Secondary | ICD-10-CM | POA: Diagnosis not present

## 2020-03-03 DIAGNOSIS — S63591D Other specified sprain of right wrist, subsequent encounter: Secondary | ICD-10-CM | POA: Diagnosis not present

## 2020-03-03 DIAGNOSIS — M7712 Lateral epicondylitis, left elbow: Secondary | ICD-10-CM | POA: Diagnosis not present

## 2020-03-03 DIAGNOSIS — M25532 Pain in left wrist: Secondary | ICD-10-CM | POA: Diagnosis not present

## 2020-03-05 DIAGNOSIS — S93401A Sprain of unspecified ligament of right ankle, initial encounter: Secondary | ICD-10-CM | POA: Diagnosis not present

## 2020-03-19 DIAGNOSIS — M25571 Pain in right ankle and joints of right foot: Secondary | ICD-10-CM | POA: Diagnosis not present

## 2020-03-21 ENCOUNTER — Other Ambulatory Visit: Payer: Self-pay

## 2020-03-21 ENCOUNTER — Ambulatory Visit
Admission: RE | Admit: 2020-03-21 | Discharge: 2020-03-21 | Disposition: A | Discharge status: Home / Self Care | Payer: Medicare HMO | Source: Ambulatory Visit | Attending: Neurosurgery | Admitting: Neurosurgery

## 2020-03-21 DIAGNOSIS — M5416 Radiculopathy, lumbar region: Secondary | ICD-10-CM

## 2020-03-21 DIAGNOSIS — M5127 Other intervertebral disc displacement, lumbosacral region: Secondary | ICD-10-CM | POA: Diagnosis not present

## 2020-03-21 DIAGNOSIS — M48061 Spinal stenosis, lumbar region without neurogenic claudication: Secondary | ICD-10-CM | POA: Diagnosis not present

## 2020-03-21 MED ORDER — GADOBENATE DIMEGLUMINE 529 MG/ML IV SOLN
20.0000 mL | Freq: Once | INTRAVENOUS | Status: AC | PRN
  Administered 2020-03-21: 20 mL via INTRAVENOUS

## 2020-03-24 DIAGNOSIS — M5416 Radiculopathy, lumbar region: Secondary | ICD-10-CM | POA: Diagnosis not present

## 2020-06-02 DIAGNOSIS — L821 Other seborrheic keratosis: Secondary | ICD-10-CM | POA: Diagnosis not present

## 2020-06-02 DIAGNOSIS — Z85820 Personal history of malignant melanoma of skin: Secondary | ICD-10-CM | POA: Diagnosis not present

## 2020-06-02 DIAGNOSIS — L820 Inflamed seborrheic keratosis: Secondary | ICD-10-CM | POA: Diagnosis not present

## 2020-06-02 DIAGNOSIS — D225 Melanocytic nevi of trunk: Secondary | ICD-10-CM | POA: Diagnosis not present

## 2020-06-02 DIAGNOSIS — L570 Actinic keratosis: Secondary | ICD-10-CM | POA: Diagnosis not present

## 2020-06-02 DIAGNOSIS — D485 Neoplasm of uncertain behavior of skin: Secondary | ICD-10-CM | POA: Diagnosis not present

## 2020-06-02 DIAGNOSIS — L905 Scar conditions and fibrosis of skin: Secondary | ICD-10-CM | POA: Diagnosis not present

## 2020-06-02 DIAGNOSIS — Z85828 Personal history of other malignant neoplasm of skin: Secondary | ICD-10-CM | POA: Diagnosis not present

## 2020-06-10 DIAGNOSIS — I10 Essential (primary) hypertension: Secondary | ICD-10-CM | POA: Diagnosis not present

## 2020-06-10 DIAGNOSIS — M150 Primary generalized (osteo)arthritis: Secondary | ICD-10-CM | POA: Diagnosis not present

## 2020-06-10 DIAGNOSIS — E7800 Pure hypercholesterolemia, unspecified: Secondary | ICD-10-CM | POA: Diagnosis not present

## 2020-06-10 DIAGNOSIS — E785 Hyperlipidemia, unspecified: Secondary | ICD-10-CM | POA: Diagnosis not present

## 2020-07-07 DIAGNOSIS — M7712 Lateral epicondylitis, left elbow: Secondary | ICD-10-CM | POA: Diagnosis not present

## 2020-07-07 DIAGNOSIS — M79642 Pain in left hand: Secondary | ICD-10-CM | POA: Diagnosis not present

## 2020-07-07 DIAGNOSIS — M79641 Pain in right hand: Secondary | ICD-10-CM | POA: Diagnosis not present

## 2020-09-02 DIAGNOSIS — Z85820 Personal history of malignant melanoma of skin: Secondary | ICD-10-CM | POA: Diagnosis not present

## 2020-09-02 DIAGNOSIS — D225 Melanocytic nevi of trunk: Secondary | ICD-10-CM | POA: Diagnosis not present

## 2020-09-02 DIAGNOSIS — L578 Other skin changes due to chronic exposure to nonionizing radiation: Secondary | ICD-10-CM | POA: Diagnosis not present

## 2020-09-02 DIAGNOSIS — L820 Inflamed seborrheic keratosis: Secondary | ICD-10-CM | POA: Diagnosis not present

## 2020-09-02 DIAGNOSIS — Z85828 Personal history of other malignant neoplasm of skin: Secondary | ICD-10-CM | POA: Diagnosis not present

## 2020-09-02 DIAGNOSIS — D229 Melanocytic nevi, unspecified: Secondary | ICD-10-CM | POA: Diagnosis not present

## 2020-09-02 DIAGNOSIS — D044 Carcinoma in situ of skin of scalp and neck: Secondary | ICD-10-CM | POA: Diagnosis not present

## 2020-09-02 DIAGNOSIS — C44622 Squamous cell carcinoma of skin of right upper limb, including shoulder: Secondary | ICD-10-CM | POA: Diagnosis not present

## 2020-09-02 DIAGNOSIS — L821 Other seborrheic keratosis: Secondary | ICD-10-CM | POA: Diagnosis not present

## 2020-09-02 DIAGNOSIS — L570 Actinic keratosis: Secondary | ICD-10-CM | POA: Diagnosis not present

## 2020-09-02 DIAGNOSIS — L905 Scar conditions and fibrosis of skin: Secondary | ICD-10-CM | POA: Diagnosis not present

## 2020-09-02 DIAGNOSIS — D485 Neoplasm of uncertain behavior of skin: Secondary | ICD-10-CM | POA: Diagnosis not present

## 2020-09-16 DIAGNOSIS — Z4789 Encounter for other orthopedic aftercare: Secondary | ICD-10-CM | POA: Diagnosis not present

## 2020-09-16 DIAGNOSIS — M7712 Lateral epicondylitis, left elbow: Secondary | ICD-10-CM | POA: Diagnosis not present

## 2020-09-28 DIAGNOSIS — D0461 Carcinoma in situ of skin of right upper limb, including shoulder: Secondary | ICD-10-CM | POA: Diagnosis not present

## 2020-10-05 DIAGNOSIS — D044 Carcinoma in situ of skin of scalp and neck: Secondary | ICD-10-CM | POA: Diagnosis not present

## 2020-11-06 DIAGNOSIS — E7800 Pure hypercholesterolemia, unspecified: Secondary | ICD-10-CM | POA: Diagnosis not present

## 2020-11-06 DIAGNOSIS — I10 Essential (primary) hypertension: Secondary | ICD-10-CM | POA: Diagnosis not present

## 2020-11-06 DIAGNOSIS — G8929 Other chronic pain: Secondary | ICD-10-CM | POA: Diagnosis not present

## 2020-11-06 DIAGNOSIS — M150 Primary generalized (osteo)arthritis: Secondary | ICD-10-CM | POA: Diagnosis not present

## 2020-11-06 DIAGNOSIS — E785 Hyperlipidemia, unspecified: Secondary | ICD-10-CM | POA: Diagnosis not present

## 2020-11-11 DIAGNOSIS — M5416 Radiculopathy, lumbar region: Secondary | ICD-10-CM | POA: Diagnosis not present

## 2020-11-24 DIAGNOSIS — H1031 Unspecified acute conjunctivitis, right eye: Secondary | ICD-10-CM | POA: Diagnosis not present

## 2020-12-01 DIAGNOSIS — Z85820 Personal history of malignant melanoma of skin: Secondary | ICD-10-CM | POA: Diagnosis not present

## 2020-12-01 DIAGNOSIS — L570 Actinic keratosis: Secondary | ICD-10-CM | POA: Diagnosis not present

## 2020-12-01 DIAGNOSIS — D225 Melanocytic nevi of trunk: Secondary | ICD-10-CM | POA: Diagnosis not present

## 2020-12-01 DIAGNOSIS — L814 Other melanin hyperpigmentation: Secondary | ICD-10-CM | POA: Diagnosis not present

## 2020-12-01 DIAGNOSIS — Z85828 Personal history of other malignant neoplasm of skin: Secondary | ICD-10-CM | POA: Diagnosis not present

## 2020-12-01 DIAGNOSIS — D485 Neoplasm of uncertain behavior of skin: Secondary | ICD-10-CM | POA: Diagnosis not present

## 2020-12-01 DIAGNOSIS — L820 Inflamed seborrheic keratosis: Secondary | ICD-10-CM | POA: Diagnosis not present

## 2020-12-01 DIAGNOSIS — L905 Scar conditions and fibrosis of skin: Secondary | ICD-10-CM | POA: Diagnosis not present

## 2020-12-01 DIAGNOSIS — L821 Other seborrheic keratosis: Secondary | ICD-10-CM | POA: Diagnosis not present

## 2021-01-12 DIAGNOSIS — H25813 Combined forms of age-related cataract, bilateral: Secondary | ICD-10-CM | POA: Diagnosis not present

## 2021-01-12 DIAGNOSIS — H524 Presbyopia: Secondary | ICD-10-CM | POA: Diagnosis not present

## 2021-01-31 DIAGNOSIS — S2096XA Insect bite (nonvenomous) of unspecified parts of thorax, initial encounter: Secondary | ICD-10-CM | POA: Diagnosis not present

## 2021-01-31 DIAGNOSIS — Z85828 Personal history of other malignant neoplasm of skin: Secondary | ICD-10-CM | POA: Diagnosis not present

## 2021-01-31 DIAGNOSIS — Z85820 Personal history of malignant melanoma of skin: Secondary | ICD-10-CM | POA: Diagnosis not present

## 2021-01-31 DIAGNOSIS — D485 Neoplasm of uncertain behavior of skin: Secondary | ICD-10-CM | POA: Diagnosis not present

## 2021-01-31 DIAGNOSIS — L905 Scar conditions and fibrosis of skin: Secondary | ICD-10-CM | POA: Diagnosis not present

## 2021-01-31 DIAGNOSIS — L308 Other specified dermatitis: Secondary | ICD-10-CM | POA: Diagnosis not present

## 2021-01-31 DIAGNOSIS — D225 Melanocytic nevi of trunk: Secondary | ICD-10-CM | POA: Diagnosis not present

## 2021-01-31 DIAGNOSIS — L570 Actinic keratosis: Secondary | ICD-10-CM | POA: Diagnosis not present

## 2021-02-07 DIAGNOSIS — M7712 Lateral epicondylitis, left elbow: Secondary | ICD-10-CM | POA: Diagnosis not present

## 2021-02-07 DIAGNOSIS — M79621 Pain in right upper arm: Secondary | ICD-10-CM | POA: Diagnosis not present

## 2021-02-08 DIAGNOSIS — G8929 Other chronic pain: Secondary | ICD-10-CM | POA: Diagnosis not present

## 2021-02-08 DIAGNOSIS — E7800 Pure hypercholesterolemia, unspecified: Secondary | ICD-10-CM | POA: Diagnosis not present

## 2021-02-08 DIAGNOSIS — E785 Hyperlipidemia, unspecified: Secondary | ICD-10-CM | POA: Diagnosis not present

## 2021-02-08 DIAGNOSIS — I10 Essential (primary) hypertension: Secondary | ICD-10-CM | POA: Diagnosis not present

## 2021-02-08 DIAGNOSIS — M150 Primary generalized (osteo)arthritis: Secondary | ICD-10-CM | POA: Diagnosis not present

## 2021-02-24 DIAGNOSIS — H2513 Age-related nuclear cataract, bilateral: Secondary | ICD-10-CM | POA: Diagnosis not present

## 2021-02-24 DIAGNOSIS — H18413 Arcus senilis, bilateral: Secondary | ICD-10-CM | POA: Diagnosis not present

## 2021-02-24 DIAGNOSIS — H25043 Posterior subcapsular polar age-related cataract, bilateral: Secondary | ICD-10-CM | POA: Diagnosis not present

## 2021-02-24 DIAGNOSIS — H25013 Cortical age-related cataract, bilateral: Secondary | ICD-10-CM | POA: Diagnosis not present

## 2021-02-24 DIAGNOSIS — H2512 Age-related nuclear cataract, left eye: Secondary | ICD-10-CM | POA: Diagnosis not present

## 2021-03-04 DIAGNOSIS — Z0000 Encounter for general adult medical examination without abnormal findings: Secondary | ICD-10-CM | POA: Diagnosis not present

## 2021-03-04 DIAGNOSIS — D126 Benign neoplasm of colon, unspecified: Secondary | ICD-10-CM | POA: Diagnosis not present

## 2021-03-04 DIAGNOSIS — M109 Gout, unspecified: Secondary | ICD-10-CM | POA: Diagnosis not present

## 2021-03-04 DIAGNOSIS — Z125 Encounter for screening for malignant neoplasm of prostate: Secondary | ICD-10-CM | POA: Diagnosis not present

## 2021-03-04 DIAGNOSIS — M150 Primary generalized (osteo)arthritis: Secondary | ICD-10-CM | POA: Diagnosis not present

## 2021-03-04 DIAGNOSIS — I358 Other nonrheumatic aortic valve disorders: Secondary | ICD-10-CM | POA: Diagnosis not present

## 2021-03-04 DIAGNOSIS — M5136 Other intervertebral disc degeneration, lumbar region: Secondary | ICD-10-CM | POA: Diagnosis not present

## 2021-03-04 DIAGNOSIS — I10 Essential (primary) hypertension: Secondary | ICD-10-CM | POA: Diagnosis not present

## 2021-03-04 DIAGNOSIS — E785 Hyperlipidemia, unspecified: Secondary | ICD-10-CM | POA: Diagnosis not present

## 2021-03-15 DIAGNOSIS — Z20822 Contact with and (suspected) exposure to covid-19: Secondary | ICD-10-CM | POA: Diagnosis not present

## 2021-03-15 DIAGNOSIS — R509 Fever, unspecified: Secondary | ICD-10-CM | POA: Diagnosis not present

## 2021-04-04 DIAGNOSIS — H2512 Age-related nuclear cataract, left eye: Secondary | ICD-10-CM | POA: Diagnosis not present

## 2021-04-05 DIAGNOSIS — H2511 Age-related nuclear cataract, right eye: Secondary | ICD-10-CM | POA: Diagnosis not present

## 2021-04-13 DIAGNOSIS — I10 Essential (primary) hypertension: Secondary | ICD-10-CM | POA: Diagnosis not present

## 2021-04-13 DIAGNOSIS — E785 Hyperlipidemia, unspecified: Secondary | ICD-10-CM | POA: Diagnosis not present

## 2021-04-13 DIAGNOSIS — G8929 Other chronic pain: Secondary | ICD-10-CM | POA: Diagnosis not present

## 2021-04-13 DIAGNOSIS — E7800 Pure hypercholesterolemia, unspecified: Secondary | ICD-10-CM | POA: Diagnosis not present

## 2021-04-13 DIAGNOSIS — M150 Primary generalized (osteo)arthritis: Secondary | ICD-10-CM | POA: Diagnosis not present

## 2021-04-27 DIAGNOSIS — H2511 Age-related nuclear cataract, right eye: Secondary | ICD-10-CM | POA: Diagnosis not present

## 2021-05-12 DIAGNOSIS — I10 Essential (primary) hypertension: Secondary | ICD-10-CM | POA: Diagnosis not present

## 2021-05-12 DIAGNOSIS — Z6832 Body mass index (BMI) 32.0-32.9, adult: Secondary | ICD-10-CM | POA: Diagnosis not present

## 2021-05-12 DIAGNOSIS — M5416 Radiculopathy, lumbar region: Secondary | ICD-10-CM | POA: Diagnosis not present

## 2021-06-05 NOTE — Progress Notes (Signed)
Cardiology Office Note   Date:  06/06/2021   ID:  Anthony, Hebert [birth year], MRN [id number]  PCP:  Anthony, Hebert    No chief complaint on file.  Aortic sclerosis  Wt Readings from Last 3 Encounters:  06/06/21 201 lb 12.8 oz (91.5 kg)  03/22/17 200 lb (90.7 kg)  03/19/17 201 lb (91.2 kg)       History of Present Illness: Anthony Hebert is a 70 y.o. male who is being seen today for the evaluation of aortic sclerosis at the request of Aura Dials, MD.   "He has a h/o MI about 25 years ago after back surgery. ECG showed the MI but he never had any other heart tests done at that time. He does not recall having chest pain. He has been on meds for HTN and high cholesterol, for at least 15 years. "  "Had low back stimulator removed in 2/14. No cardiac issues.  Tolerated neck surgery in 2/15 for pinched nerve. "  2019 echo showed: "Left ventricle: The cavity size was normal. Wall thickness was    normal. Systolic function was normal. The estimated ejection    fraction was in the range of 55% to 60%. Wall motion was normal;    there were no regional wall motion abnormalities.  - Aortic valve: There was mild stenosis. Valve area (VTI): 1.7    cm^2. Valve area (Vmax): 1.67 cm^2. Valve area (Vmean): 1.78    cm^2.  - Pulmonary arteries: PA peak pressure: 31 mm Hg (S).   Impressions:   - Technically difficult; definity used; normal LV systolic    function; probable mild diastolic dysfunction; mild AS with mean    gradient 10 mmHg); mild TR. "  Denies : Chest pain. Dizziness. Leg edema. Nitroglycerin use. Orthopnea. Palpitations. Paroxysmal nocturnal dyspnea. Shortness of breath. Syncope.    Had COVID in August 2022.   Back to the gym- weights and bike.  Goes 3-4 x/week.  Recent eye surgery.  Past Medical History:  Diagnosis Date   Acute idiopathic gout of right ankle    Aortic valve sclerosis    Arthritis    "all over"   Chronic back pain     "all over"   Complication of anesthesia    slow to wake up at times   DDD (degenerative disc disease), lumbar    Difficult intubation    had trouble with intubation shape of throat;last sx no problems per pt; 03/28/16 successful oral ETT with green Glidescope, stylet. Has limited oral opening/small glottic opening   Foot swelling    in [place] 07/2013   GERD (gastroesophageal reflux disease)    occ   Gout    Gout of left foot    Heart murmur    states no problems   History of melanoma    Hypercholesterolemia    Hyperlipidemia    Hypertension    Melanoma ([place]) ~ 2010   "above left eye"   Nonrheumatic aortic valve stenosis    Other chronic pain    Primary osteoarthritis involving multiple joints    Rising PSA level    Screening for prostate cancer    Stroke [facility])    "maybe w/my 1st back OR in the 1990's"; denies residual on 07/15/2015    Past Surgical History:  Procedure Laterality Date   APPENDECTOMY     BACK SURGERY     CARPAL TUNNEL RELEASE Right 07/15/2015   w/GUYON'S CANAL RELEASE  CARPAL TUNNEL RELEASE Right 07/15/2015   Procedure: RIGHT FOREARM AND WRIST CARPAL TUNNEL RELEASE;  Surgeon: Roseanne Kaufman, MD;  Location: [place];  Service: Orthopedics;  Laterality: Right;   CARPAL TUNNEL RELEASE Left 03/22/2017   Procedure: OPEN CARPAL TUNNEL RELEASE;  Surgeon: Roseanne Kaufman, MD;  Location: [place];  Service: Orthopedics;  Laterality: Left;  60 mins   COLONOSCOPY     FOOT SURGERY Right    cut nerve to relieve pain   INGUINAL HERNIA REPAIR Left 1994   INSERTION OF MESH N/A 03/28/2016   Procedure: INSERTION OF MESH;  Surgeon: Rolm Bookbinder, MD;  Location: [place];  Service: General;  Laterality: N/A;   MELANOMA EXCISION     POSTERIOR CERVICAL LAMINECTOMY Right 10/07/2013   Procedure: POSTERIOR CERVICAL LAMINECTOMY RIGHT CERVICAL SEVEN -THORACIC ONE FORAMINOTOMY;  Surgeon: Faythe Ghee, MD;  Location: [facility];  Service: Neurosurgery;  Laterality: Right;   POSTERIOR  LUMBAR FUSION  X 7   SPINAL CORD STIMULATOR IMPLANT  ~ 2012   SPINAL CORD STIMULATOR REMOVAL N/A 09/19/2012   Procedure: LUMBAR SPINAL CORD STIMULATOR REMOVAL;  Surgeon: Faythe Ghee, MD;  Location: [place] [facility];  Service: Neurosurgery;  Laterality: N/A;   TONSILLECTOMY     ULNAR NERVE TRANSPOSITION Right 07/15/2015   supercharged AIN to deep motor branch ulnar nerve nerve transfer   UMBILICAL HERNIA REPAIR N/A 03/28/2016   Procedure: LAPAROSCOPIC UMBILICAL HERNIA;  Surgeon: Rolm Bookbinder, MD;  Location: [place] OR;  Service: General;  Laterality: N/A;     Current Outpatient Medications  Medication Sig Dispense Refill   allopurinol (ZYLOPRIM) 100 MG tablet Take 100 mg by mouth daily.     HYDROcodone-acetaminophen (NORCO/VICODIN) 5-325 MG tablet Take 1 tablet by mouth every 6 (six) hours as needed for moderate pain.     lisinopril-hydrochlorothiazide (PRINZIDE,ZESTORETIC) 20-12.5 MG tablet Take 1 tablet by mouth daily.     naproxen (NAPROSYN) 500 MG tablet Take 500 mg by mouth 2 (two) times daily with a meal.     simvastatin (ZOCOR) 40 MG tablet Take 40 mg by mouth every evening.     COLCRYS 0.6 MG tablet Take 0.6 mg by mouth every 8 (eight) hours as needed (gout).  (Patient not taking: Reported on 06/06/2021)  0   famotidine (PEPCID) 10 MG tablet Take 10 mg by mouth daily as needed for heartburn or indigestion.  (Patient not taking: Reported on 06/06/2021)     methocarbamol (ROBAXIN) 500 MG tablet Take 500 mg by mouth 4 (four) times daily. (Patient not taking: Reported on 06/06/2021)     nabumetone (RELAFEN) 500 MG tablet Take 500 mg by mouth daily as needed (muscle relaxation). (Patient not taking: Reported on 06/06/2021)     TURMERIC PO Take 1 tablet by mouth daily. (Patient not taking: Reported on 06/06/2021)     No current facility-administered medications for this visit.    Allergies:   No known allergies    Social History:  The patient  reports that he has never smoked. He has  never used smokeless tobacco. He reports current alcohol use of about 1.0 - 2.0 standard drink per week. He reports that he does not use drugs.   Family History:  The patient's family history includes Alzheimer's disease in his father; Cancer in his mother.    ROS:  Please see the history of present illness.   Otherwise, review of systems are positive for back pain.   All other systems are reviewed and negative.  PHYSICAL EXAM: VS:  BP 124/74   Pulse 71   Ht 5\' 6"  (1.676 m)   Wt 201 lb 12.8 oz (91.5 kg)   SpO2 95%   BMI 32.57 kg/m  , BMI Body mass index is 32.57 kg/m. GEN: Well nourished, well developed, in no acute distress HEENT: normal Neck: no JVD, carotid bruits, or masses Cardiac: RRR; no murmurs, rubs, or gallops,no edema  Respiratory:  clear to auscultation bilaterally, normal work of breathing GI: soft, nontender, nondistended, + BS MS: no deformity or atrophy Skin: warm and dry, no rash Neuro:  Strength and sensation are intact Psych: euthymic mood, full affect   EKG:   The ekg ordered today demonstrates NSR, IRBBB   Recent Labs: No results found for requested labs within last 8760 hours.   Lipid Panel No results found for: CHOL, TRIG, HDL, CHOLHDL, VLDL, LDLCALC, LDLDIRECT   Other studies Reviewed: Additional studies/ records that were reviewed today with results demonstrating: 2019 echo reviewed.   ASSESSMENT AND PLAN:  Aortic sclerosis: mild Aortic stenosis in 2019.  Repeat echocardiogram.  Murmur has increased in intensity.  No symptoms of severe aortic stenosis at this point.  No lightheadedness or angina.  He exercises regularly. HTN: The current medical regimen is effective;  continue present plan and medications.  Continue ACE inhibitor.  This has been shown to slow the progression of aortic stenosis. Hyperlipidemia: Controlled on simvastatin. Given old MI, will switch to rosuvastatin 20 mg daily.  Statin therapy has also been shown to slow the  progression of aortic stenosis.  Check liver and lipids in 3 months.   Current medicines are reviewed at length with the patient today.  The patient concerns regarding his medicines were addressed.  The following changes have been made:  No change  Labs/ tests ordered today include:  No orders of the defined types were placed in this encounter.   Recommend 150 minutes/week of aerobic exercise Low fat, low carb, high fiber diet recommended  Disposition:   FU in 1 year   Signed, Larae Grooms, MD  06/06/2021 8:20 AM    [facility] [address], [place], [place]  [address] Phone: [phone]; Fax: [phone]

## 2021-06-06 ENCOUNTER — Encounter: Payer: Self-pay | Admitting: Interventional Cardiology

## 2021-06-06 ENCOUNTER — Other Ambulatory Visit: Payer: Self-pay

## 2021-06-06 ENCOUNTER — Ambulatory Visit: Payer: Medicare HMO | Admitting: Interventional Cardiology

## 2021-06-06 VITALS — BP 124/74 | HR 71 | Ht 66.0 in | Wt 201.8 lb

## 2021-06-06 DIAGNOSIS — I350 Nonrheumatic aortic (valve) stenosis: Secondary | ICD-10-CM | POA: Diagnosis not present

## 2021-06-06 DIAGNOSIS — E782 Mixed hyperlipidemia: Secondary | ICD-10-CM | POA: Diagnosis not present

## 2021-06-06 DIAGNOSIS — I10 Essential (primary) hypertension: Secondary | ICD-10-CM | POA: Diagnosis not present

## 2021-06-06 MED ORDER — ROSUVASTATIN CALCIUM 20 MG PO TABS: 20.0000 mg | ORAL_TABLET | Freq: Every day | ORAL | 3 refills | Status: DC

## 2021-06-06 NOTE — Patient Instructions (Signed)
Medication Instructions:  Your physician has recommended you make the following change in your medication:  STOP: simvastatin START: rosuvastatin (Crestor) 20 mg by mouth daily  *If you need a refill on your cardiac medications before your next appointment, please call your pharmacy*   Lab Work: IN 3 MONTHS: Fasting lipid panel and liver function test If you have labs (blood work) drawn today and your tests are completely normal, you will receive your results only by: [place] (if you have MyChart) OR A paper copy in the mail If you have any lab test that is abnormal or we need to change your treatment, we will call you to review the results.   Testing/Procedures: Your physician has requested that you have an echocardiogram. Echocardiography is a painless test that uses sound waves to create images of your heart. It provides your doctor with information about the size and shape of your heart and how well your heart's chambers and valves are working. This procedure takes approximately one hour. There are no restrictions for this procedure.    Follow-Up: At [facility], you and your health needs are our priority.  As part of our continuing mission to provide you with exceptional heart care, we have created designated Provider Care Teams.  These Care Teams include your primary Cardiologist (physician) and Advanced Practice Providers (APPs -  Physician Assistants and Nurse Practitioners) who all work together to provide you with the care you need, when you need it.  We recommend signing up for the patient portal called "MyChart".  Sign up information is provided on this After Visit Summary.  MyChart is used to connect with patients for Virtual Visits (Telemedicine).  Patients are able to view lab/test results, encounter notes, upcoming appointments, etc.  Non-urgent messages can be sent to your provider as well.   To learn more about what you can do with MyChart, go to  [email or web address].    Your next appointment:   1 year(s)  The format for your next appointment:   In Person  Provider:   You may see Larae Grooms, MD or one of the following Advanced Practice Providers on your designated Care Team:   Melina Copa, PA-C Ermalinda Barrios, PA-C

## 2021-06-23 ENCOUNTER — Ambulatory Visit (HOSPITAL_COMMUNITY): Payer: Medicare HMO | Attending: Cardiology

## 2021-06-23 ENCOUNTER — Other Ambulatory Visit: Payer: Self-pay

## 2021-06-23 ENCOUNTER — Telehealth: Payer: Self-pay | Admitting: Interventional Cardiology

## 2021-06-23 DIAGNOSIS — I10 Essential (primary) hypertension: Secondary | ICD-10-CM | POA: Diagnosis not present

## 2021-06-23 DIAGNOSIS — I350 Nonrheumatic aortic (valve) stenosis: Secondary | ICD-10-CM | POA: Diagnosis not present

## 2021-06-23 LAB — ECHOCARDIOGRAM COMPLETE
AR max vel: 1.39 cm2
AV Area VTI: 1.32 cm2
AV Area mean vel: 1.29 cm2
AV Mean grad: 12 mmHg
AV Peak grad: 23.4 mmHg
Ao pk vel: 2.42 m/s
Area-P 1/2: 2.2 cm2
S' Lateral: 3 cm

## 2021-06-23 MED ORDER — PERFLUTREN LIPID MICROSPHERE
1.0000 mL | INTRAVENOUS | Status: AC | PRN
  Administered 2021-06-23: 1 mL via INTRAVENOUS

## 2021-06-23 NOTE — Telephone Encounter (Signed)
Echo results reviewed with patient.

## 2021-06-23 NOTE — Telephone Encounter (Signed)
Patient is returning call to discuss echo results. °

## 2021-08-02 DIAGNOSIS — D225 Melanocytic nevi of trunk: Secondary | ICD-10-CM | POA: Diagnosis not present

## 2021-08-02 DIAGNOSIS — Z86006 Personal history of melanoma in-situ: Secondary | ICD-10-CM | POA: Diagnosis not present

## 2021-08-02 DIAGNOSIS — D2271 Melanocytic nevi of right lower limb, including hip: Secondary | ICD-10-CM | POA: Diagnosis not present

## 2021-08-02 DIAGNOSIS — D485 Neoplasm of uncertain behavior of skin: Secondary | ICD-10-CM | POA: Diagnosis not present

## 2021-08-02 DIAGNOSIS — L821 Other seborrheic keratosis: Secondary | ICD-10-CM | POA: Diagnosis not present

## 2021-08-02 DIAGNOSIS — D692 Other nonthrombocytopenic purpura: Secondary | ICD-10-CM | POA: Diagnosis not present

## 2021-08-02 DIAGNOSIS — L814 Other melanin hyperpigmentation: Secondary | ICD-10-CM | POA: Diagnosis not present

## 2021-08-02 DIAGNOSIS — L570 Actinic keratosis: Secondary | ICD-10-CM | POA: Diagnosis not present

## 2021-08-02 DIAGNOSIS — D2261 Melanocytic nevi of right upper limb, including shoulder: Secondary | ICD-10-CM | POA: Diagnosis not present

## 2021-08-12 DIAGNOSIS — H5213 Myopia, bilateral: Secondary | ICD-10-CM | POA: Diagnosis not present

## 2021-08-17 DIAGNOSIS — M7061 Trochanteric bursitis, right hip: Secondary | ICD-10-CM | POA: Diagnosis not present

## 2021-08-22 DIAGNOSIS — H18413 Arcus senilis, bilateral: Secondary | ICD-10-CM | POA: Diagnosis not present

## 2021-08-22 DIAGNOSIS — Z961 Presence of intraocular lens: Secondary | ICD-10-CM | POA: Diagnosis not present

## 2021-08-22 DIAGNOSIS — H04123 Dry eye syndrome of bilateral lacrimal glands: Secondary | ICD-10-CM | POA: Diagnosis not present

## 2021-08-22 DIAGNOSIS — H43811 Vitreous degeneration, right eye: Secondary | ICD-10-CM | POA: Diagnosis not present

## 2021-08-31 DIAGNOSIS — M222X1 Patellofemoral disorders, right knee: Secondary | ICD-10-CM | POA: Diagnosis not present

## 2021-09-06 DIAGNOSIS — M25561 Pain in right knee: Secondary | ICD-10-CM | POA: Diagnosis not present

## 2021-09-06 DIAGNOSIS — M25651 Stiffness of right hip, not elsewhere classified: Secondary | ICD-10-CM | POA: Diagnosis not present

## 2021-09-06 DIAGNOSIS — R262 Difficulty in walking, not elsewhere classified: Secondary | ICD-10-CM | POA: Diagnosis not present

## 2021-09-13 DIAGNOSIS — R262 Difficulty in walking, not elsewhere classified: Secondary | ICD-10-CM | POA: Diagnosis not present

## 2021-09-13 DIAGNOSIS — M25651 Stiffness of right hip, not elsewhere classified: Secondary | ICD-10-CM | POA: Diagnosis not present

## 2021-09-13 DIAGNOSIS — M25561 Pain in right knee: Secondary | ICD-10-CM | POA: Diagnosis not present

## 2021-09-14 DIAGNOSIS — Z6831 Body mass index (BMI) 31.0-31.9, adult: Secondary | ICD-10-CM | POA: Diagnosis not present

## 2021-09-14 DIAGNOSIS — M25551 Pain in right hip: Secondary | ICD-10-CM | POA: Diagnosis not present

## 2021-09-14 DIAGNOSIS — M5416 Radiculopathy, lumbar region: Secondary | ICD-10-CM | POA: Diagnosis not present

## 2021-09-14 DIAGNOSIS — I10 Essential (primary) hypertension: Secondary | ICD-10-CM | POA: Diagnosis not present

## 2021-09-16 DIAGNOSIS — M25561 Pain in right knee: Secondary | ICD-10-CM | POA: Diagnosis not present

## 2021-09-16 DIAGNOSIS — M25651 Stiffness of right hip, not elsewhere classified: Secondary | ICD-10-CM | POA: Diagnosis not present

## 2021-09-16 DIAGNOSIS — R262 Difficulty in walking, not elsewhere classified: Secondary | ICD-10-CM | POA: Diagnosis not present

## 2021-09-20 ENCOUNTER — Other Ambulatory Visit: Payer: Self-pay

## 2021-09-20 ENCOUNTER — Other Ambulatory Visit: Payer: Medicare HMO | Admitting: *Deleted

## 2021-09-20 DIAGNOSIS — M25651 Stiffness of right hip, not elsewhere classified: Secondary | ICD-10-CM | POA: Diagnosis not present

## 2021-09-20 DIAGNOSIS — R262 Difficulty in walking, not elsewhere classified: Secondary | ICD-10-CM | POA: Diagnosis not present

## 2021-09-20 DIAGNOSIS — M25561 Pain in right knee: Secondary | ICD-10-CM | POA: Diagnosis not present

## 2021-09-20 DIAGNOSIS — E782 Mixed hyperlipidemia: Secondary | ICD-10-CM | POA: Diagnosis not present

## 2021-09-20 LAB — LIPID PANEL
Chol/HDL Ratio: 2.3 ratio (ref 0.0–5.0)
Cholesterol, Total: 148 mg/dL (ref 100–199)
HDL: 65 mg/dL (ref >39)
LDL Chol Calc (NIH): 70 mg/dL (ref 0–99)
Triglycerides: 61 mg/dL (ref 0–149)
VLDL Cholesterol Cal: 13 mg/dL (ref 5–40)

## 2021-09-20 LAB — HEPATIC FUNCTION PANEL
ALT: 41 IU/L (ref 0–44)
AST: 29 IU/L (ref 0–40)
Albumin: 4.8 g/dL (ref 3.8–4.8)
Alkaline Phosphatase: 102 IU/L (ref 44–121)
Bilirubin Total: 0.9 mg/dL (ref 0.0–1.2)
Bilirubin, Direct: 0.26 mg/dL (ref 0.00–0.40)
Total Protein: 7 g/dL (ref 6.0–8.5)

## 2021-09-22 DIAGNOSIS — M25651 Stiffness of right hip, not elsewhere classified: Secondary | ICD-10-CM | POA: Diagnosis not present

## 2021-09-22 DIAGNOSIS — R262 Difficulty in walking, not elsewhere classified: Secondary | ICD-10-CM | POA: Diagnosis not present

## 2021-09-22 DIAGNOSIS — M25561 Pain in right knee: Secondary | ICD-10-CM | POA: Diagnosis not present

## 2021-09-27 DIAGNOSIS — R262 Difficulty in walking, not elsewhere classified: Secondary | ICD-10-CM | POA: Diagnosis not present

## 2021-09-27 DIAGNOSIS — M25651 Stiffness of right hip, not elsewhere classified: Secondary | ICD-10-CM | POA: Diagnosis not present

## 2021-09-27 DIAGNOSIS — M25561 Pain in right knee: Secondary | ICD-10-CM | POA: Diagnosis not present

## 2021-09-29 DIAGNOSIS — M25561 Pain in right knee: Secondary | ICD-10-CM | POA: Diagnosis not present

## 2021-09-29 DIAGNOSIS — R262 Difficulty in walking, not elsewhere classified: Secondary | ICD-10-CM | POA: Diagnosis not present

## 2021-09-29 DIAGNOSIS — M25651 Stiffness of right hip, not elsewhere classified: Secondary | ICD-10-CM | POA: Diagnosis not present

## 2021-10-04 DIAGNOSIS — M25561 Pain in right knee: Secondary | ICD-10-CM | POA: Diagnosis not present

## 2021-10-04 DIAGNOSIS — M25651 Stiffness of right hip, not elsewhere classified: Secondary | ICD-10-CM | POA: Diagnosis not present

## 2021-10-04 DIAGNOSIS — R262 Difficulty in walking, not elsewhere classified: Secondary | ICD-10-CM | POA: Diagnosis not present

## 2021-10-06 DIAGNOSIS — R262 Difficulty in walking, not elsewhere classified: Secondary | ICD-10-CM | POA: Diagnosis not present

## 2021-10-06 DIAGNOSIS — M25651 Stiffness of right hip, not elsewhere classified: Secondary | ICD-10-CM | POA: Diagnosis not present

## 2021-10-06 DIAGNOSIS — M25561 Pain in right knee: Secondary | ICD-10-CM | POA: Diagnosis not present

## 2021-10-12 DIAGNOSIS — M25551 Pain in right hip: Secondary | ICD-10-CM | POA: Diagnosis not present

## 2021-10-19 DIAGNOSIS — M25551 Pain in right hip: Secondary | ICD-10-CM | POA: Diagnosis not present

## 2021-10-24 ENCOUNTER — Encounter: Payer: Self-pay | Admitting: Gastroenterology

## 2021-10-31 ENCOUNTER — Encounter: Payer: Self-pay | Admitting: Gastroenterology

## 2021-11-01 DIAGNOSIS — Z85820 Personal history of malignant melanoma of skin: Secondary | ICD-10-CM | POA: Diagnosis not present

## 2021-11-01 DIAGNOSIS — L821 Other seborrheic keratosis: Secondary | ICD-10-CM | POA: Diagnosis not present

## 2021-11-01 DIAGNOSIS — Z85828 Personal history of other malignant neoplasm of skin: Secondary | ICD-10-CM | POA: Diagnosis not present

## 2021-11-01 DIAGNOSIS — D225 Melanocytic nevi of trunk: Secondary | ICD-10-CM | POA: Diagnosis not present

## 2021-11-01 DIAGNOSIS — Z08 Encounter for follow-up examination after completed treatment for malignant neoplasm: Secondary | ICD-10-CM | POA: Diagnosis not present

## 2021-11-01 DIAGNOSIS — Z872 Personal history of diseases of the skin and subcutaneous tissue: Secondary | ICD-10-CM | POA: Diagnosis not present

## 2021-11-01 DIAGNOSIS — L814 Other melanin hyperpigmentation: Secondary | ICD-10-CM | POA: Diagnosis not present

## 2021-11-01 DIAGNOSIS — Z86006 Personal history of melanoma in-situ: Secondary | ICD-10-CM | POA: Diagnosis not present

## 2021-11-01 DIAGNOSIS — Z09 Encounter for follow-up examination after completed treatment for conditions other than malignant neoplasm: Secondary | ICD-10-CM | POA: Diagnosis not present

## 2021-11-09 ENCOUNTER — Telehealth: Payer: Self-pay | Admitting: *Deleted

## 2021-11-09 NOTE — Telephone Encounter (Signed)
Osvaldo Angst CRNA, ? ?Please review pt's chart. Past surgical hx. Okay for [place]? Please advise. Thanks,Rhiannon Sassaman pv ?

## 2021-11-09 NOTE — Telephone Encounter (Signed)
Dr.Jacobs, ? ?Patient is a difficult intubation and needs recall colonoscopy at hospital per John Nulty,CRNA. Okay for direct hospital or OV? Please advise. Thank you, Anya Murphey PV ?

## 2021-11-10 ENCOUNTER — Other Ambulatory Visit: Payer: Self-pay

## 2021-11-10 DIAGNOSIS — Z1211 Encounter for screening for malignant neoplasm of colon: Secondary | ICD-10-CM

## 2021-11-10 NOTE — Telephone Encounter (Signed)
Robbin I have the pt scheduled at [facility] on 01/05/22 at 1030 am.   ?

## 2021-11-23 ENCOUNTER — Ambulatory Visit (AMBULATORY_SURGERY_CENTER): Payer: Medicare HMO | Admitting: *Deleted

## 2021-11-23 VITALS — Ht 67.0 in | Wt 195.0 lb

## 2021-11-23 DIAGNOSIS — Z86010 Personal history of colonic polyps: Secondary | ICD-10-CM

## 2021-11-23 MED ORDER — NA SULFATE-K SULFATE-MG SULF 17.5-3.13-1.6 GM/177ML PO SOLN: 2.0000 | Freq: Once | ORAL | 0 refills | Status: AC

## 2021-11-23 NOTE — Progress Notes (Signed)

## 2021-12-08 DIAGNOSIS — M25551 Pain in right hip: Secondary | ICD-10-CM | POA: Diagnosis not present

## 2021-12-08 DIAGNOSIS — M1611 Unilateral primary osteoarthritis, right hip: Secondary | ICD-10-CM | POA: Diagnosis not present

## 2021-12-13 ENCOUNTER — Encounter: Payer: Medicare HMO | Admitting: Gastroenterology

## 2021-12-14 DIAGNOSIS — M65341 Trigger finger, right ring finger: Secondary | ICD-10-CM | POA: Diagnosis not present

## 2021-12-14 DIAGNOSIS — M65321 Trigger finger, right index finger: Secondary | ICD-10-CM | POA: Diagnosis not present

## 2021-12-14 DIAGNOSIS — M65331 Trigger finger, right middle finger: Secondary | ICD-10-CM | POA: Diagnosis not present

## 2021-12-14 DIAGNOSIS — M7712 Lateral epicondylitis, left elbow: Secondary | ICD-10-CM | POA: Diagnosis not present

## 2021-12-29 ENCOUNTER — Encounter (HOSPITAL_COMMUNITY): Payer: Self-pay | Admitting: Gastroenterology

## 2021-12-29 NOTE — Progress Notes (Signed)
Attempted to obtain medical history via telephone, unable to reach at this time. HIPAA compliant voicemail message left requesting return call to pre surgical testing department. 

## 2022-01-02 ENCOUNTER — Encounter: Payer: Self-pay | Admitting: Gastroenterology

## 2022-01-05 ENCOUNTER — Encounter (HOSPITAL_COMMUNITY): Payer: Self-pay | Admitting: Gastroenterology

## 2022-01-05 ENCOUNTER — Ambulatory Visit (HOSPITAL_COMMUNITY)
Admission: RE | Admit: 2022-01-05 | Discharge: 2022-01-05 | Disposition: A | Discharge status: Home / Self Care | Payer: Medicare HMO | Source: Ambulatory Visit | Attending: Gastroenterology | Admitting: Gastroenterology

## 2022-01-05 ENCOUNTER — Ambulatory Visit (HOSPITAL_BASED_OUTPATIENT_CLINIC_OR_DEPARTMENT_OTHER): Payer: Medicare HMO | Admitting: Certified Registered"

## 2022-01-05 ENCOUNTER — Ambulatory Visit (HOSPITAL_COMMUNITY): Payer: Medicare HMO | Admitting: Certified Registered"

## 2022-01-05 ENCOUNTER — Encounter (HOSPITAL_COMMUNITY)
Admission: RE | Disposition: A | Discharge status: Home / Self Care | Payer: Self-pay | Source: Ambulatory Visit | Attending: Gastroenterology

## 2022-01-05 ENCOUNTER — Other Ambulatory Visit: Payer: Self-pay

## 2022-01-05 DIAGNOSIS — I10 Essential (primary) hypertension: Secondary | ICD-10-CM | POA: Diagnosis not present

## 2022-01-05 DIAGNOSIS — K648 Other hemorrhoids: Secondary | ICD-10-CM

## 2022-01-05 DIAGNOSIS — K5730 Diverticulosis of large intestine without perforation or abscess without bleeding: Secondary | ICD-10-CM | POA: Diagnosis not present

## 2022-01-05 DIAGNOSIS — Z86010 Personal history of colonic polyps: Secondary | ICD-10-CM

## 2022-01-05 DIAGNOSIS — Z139 Encounter for screening, unspecified: Secondary | ICD-10-CM | POA: Diagnosis not present

## 2022-01-05 DIAGNOSIS — K5790 Diverticulosis of intestine, part unspecified, without perforation or abscess without bleeding: Secondary | ICD-10-CM | POA: Diagnosis not present

## 2022-01-05 DIAGNOSIS — K644 Residual hemorrhoidal skin tags: Secondary | ICD-10-CM | POA: Insufficient documentation

## 2022-01-05 DIAGNOSIS — Z1211 Encounter for screening for malignant neoplasm of colon: Secondary | ICD-10-CM | POA: Diagnosis not present

## 2022-01-05 DIAGNOSIS — I350 Nonrheumatic aortic (valve) stenosis: Secondary | ICD-10-CM | POA: Insufficient documentation

## 2022-01-05 DIAGNOSIS — K635 Polyp of colon: Secondary | ICD-10-CM

## 2022-01-05 DIAGNOSIS — D122 Benign neoplasm of ascending colon: Secondary | ICD-10-CM | POA: Diagnosis not present

## 2022-01-05 DIAGNOSIS — K649 Unspecified hemorrhoids: Secondary | ICD-10-CM | POA: Diagnosis not present

## 2022-01-05 DIAGNOSIS — D123 Benign neoplasm of transverse colon: Secondary | ICD-10-CM | POA: Insufficient documentation

## 2022-01-05 DIAGNOSIS — Z8673 Personal history of transient ischemic attack (TIA), and cerebral infarction without residual deficits: Secondary | ICD-10-CM | POA: Insufficient documentation

## 2022-01-05 HISTORY — PX: POLYPECTOMY: SHX5525

## 2022-01-05 HISTORY — PX: HEMOSTASIS CLIP PLACEMENT: SHX6857

## 2022-01-05 HISTORY — PX: COLONOSCOPY WITH PROPOFOL: SHX5780

## 2022-01-05 SURGERY — COLONOSCOPY WITH PROPOFOL
Anesthesia: Monitor Anesthesia Care

## 2022-01-05 MED ORDER — PROPOFOL 500 MG/50ML IV EMUL
INTRAVENOUS | Status: DC | PRN
  Administered 2022-01-05: 125 ug/kg/min via INTRAVENOUS

## 2022-01-05 MED ORDER — LIDOCAINE 2% (20 MG/ML) 5 ML SYRINGE
INTRAMUSCULAR | Status: DC | PRN
  Administered 2022-01-05: 40 mg via INTRAVENOUS

## 2022-01-05 MED ORDER — LACTATED RINGERS IV SOLN
INTRAVENOUS | Status: DC | PRN
Start: 2022-01-05 — End: 2022-01-05

## 2022-01-05 MED ORDER — PROPOFOL 10 MG/ML IV BOLUS
INTRAVENOUS | Status: DC | PRN
  Administered 2022-01-05: 10 mg via INTRAVENOUS
  Administered 2022-01-05: 20 mg via INTRAVENOUS

## 2022-01-05 MED ORDER — SODIUM CHLORIDE 0.9 % IV SOLN: INTRAVENOUS | Status: DC

## 2022-01-05 MED ORDER — LACTATED RINGERS IV SOLN
INTRAVENOUS | Status: DC
Start: 2022-01-05 — End: 2022-01-05

## 2022-01-05 SURGICAL SUPPLY — 22 items

## 2022-01-05 NOTE — Anesthesia Postprocedure Evaluation (Signed)
Anesthesia Post Note  Patient: Anthony Hebert  Procedure(s) Performed: COLONOSCOPY WITH PROPOFOL     Patient location during evaluation: PACU Anesthesia Type: MAC Level of consciousness: awake and alert Pain management: pain level controlled Vital Signs Assessment: post-procedure vital signs reviewed and stable Respiratory status: spontaneous breathing, nonlabored ventilation, respiratory function stable and patient connected to nasal cannula oxygen Cardiovascular status: stable and blood pressure returned to baseline Postop Assessment: no apparent nausea or vomiting Anesthetic complications: no   No notable events documented.  Last Vitals:  Vitals:   01/05/22 0940 01/05/22 0946  BP: 104/82 125/77  Pulse: 76 65  Resp: 13 14  Temp: 36.4 C 36.6 C  SpO2: 96% 97%    Last Pain:  Vitals:   01/05/22 0946  TempSrc: Temporal  PainSc: 0-No pain                 Charlei Ramsaran S

## 2022-01-05 NOTE — Anesthesia Preprocedure Evaluation (Signed)
Anesthesia Evaluation  Patient identified by MRN, date of birth, ID band Patient awake    Reviewed: Allergy & Precautions, NPO status , Patient's Chart, lab work & pertinent test results  History of Anesthesia Complications (+) DIFFICULT AIRWAY and history of anesthetic complications  Airway Mallampati: IV  TM Distance: <3 FB Neck ROM: Full    Dental no notable dental hx.    Pulmonary neg pulmonary ROS,    Pulmonary exam normal breath sounds clear to auscultation       Cardiovascular hypertension, Normal cardiovascular exam+ Valvular Problems/Murmurs AS  Rhythm:Regular Rate:Normal     Neuro/Psych CVA negative psych ROS   GI/Hepatic Neg liver ROS, GERD  ,  Endo/Other  negative endocrine ROS  Renal/GU negative Renal ROS  negative genitourinary   Musculoskeletal negative musculoskeletal ROS (+)   Abdominal   Peds negative pediatric ROS (+)  Hematology negative hematology ROS (+)   Anesthesia Other Findings   Reproductive/Obstetrics negative OB ROS                             Anesthesia Physical Anesthesia Plan  ASA: 3  Anesthesia Plan: MAC   Post-op Pain Management: Minimal or no pain anticipated   Induction: Intravenous  PONV Risk Score and Plan: 1 and Propofol infusion and Treatment may vary due to age or medical condition  Airway Management Planned: Simple Face Mask  Additional Equipment:   Intra-op Plan:   Post-operative Plan:   Informed Consent: I have reviewed the patients History and Physical, chart, labs and discussed the procedure including the risks, benefits and alternatives for the proposed anesthesia with the patient or authorized representative who has indicated his/her understanding and acceptance.     Dental advisory given  Plan Discussed with: CRNA and Surgeon  Anesthesia Plan Comments:         Anesthesia Quick Evaluation

## 2022-01-05 NOTE — Anesthesia Procedure Notes (Signed)
Procedure Name: MAC Date/Time: 01/05/2022 8:56 AM Performed by: Eben Burow, CRNA Pre-anesthesia Checklist: Patient identified, Emergency Drugs available, Suction available, Patient being monitored and Timeout performed Oxygen Delivery Method: Simple face mask Placement Confirmation: positive ETCO2

## 2022-01-05 NOTE — Op Note (Signed)
[facility] Patient Name: Anthony Hebert Procedure Date: 01/05/2022 MRN: [id number] Attending MD: Milus Banister , MD Date of Birth: 08-06-1951 CSN: [id number] Age: 71 Admit Type: Outpatient Procedure:                Colonoscopy Indications:              High risk colon cancer surveillance: Personal                            history of colonic polyps; Colonoscopy 2007 Dr.                            Ardis Hughs, three subCM adenomas. Colonoscopy 2010 no                            polyps. Colonoscopy 09/2014 three subCM polyps, one                            was a TA Providers:                Milus Banister, MD, Autumn Goldsimth, Cherylynn Ridges, Technician, Jefm Miles CRNA Referring MD:              Medicines:                Monitored Anesthesia Care Complications:            No immediate complications. Estimated blood loss:                            None. Estimated Blood Loss:     Estimated blood loss: none. Procedure:                Pre-Anesthesia Assessment:                           - Prior to the procedure, a History and Physical                            was performed, and patient medications and                            allergies were reviewed. The patient's tolerance of                            previous anesthesia was also reviewed. The risks                            and benefits of the procedure and the sedation                            options and risks were discussed with the patient.                            All questions were  answered, and informed consent                            was obtained. Prior Anticoagulants: The patient has                            taken no previous anticoagulant or antiplatelet                            agents. ASA Grade Assessment: III - A patient with                            severe systemic disease. After reviewing the risks                            and benefits, the patient was  deemed in                            satisfactory condition to undergo the procedure.                           After obtaining informed consent, the colonoscope                            was passed under direct vision. Throughout the                            procedure, the patient's blood pressure, pulse, and                            oxygen saturations were monitored continuously. The                            CF-HQ190L ([id number]) Olympus colonoscope was                            introduced through the anus and advanced to the the                            cecum, identified by appendiceal orifice and                            ileocecal valve. The colonoscopy was performed                            without difficulty. The patient tolerated the                            procedure well. The quality of the bowel                            preparation was good. The ileocecal valve,  appendiceal orifice, and rectum were photographed. Scope In: 9:03:23 AM Scope Out: 9:21:17 AM Scope Withdrawal Time: 0 hours 12 minutes 18 seconds  Total Procedure Duration: 0 hours 17 minutes 54 seconds  Findings:      Two sessile polyps were found in the transverse colon and ascending       colon. The polyps were 3 to 4 mm in size. These polyps were removed with       a cold snare. Resection and retrieval were complete. The larger of these       polyps, located in the transverse segment, bled immediately and briskly       following polypectomy. I placed a single endoclip at the site resulting       in immediate hemostasis.      Multiple small and large-mouthed diverticula were found in the left       colon.      External and internal hemorrhoids were found. The hemorrhoids were small.      The exam was otherwise without abnormality on direct and retroflexion       views. Impression:               - Two 3 to 4 mm polyps in the transverse colon and                             in the ascending colon, removed with a cold snare.                            Resected and retrieved. The larger of these polyps,                            located in the transverse segment, bled immediately                            and briskly following polypectomy. I placed a                            single endoclip at the site resulting in immediate                            hemostasis.                           - Diverticulosis in the left colon.                           - External and internal hemorrhoids.                           - The examination was otherwise normal on direct                            and retroflexion views. Moderate Sedation:      Not Applicable - Patient had care per Anesthesia. Recommendation:           - Patient has a contact number available for  emergencies. The signs and symptoms of potential                            delayed complications were discussed with the                            patient. Return to normal activities tomorrow.                            Written discharge instructions were provided to the                            patient.                           - Resume previous diet.                           - Continue present medications.                           - Await pathology results. Procedure Code(s):        --- Professional ---                           [phone], Colonoscopy, flexible; with removal of                            tumor(s), polyp(s), or other lesion(s) by snare                            technique Diagnosis Code(s):        --- Professional ---                           Z86.010, Personal history of colonic polyps                           K63.5, Polyp of colon                           K64.8, Other hemorrhoids                           K57.30, Diverticulosis of large intestine without                            perforation or abscess without bleeding CPT copyright 2019 American  Medical Association. All rights reserved. The codes documented in this report are preliminary and upon coder review may  be revised to meet current compliance requirements. Milus Banister, MD 01/05/2022 9:26:37 AM This report has been signed electronically. Number of Addenda: 0

## 2022-01-05 NOTE — Transfer of Care (Signed)
Immediate Anesthesia Transfer of Care Note  Patient: INAKI VANTINE  Procedure(s) Performed: COLONOSCOPY WITH PROPOFOL  Patient Location: PACU and Endoscopy Unit  Anesthesia Type:MAC  Level of Consciousness: awake, alert  and patient cooperative  Airway & Oxygen Therapy: Patient Spontanous Breathing and Patient connected to face mask oxygen  Post-op Assessment: Report given to RN and Post -op Vital signs reviewed and stable  Post vital signs: Reviewed and stable  Last Vitals:  Vitals Value Taken Time  BP 120/69 01/05/22 0930  Temp 36.8 C 01/05/22 0930  Pulse 87 01/05/22 0930  Resp 19 01/05/22 0930  SpO2 98 % 01/05/22 0930    Last Pain:  Vitals:   01/05/22 0930  TempSrc: Temporal  PainSc: 0-No pain         Complications: No notable events documented.

## 2022-01-05 NOTE — Discharge Instructions (Signed)
YOU HAD AN ENDOSCOPIC PROCEDURE TODAY: Refer to the procedure report and other information in the discharge instructions given to you for any specific questions about what was found during the examination. If this information does not answer your questions, please call [place] office at [phone] to clarify.   YOU SHOULD EXPECT: Some feelings of bloating in the abdomen. Passage of more gas than usual. Walking can help get rid of the air that was put into your GI tract during the procedure and reduce the bloating. If you had a lower endoscopy (such as a colonoscopy or flexible sigmoidoscopy) you may notice spotting of blood in your stool or on the toilet paper. Some abdominal soreness may be present for a day or two, also.  DIET: Your first meal following the procedure should be a light meal and then it is ok to progress to your normal diet. A half-sandwich or bowl of soup is an example of a good first meal. Heavy or fried foods are harder to digest and may make you feel nauseous or bloated. Drink plenty of fluids but you should avoid alcoholic beverages for 24 hours. If you had a esophageal dilation, please see attached instructions for diet.    ACTIVITY: Your care partner should take you home directly after the procedure. You should plan to take it easy, moving slowly for the rest of the day. You can resume normal activity the day after the procedure however YOU SHOULD NOT DRIVE, use power tools, machinery or perform tasks that involve climbing or major physical exertion for 24 hours (because of the sedation medicines used during the test).   SYMPTOMS TO REPORT IMMEDIATELY: A gastroenterologist can be reached at any hour. Please call [phone]  for any of the following symptoms:  Following lower endoscopy (colonoscopy, flexible sigmoidoscopy) Excessive amounts of blood in the stool  Significant tenderness, worsening of abdominal pains  Swelling of the abdomen that is new, acute  Fever of 100 or  higher  Following upper endoscopy (EGD, EUS, ERCP, esophageal dilation) Vomiting of blood or coffee ground material  New, significant abdominal pain  New, significant chest pain or pain under the shoulder blades  Painful or persistently difficult swallowing  New shortness of breath  Black, tarry-looking or red, bloody stools  FOLLOW UP:  If any biopsies were taken you will be contacted by phone or by letter within the next 1-3 weeks. Call [phone]  if you have not heard about the biopsies in 3 weeks.  Please also call with any specific questions about appointments or follow up tests.YOU HAD AN ENDOSCOPIC PROCEDURE TODAY: Refer to the procedure report and other information in the discharge instructions given to you for any specific questions about what was found during the examination. If this information does not answer your questions, please call [place] office at [phone] to clarify.   YOU SHOULD EXPECT: Some feelings of bloating in the abdomen. Passage of more gas than usual. Walking can help get rid of the air that was put into your GI tract during the procedure and reduce the bloating. If you had a lower endoscopy (such as a colonoscopy or flexible sigmoidoscopy) you may notice spotting of blood in your stool or on the toilet paper. Some abdominal soreness may be present for a day or two, also.  DIET: Your first meal following the procedure should be a light meal and then it is ok to progress to your normal diet. A half-sandwich or bowl of soup is an example of a   good first meal. Heavy or fried foods are harder to digest and may make you feel nauseous or bloated. Drink plenty of fluids but you should avoid alcoholic beverages for 24 hours. If you had a esophageal dilation, please see attached instructions for diet.    ACTIVITY: Your care partner should take you home directly after the procedure. You should plan to take it easy, moving slowly for the rest of the day. You can resume  normal activity the day after the procedure however YOU SHOULD NOT DRIVE, use power tools, machinery or perform tasks that involve climbing or major physical exertion for 24 hours (because of the sedation medicines used during the test).   SYMPTOMS TO REPORT IMMEDIATELY: A gastroenterologist can be reached at any hour. Please call [phone]  for any of the following symptoms:  Following lower endoscopy (colonoscopy, flexible sigmoidoscopy) Excessive amounts of blood in the stool  Significant tenderness, worsening of abdominal pains  Swelling of the abdomen that is new, acute  Fever of 100 or higher  FOLLOW UP:  If any biopsies were taken you will be contacted by phone or by letter within the next 1-3 weeks. Call [phone]  if you have not heard about the biopsies in 3 weeks.  Please also call with any specific questions about appointments or follow up tests. 

## 2022-01-05 NOTE — H&P (Signed)
Colonoscopy 2007 Dr. Ardis Hughs, three subCM adenomas. Colonoscopy 2010 no polyps. Colonoscopy 09/2014 three subCM polyps, one was a TA  HPI: This is a man with h/o polyps.  Documented difficult to intubate airway   ROS: complete GI ROS as described in HPI, all other review negative.  Constitutional:  No unintentional weight loss   Past Medical History:  Diagnosis Date   Acute idiopathic gout of right ankle    Aortic valve sclerosis    Arthritis    "all over"   Chronic back pain    "all over"   Complication of anesthesia    slow to wake up at times   DDD (degenerative disc disease), lumbar    Difficult intubation    had trouble with intubation shape of throat;last sx no problems per pt; 03/28/16 successful oral ETT with green Glidescope, stylet. Has limited oral opening/small glottic opening   Foot swelling    in [facility] 07/2013   GERD (gastroesophageal reflux disease)    occ   Gout    Gout of left foot    Heart murmur    states no problems   History of melanoma    Hypercholesterolemia    Hyperlipidemia    Hypertension    Melanoma ([place]) ~ 2010   "above left eye"   Nonrheumatic aortic valve stenosis    Other chronic pain    Primary osteoarthritis involving multiple joints    Rising PSA level    Screening for prostate cancer    Stroke [facility]    "maybe w/my 1st back OR in the 1990's"; denies residual on 07/15/2015    Past Surgical History:  Procedure Laterality Date   APPENDECTOMY     BACK SURGERY     CARPAL TUNNEL RELEASE Right 07/15/2015   w/GUYON'S CANAL RELEASE    CARPAL TUNNEL RELEASE Right 07/15/2015   Procedure: RIGHT FOREARM AND WRIST CARPAL TUNNEL RELEASE;  Surgeon: Roseanne Kaufman, MD;  Location: [place];  Service: Orthopedics;  Laterality: Right;   CARPAL TUNNEL RELEASE Left 03/22/2017   Procedure: OPEN CARPAL TUNNEL RELEASE;  Surgeon: Roseanne Kaufman, MD;  Location: [place];  Service: Orthopedics;  Laterality: Left;  60 mins   COLONOSCOPY     FOOT SURGERY Right     cut nerve to relieve pain   INGUINAL HERNIA REPAIR Left 1994   INSERTION OF MESH N/A 03/28/2016   Procedure: INSERTION OF MESH;  Surgeon: Rolm Bookbinder, MD;  Location: [place];  Service: General;  Laterality: N/A;   MELANOMA EXCISION     POSTERIOR CERVICAL LAMINECTOMY Right 10/07/2013   Procedure: POSTERIOR CERVICAL LAMINECTOMY RIGHT CERVICAL SEVEN -THORACIC ONE FORAMINOTOMY;  Surgeon: Faythe Ghee, MD;  Location: [facility];  Service: Neurosurgery;  Laterality: Right;   POSTERIOR LUMBAR FUSION  X 7   SPINAL CORD STIMULATOR IMPLANT  ~ 2012   SPINAL CORD STIMULATOR REMOVAL N/A 09/19/2012   Procedure: LUMBAR SPINAL CORD STIMULATOR REMOVAL;  Surgeon: Faythe Ghee, MD;  Location: [facility];  Service: Neurosurgery;  Laterality: N/A;   TONSILLECTOMY     ULNAR NERVE TRANSPOSITION Right 07/15/2015   supercharged AIN to deep motor branch ulnar nerve nerve transfer   UMBILICAL HERNIA REPAIR N/A 03/28/2016   Procedure: LAPAROSCOPIC UMBILICAL HERNIA;  Surgeon: Rolm Bookbinder, MD;  Location: [place];  Service: General;  Laterality: N/A;    Current Outpatient Medications  Medication Instructions   allopurinol (ZYLOPRIM) 200 mg, Oral, Daily   Colcrys 0.6 mg, Oral, Every 8 hours PRN  HYDROcodone-acetaminophen (NORCO/VICODIN) 5-325 MG tablet 1 tablet, Oral, Every 6 hours PRN   lisinopril-hydrochlorothiazide (PRINZIDE,ZESTORETIC) 20-12.5 MG tablet 1 tablet, Oral, Daily   methocarbamol (ROBAXIN) 500 mg, Oral, Every 6 hours PRN   RESTASIS 0.05 % ophthalmic emulsion 1 drop, Both Eyes, 2 times daily   rosuvastatin (CRESTOR) 20 mg, Oral, Daily    Allergies as of 11/10/2021 - Review Complete 06/23/2021  Allergen Reaction Noted   No known allergies  03/27/2016    Family History  Problem Relation Age of Onset   Cancer Mother    Alzheimer's disease Father    Colon cancer Neg Hx    Colon polyps Neg Hx    Esophageal cancer Neg Hx    Stomach cancer Neg Hx    Rectal cancer Neg Hx     Social  History   Socioeconomic History   Marital status: Divorced    Spouse name: Not on file   Number of children: Not on file   Years of education: Not on file   Highest education level: Not on file  Occupational History   Not on file  Tobacco Use   Smoking status: Never   Smokeless tobacco: Never  Vaping Use   Vaping Use: Never used  Substance and Sexual Activity   Alcohol use: Yes    Alcohol/week: 1.0 - 2.0 standard drink    Types: 1 - 2 Cans of beer per week   Drug use: No   Sexual activity: Not Currently  Other Topics Concern   Not on file  Social History Narrative   Not on file   Social Determinants of Health   Financial Resource Strain: Not on file  Food Insecurity: Not on file  Transportation Needs: Not on file  Physical Activity: Not on file  Stress: Not on file  Social Connections: Not on file  Intimate Partner Violence: Not on file     Physical Exam: Ht '5\' 6"'$  (1.676 m)   Wt 88.9 kg   BMI 31.64 kg/m  Constitutional: generally well-appearing Psychiatric: alert and oriented x3 Abdomen: soft, nontender, nondistended, no obvious ascites, no peritoneal signs, normal bowel sounds No peripheral edema noted in lower extremities  Assessment and plan: 71 y.o. male with h/o polyps  surveillance colonoscopy today    Please see the "Patient Instructions" section for addition details about the plan.  Owens Loffler, MD [facility] 01/05/2022, 8:39 AM

## 2022-01-06 LAB — SURGICAL PATHOLOGY

## 2022-01-18 DIAGNOSIS — M5416 Radiculopathy, lumbar region: Secondary | ICD-10-CM | POA: Diagnosis not present

## 2022-01-18 DIAGNOSIS — H524 Presbyopia: Secondary | ICD-10-CM | POA: Diagnosis not present

## 2022-01-18 DIAGNOSIS — Z6832 Body mass index (BMI) 32.0-32.9, adult: Secondary | ICD-10-CM | POA: Diagnosis not present

## 2022-01-18 DIAGNOSIS — Z961 Presence of intraocular lens: Secondary | ICD-10-CM | POA: Diagnosis not present

## 2022-01-23 ENCOUNTER — Telehealth: Payer: Self-pay | Admitting: *Deleted

## 2022-01-23 NOTE — Telephone Encounter (Signed)
   Pre-operative Risk Assessment    Patient Name: Anthony Hebert  DOB: 1950/12/05 MRN: [id number]      Request for Surgical Clearance    Procedure:   RIGHT TOTAL HIP ARTHROPLASTY  Date of Surgery:  Clearance 05/01/22                                 Surgeon:  DR. Gaynelle Arabian Surgeon's Group or Practice Name:  [facility] Phone number:  [phone] ATTN: [place] [facility] Fax number:  [phone]   Type of Clearance Requested:   - Medical ; NO MEDICATIONS ARE LISTED AS NEEDING TO BE HELD   Type of Anesthesia:   CHOICE   Additional requests/questions:    Jiles Prows   01/23/2022, 1:18 PM

## 2022-01-24 NOTE — Telephone Encounter (Signed)
Spoke with patient and scheduled him for a pre-op clearance appointment on 02/24/22 at 8:00 AM with Christen Bame, NP. Will route to requesting surgeons office to make them aware.

## 2022-01-24 NOTE — Telephone Encounter (Signed)
Primary Cardiologist:None  Chart reviewed as part of pre-operative protocol coverage. Because of Anthony Hebert past medical history and time since last visit, Anthony Hebert will require a follow-up visit in order to better assess preoperative cardiovascular risk.  Pre-op covering staff: - Please schedule appointment for in-person visit prior to surgery and call patient to inform them. - Please contact requesting surgeon's office via preferred method (i.e, phone, fax) to inform them of need for appointment prior to surgery.  There is no request for patient to hold antiplatelet or anticoagulant medication and there are none of these agents on patient's medication list.  Emmaline Life, NP-C    01/24/2022, 10:21 AM [name] [address], [address] Office [phone] Fax [phone]

## 2022-02-09 DIAGNOSIS — S6990XA Unspecified injury of unspecified wrist, hand and finger(s), initial encounter: Secondary | ICD-10-CM | POA: Diagnosis not present

## 2022-02-09 DIAGNOSIS — S299XXA Unspecified injury of thorax, initial encounter: Secondary | ICD-10-CM | POA: Diagnosis not present

## 2022-02-12 DIAGNOSIS — M25552 Pain in left hip: Secondary | ICD-10-CM | POA: Diagnosis not present

## 2022-02-12 DIAGNOSIS — M545 Low back pain, unspecified: Secondary | ICD-10-CM | POA: Diagnosis not present

## 2022-02-12 DIAGNOSIS — S3992XA Unspecified injury of lower back, initial encounter: Secondary | ICD-10-CM | POA: Diagnosis not present

## 2022-02-12 DIAGNOSIS — S63501A Unspecified sprain of right wrist, initial encounter: Secondary | ICD-10-CM | POA: Diagnosis not present

## 2022-02-12 DIAGNOSIS — M25531 Pain in right wrist: Secondary | ICD-10-CM | POA: Diagnosis not present

## 2022-02-12 DIAGNOSIS — S32040A Wedge compression fracture of fourth lumbar vertebra, initial encounter for closed fracture: Secondary | ICD-10-CM | POA: Diagnosis not present

## 2022-02-12 DIAGNOSIS — Z85828 Personal history of other malignant neoplasm of skin: Secondary | ICD-10-CM | POA: Diagnosis not present

## 2022-02-12 DIAGNOSIS — M7989 Other specified soft tissue disorders: Secondary | ICD-10-CM | POA: Diagnosis not present

## 2022-02-12 DIAGNOSIS — M546 Pain in thoracic spine: Secondary | ICD-10-CM | POA: Diagnosis not present

## 2022-02-12 DIAGNOSIS — M79651 Pain in right thigh: Secondary | ICD-10-CM | POA: Diagnosis not present

## 2022-02-12 DIAGNOSIS — S299XXA Unspecified injury of thorax, initial encounter: Secondary | ICD-10-CM | POA: Diagnosis not present

## 2022-02-12 DIAGNOSIS — S32049A Unspecified fracture of fourth lumbar vertebra, initial encounter for closed fracture: Secondary | ICD-10-CM | POA: Diagnosis not present

## 2022-02-16 DIAGNOSIS — S32040A Wedge compression fracture of fourth lumbar vertebra, initial encounter for closed fracture: Secondary | ICD-10-CM | POA: Diagnosis not present

## 2022-02-19 NOTE — Progress Notes (Signed)
Cardiology Office Note:    Date:  02/24/2022   ID:  Anthony Hebert, DOB 1951/05/11, MRN [id number]  PCP:  [place], [place] Providers Cardiologist:  None     Referring MD: [facility]*   Chief Complaint: preoperative cardiac evaluation  History of Present Illness:    Anthony Hebert is a very pleasant 71 y.o. male with a hx of hypertension, hyperlipidemia, nonrheumatic aortic valve stenosis. Reports history of remote MI after back surgery.  EKG showed the MI but he never had other testing done.  He does not recall having chest pain at that time.  He was last seen in our office on 06/06/2021 by Dr. Irish Lack at which time he  Echocardiogram 06/23/2021 revealed normal LVEF 60 to 65%, normal RV, no RWMA, moderate calcification of aortic valve with mild aortic valve stenosis. He was advised to increase intensity of his statin therapy and follow up in 1 year.   He is now pending right total hip arthroplasty and is here today for preoperative cardiac evaluation. He fell off a rook on 6/29 and is suffering with muscle and back pain and has been told he has L4 fracture and compression. His right hip is very painful.  Plan is for right hip arthroplasty 9/18 with Dr. Maureen Ralphs. He denies chest pain, shortness of breath, lower fatigue, palpitations, diaphoresis, weakness, presyncope, syncope, orthopnea, and PND. Does not monitor BP at home but reports BP is usually well-controlled at other appointments. He sees Dr. Sheryn Bison for primary care - has lab work in a few weeks.   Past Medical History:  Diagnosis Date   Acute idiopathic gout of right ankle    Aortic valve sclerosis    Arthritis    "all over"   Chronic back pain    "all over"   Complication of anesthesia    slow to wake up at times   DDD (degenerative disc disease), lumbar    Difficult intubation    had trouble with intubation shape of throat;last sx no problems per pt; 03/28/16 successful oral  ETT with green Glidescope, stylet. Has limited oral opening/small glottic opening   Foot swelling    in [place] 07/2013   GERD (gastroesophageal reflux disease)    occ   Gout    Gout of left foot    Heart murmur    states no problems   History of melanoma    Hypercholesterolemia    Hyperlipidemia    Hypertension    Melanoma ([place]) ~ 2010   "above left eye"   Nonrheumatic aortic valve stenosis    Other chronic pain    Primary osteoarthritis involving multiple joints    Rising PSA level    Screening for prostate cancer    Stroke [facility])    "maybe w/my 1st back OR in the 1990's"; denies residual on 07/15/2015    Past Surgical History:  Procedure Laterality Date   APPENDECTOMY     BACK SURGERY     CARPAL TUNNEL RELEASE Right 07/15/2015   w/GUYON'S CANAL RELEASE    CARPAL TUNNEL RELEASE Right 07/15/2015   Procedure: RIGHT FOREARM AND WRIST CARPAL TUNNEL RELEASE;  Surgeon: Roseanne Kaufman, MD;  Location: [place];  Service: Orthopedics;  Laterality: Right;   CARPAL TUNNEL RELEASE Left 03/22/2017   Procedure: OPEN CARPAL TUNNEL RELEASE;  Surgeon: Roseanne Kaufman, MD;  Location: [place];  Service: Orthopedics;  Laterality: Left;  60 mins   COLONOSCOPY  COLONOSCOPY WITH PROPOFOL N/A 01/05/2022   Procedure: COLONOSCOPY WITH PROPOFOL;  Surgeon: Milus Banister, MD;  Location: [facility];  Service: Gastroenterology;  Laterality: N/A;   FOOT SURGERY Right    cut nerve to relieve pain   HEMOSTASIS CLIP PLACEMENT  01/05/2022   Procedure: HEMOSTASIS CLIP PLACEMENT;  Surgeon: Milus Banister, MD;  Location: [facility];  Service: Gastroenterology;;   INGUINAL HERNIA REPAIR Left [place] N/A 03/28/2016   Procedure: INSERTION OF MESH;  Surgeon: Rolm Bookbinder, MD;  Location: [place];  Service: General;  Laterality: N/A;   MELANOMA EXCISION     POLYPECTOMY  01/05/2022   Procedure: POLYPECTOMY;  Surgeon: Milus Banister, MD;  Location: [facility];  Service: Gastroenterology;;    POSTERIOR CERVICAL LAMINECTOMY Right 10/07/2013   Procedure: POSTERIOR CERVICAL LAMINECTOMY RIGHT CERVICAL SEVEN -THORACIC ONE FORAMINOTOMY;  Surgeon: Faythe Ghee, MD;  Location: [facility];  Service: Neurosurgery;  Laterality: Right;   POSTERIOR LUMBAR FUSION  X 7   SPINAL CORD STIMULATOR IMPLANT  ~ 2012   SPINAL CORD STIMULATOR REMOVAL N/A 09/19/2012   Procedure: LUMBAR SPINAL CORD STIMULATOR REMOVAL;  Surgeon: Faythe Ghee, MD;  Location: [place] [facility];  Service: Neurosurgery;  Laterality: N/A;   TONSILLECTOMY     ULNAR NERVE TRANSPOSITION Right 07/15/2015   supercharged AIN to deep motor branch ulnar nerve nerve transfer   UMBILICAL HERNIA REPAIR N/A 03/28/2016   Procedure: LAPAROSCOPIC UMBILICAL HERNIA;  Surgeon: Rolm Bookbinder, MD;  Location: [place] OR;  Service: General;  Laterality: N/A;    Current Medications: Current Meds  Medication Sig   allopurinol (ZYLOPRIM) 100 MG tablet Take 200 mg by mouth daily.   HYDROcodone-acetaminophen (NORCO/VICODIN) 5-325 MG tablet Take 1 tablet by mouth every 6 (six) hours as needed for moderate pain.   lisinopril-hydrochlorothiazide (PRINZIDE,ZESTORETIC) 20-12.5 MG tablet Take 1 tablet by mouth daily.   methocarbamol (ROBAXIN) 500 MG tablet Take 500 mg by mouth every 6 (six) hours as needed for muscle spasms.   RESTASIS 0.05 % ophthalmic emulsion Place 1 drop into both eyes 2 (two) times daily.   rosuvastatin (CRESTOR) 20 MG tablet Take 1 tablet (20 mg total) by mouth daily.     Allergies:   Patient has no known allergies.   Social History   Socioeconomic History   Marital status: Divorced    Spouse name: Not on file   Number of children: Not on file   Years of education: Not on file   Highest education level: Not on file  Occupational History   Not on file  Tobacco Use   Smoking status: Never   Smokeless tobacco: Never  Vaping Use   Vaping Use: Never used  Substance and Sexual Activity   Alcohol use: Yes    Alcohol/week: 1.0 -  2.0 standard drink of alcohol    Types: 1 - 2 Cans of beer per week   Drug use: No   Sexual activity: Not Currently  Other Topics Concern   Not on file  Social History Narrative   Not on file   Social Determinants of Health   Financial Resource Strain: Not on file  Food Insecurity: Not on file  Transportation Needs: Not on file  Physical Activity: Not on file  Stress: Not on file  Social Connections: Not on file     Family History: The patient's family history includes Alzheimer's disease in his father; Cancer in his mother. There is no history of Colon cancer, Colon  polyps, Esophageal cancer, Stomach cancer, or Rectal cancer.  ROS:   Please see the history of present illness.    + back and hip pain + occasional LE edema All other systems reviewed and are negative.  Labs/Other Studies Reviewed:    The following studies were reviewed today:  Echo 06/23/21   1. Left ventricular ejection fraction, by estimation, is 60 to 65%. The  left ventricle has normal function. The left ventricle has no regional  wall motion abnormalities. Left ventricular diastolic parameters were  normal.   2. Right ventricular systolic function is normal. The right ventricular  size is normal.   3. The mitral valve is normal in structure. No evidence of mitral valve  regurgitation. No evidence of mitral stenosis.   4. The aortic valve is calcified. There is moderate calcification of the  aortic valve. There is moderate thickening of the aortic valve. Aortic  valve regurgitation is not visualized. Mild aortic valve stenosis. Aortic  valve area, by VTI measures 1.32  cm. Aortic valve mean gradient measures 12.0 mmHg. Aortic valve Vmax  measures 2.42 m/s.   Echo 03/11/18   Left ventricle: The cavity size was normal. Wall thickness was    normal. Systolic function was normal. The estimated ejection    fraction was in the range of 55% to 60%. Wall motion was normal;    there were no regional  wall motion abnormalities.  - Aortic valve: There was mild stenosis. Valve area (VTI): 1.7    cm^2. Valve area (Vmax): 1.67 cm^2. Valve area (Vmean): 1.78    cm^2.  - Pulmonary arteries: PA peak pressure: 31 mm Hg (S).   Recent Labs: 09/20/2021: ALT 41  Recent Lipid Panel    Component Value Date/Time   CHOL 148 09/20/2021 1028   TRIG 61 09/20/2021 1028   HDL 65 09/20/2021 1028   CHOLHDL 2.3 09/20/2021 1028   LDLCALC 70 09/20/2021 1028     Risk Assessment/Calculations:       Physical Exam:    VS:  BP 130/74   Pulse 68   Ht '5\' 6"'$  (1.676 m)   Wt 202 lb 6.4 oz (91.8 kg)   SpO2 95%   BMI 32.67 kg/m     Wt Readings from Last 3 Encounters:  02/24/22 202 lb 6.4 oz (91.8 kg)  01/05/22 196 lb (88.9 kg)  11/23/21 195 lb (88.5 kg)     GEN:  Well nourished, well developed in no acute distress HEENT: Normal NECK: No JVD; No carotid bruits CARDIAC: RRR. 3/6 systolic murmur. No rubs, gallops RESPIRATORY:  Clear to auscultation without rales, wheezing or rhonchi  ABDOMEN: Soft, non-tender, non-distended MUSCULOSKELETAL:  No edema; No deformity. 2+ pedal pulses, equal bilaterally SKIN: Warm and dry NEUROLOGIC:  Alert and oriented x 3 PSYCHIATRIC:  Normal affect   EKG:  EKG is ordered today. The ekg ordered today demonstrates sinus rhythm at 68 bpm with occasional PVC  Diagnoses:    1. Preoperative cardiovascular examination   2. Nonrheumatic aortic valve stenosis   3. PVC's (premature ventricular contractions)   4. Hyperlipidemia LDL goal <70    Assessment and Plan:     Preoperative cardiac evaluation: The patient is doing well from a cardiac perspective. Therefore, based on ACC/AHA guidelines, the patient would be at acceptable risk for the planned procedure without further cardiovascular testing. He was advised that if he develops new symptoms prior to surgery to contact our office to arrange for a follow-up visit, and he verbalized understanding.  According to the Revised  Cardiac Risk Index (RCRI), his Perioperative Risk of Major Cardiac Event is (%): 6.6. His Functional Capacity in METs is: 5.62 according to the [facility] Activity Status Index (DASI).  PVC: Occasional PVC noted on EKG today. Provided education on PVCs. LV function is normal on echo 06/2021. He is asymptomatic, unaware.   Bilateral LE edema: He reports occasional bilateral LE edema.  Legs do not appear swollen today. He denies dyspnea, orthopnea, PND.  Feels that this may have started in conjunction with right hip pain.  Advised him to follow low sodium diet and provided information on DASH diet  Aortic valve stenosis: Moderate thickening of aortic valve with mild stenosis by echo 06/2021. He denies symptoms of lightheadedness, presyncope, syncope, chest pain, or dyspnea. We discussed repeating the echocardiogram in approximately 6-12 months, sooner if clinically indicated.  He does not have any further concerns today.  He will follow-up with Dr. Irish Lack in 5 months  Hyperlipidemia: LDL 70 09/2021.  Continue rosuvastatin. Is having cholesterol tested next week with PCP.   Hypertension: BP is well controlled. Continue lisinopril-HCTZ. Has lab work next week with PCP.      Disposition: 5 months with Dr. Irish Lack  Medication Adjustments/Labs and Tests Ordered: Current medicines are reviewed at length with the patient today.  Concerns regarding medicines are outlined above.  Orders Placed This Encounter  Procedures   EKG 12-Lead   No orders of the defined types were placed in this encounter.   Patient Instructions  Medication Instructions:   Your physician recommends that you continue on your current medications as directed. Please refer to the Current Medication list given to you today.   *If you need a refill on your cardiac medications before your next appointment, please call your pharmacy*   Lab Work:  None ordered.  If you have labs (blood work) drawn today and your tests are  completely normal, you will receive your results only by: [place] (if you have MyChart) OR A paper copy in the mail If you have any lab test that is abnormal or we need to change your treatment, we will call you to review the results.   Testing/Procedures:   None ordered.   Follow-Up: At [facility], you and your health needs are our priority.  As part of our continuing mission to provide you with exceptional heart care, we have created designated Provider Care Teams.  These Care Teams include your primary Cardiologist (physician) and Advanced Practice Providers (APPs -  Physician Assistants and Nurse Practitioners) who all work together to provide you with the care you need, when you need it.  We recommend signing up for the patient portal called "MyChart".  Sign up information is provided on this After Visit Summary.  MyChart is used to connect with patients for Virtual Visits (Telemedicine).  Patients are able to view lab/test results, encounter notes, upcoming appointments, etc.  Non-urgent messages can be sent to your provider as well.   To learn more about what you can do with MyChart, go to [email or web address].    Your next appointment:   5 month(s)  The format for your next appointment:   In Person  Provider:   Dr. Casandra Doffing  If primary card or EP is not listed click here to update    :1  DASH Eating Plan DASH stands for Dietary Approaches to Stop Hypertension. The DASH eating plan is a healthy eating plan that has been shown to: Reduce high blood pressure (  hypertension). Reduce your risk for type 2 diabetes, heart disease, and stroke. Help with weight loss. What are tips for following this plan? Reading food labels Check food labels for the amount of salt (sodium) per serving. Choose foods with less than 5 percent of the Daily Value of sodium. Generally, foods with less than 300 milligrams (mg) of sodium per serving fit into this eating plan. To find  whole grains, look for the word "whole" as the first word in the ingredient list. Shopping Buy products labeled as "low-sodium" or "no salt added." Buy fresh foods. Avoid canned foods and pre-made or frozen meals. Cooking Avoid adding salt when cooking. Use salt-free seasonings or herbs instead of table salt or sea salt. Check with your health care provider or pharmacist before using salt substitutes. Do not fry foods. Cook foods using healthy methods such as baking, boiling, grilling, roasting, and broiling instead. Cook with heart-healthy oils, such as olive, canola, avocado, soybean, or sunflower oil. Meal planning  Eat a balanced diet that includes: 4 or more servings of fruits and 4 or more servings of vegetables each day. Try to fill one-half of your plate with fruits and vegetables. 6-8 servings of whole grains each day. Less than 6 oz (170 g) of lean meat, poultry, or fish each day. A 3-oz (85-g) serving of meat is about the same size as a deck of cards. One egg equals 1 oz (28 g). 2-3 servings of low-fat dairy each day. One serving is 1 cup (237 mL). 1 serving of nuts, seeds, or beans 5 times each week. 2-3 servings of heart-healthy fats. Healthy fats called omega-3 fatty acids are found in foods such as walnuts, flaxseeds, fortified milks, and eggs. These fats are also found in cold-water fish, such as sardines, salmon, and mackerel. Limit how much you eat of: Canned or prepackaged foods. Food that is high in trans fat, such as some fried foods. Food that is high in saturated fat, such as fatty meat. Desserts and other sweets, sugary drinks, and other foods with added sugar. Full-fat dairy products. Do not salt foods before eating. Do not eat more than 4 egg yolks a week. Try to eat at least 2 vegetarian meals a week. Eat more home-cooked food and less restaurant, buffet, and fast food. Lifestyle When eating at a restaurant, ask that your food be prepared with less salt or no  salt, if possible. If you drink alcohol: Limit how much you use to: 0-1 drink a day for women who are not pregnant. 0-2 drinks a day for men. Be aware of how much alcohol is in your drink. In the [place] one drink equals one 12 oz bottle of beer (355 mL), one 5 oz glass of wine (148 mL), or one 1 oz glass of hard liquor (44 mL). General information Avoid eating more than 2,300 mg of salt a day. If you have hypertension, you may need to reduce your sodium intake to 1,500 mg a day. Work with your health care provider to maintain a healthy body weight or to lose weight. Ask what an ideal weight is for you. Get at least 30 minutes of exercise that causes your heart to beat faster (aerobic exercise) most days of the week. Activities may include walking, swimming, or biking. Work with your health care provider or dietitian to adjust your eating plan to your individual calorie needs. What foods should I eat? Fruits All fresh, dried, or frozen fruit. Canned fruit in natural juice (without added  sugar). Vegetables Fresh or frozen vegetables (raw, steamed, roasted, or grilled). Low-sodium or reduced-sodium tomato and vegetable juice. Low-sodium or reduced-sodium tomato sauce and tomato paste. Low-sodium or reduced-sodium canned vegetables. Grains Whole-grain or whole-wheat bread. Whole-grain or whole-wheat pasta. Brown rice. [place]. Bulgur. Whole-grain and low-sodium cereals. Pita bread. Low-fat, low-sodium crackers. Whole-wheat flour tortillas. Meats and other proteins Skinless chicken or [place]. Ground chicken or [place]. Pork with fat trimmed off. Fish and seafood. Egg whites. Dried beans, peas, or lentils. Unsalted nuts, nut butters, and seeds. Unsalted canned beans. Lean cuts of beef with fat trimmed off. Low-sodium, lean precooked or cured meat, such as sausages or meat loaves. Dairy Low-fat (1%) or fat-free (skim) milk. Reduced-fat, low-fat, or fat-free cheeses. Nonfat, low-sodium ricotta or  cottage cheese. Low-fat or nonfat yogurt. Low-fat, low-sodium cheese. Fats and oils Soft margarine without trans fats. Vegetable oil. Reduced-fat, low-fat, or light mayonnaise and salad dressings (reduced-sodium). Canola, safflower, olive, avocado, soybean, and sunflower oils. Avocado. Seasonings and condiments Herbs. Spices. Seasoning mixes without salt. Other foods Unsalted popcorn and pretzels. Fat-free sweets. The items listed above may not be a complete list of foods and beverages you can eat. Contact a dietitian for more information. What foods should I avoid? Fruits Canned fruit in a light or heavy syrup. Fried fruit. Fruit in cream or butter sauce. Vegetables Creamed or fried vegetables. Vegetables in a cheese sauce. Regular canned vegetables (not low-sodium or reduced-sodium). Regular canned tomato sauce and paste (not low-sodium or reduced-sodium). Regular tomato and vegetable juice (not low-sodium or reduced-sodium). Angie Fava. Olives. Grains Baked goods made with fat, such as croissants, muffins, or some breads. Dry pasta or rice meal packs. Meats and other proteins Fatty cuts of meat. Ribs. Fried meat. Berniece Salines. Bologna, salami, and other precooked or cured meats, such as sausages or meat loaves. Fat from the back of a pig (fatback). Bratwurst. Salted nuts and seeds. Canned beans with added salt. Canned or smoked fish. Whole eggs or egg yolks. Chicken or [place] with skin. Dairy Whole or 2% milk, cream, and half-and-half. Whole or full-fat cream cheese. Whole-fat or sweetened yogurt. Full-fat cheese. Nondairy creamers. Whipped toppings. Processed cheese and cheese spreads. Fats and oils Butter. Stick margarine. Lard. Shortening. Ghee. Bacon fat. Tropical oils, such as coconut, palm kernel, or palm oil. Seasonings and condiments Onion salt, garlic salt, seasoned salt, table salt, and sea salt. Worcestershire sauce. Tartar sauce. Barbecue sauce. Teriyaki sauce. Soy sauce, including  reduced-sodium. Steak sauce. Canned and packaged gravies. Fish sauce. Oyster sauce. Cocktail sauce. Store-bought horseradish. Ketchup. Mustard. Meat flavorings and tenderizers. Bouillon cubes. Hot sauces. Pre-made or packaged marinades. Pre-made or packaged taco seasonings. Relishes. Regular salad dressings. Other foods Salted popcorn and pretzels. The items listed above may not be a complete list of foods and beverages you should avoid. Contact a dietitian for more information. Where to find more information National Heart, Lung, and Blood Institute: https://wilson-eaton.com/ American Heart Association: www.heart.org Academy of Nutrition and Dietetics: www.eatright.[place]: www.kidney.org Summary The DASH eating plan is a healthy eating plan that has been shown to reduce high blood pressure (hypertension). It may also reduce your risk for type 2 diabetes, heart disease, and stroke. When on the DASH eating plan, aim to eat more fresh fruits and vegetables, whole grains, lean proteins, low-fat dairy, and heart-healthy fats. With the DASH eating plan, you should limit salt (sodium) intake to 2,300 mg a day. If you have hypertension, you may need to reduce your sodium intake to 1,500 mg  a day. Work with your health care provider or dietitian to adjust your eating plan to your individual calorie needs. This information is not intended to replace advice given to you by your health care provider. Make sure you discuss any questions you have with your health care provider. Document Revised: 07/04/2019 Document Reviewed: 07/04/2019 Elsevier Patient Education  [place]. }    Important Information About Payton Mccallum, NP  02/24/2022 8:26 AM    [facility]

## 2022-02-23 DIAGNOSIS — M25551 Pain in right hip: Secondary | ICD-10-CM | POA: Diagnosis not present

## 2022-02-23 DIAGNOSIS — Z6832 Body mass index (BMI) 32.0-32.9, adult: Secondary | ICD-10-CM | POA: Diagnosis not present

## 2022-02-24 ENCOUNTER — Ambulatory Visit: Payer: Medicare HMO | Admitting: Nurse Practitioner

## 2022-02-24 ENCOUNTER — Encounter: Payer: Self-pay | Admitting: Nurse Practitioner

## 2022-02-24 VITALS — BP 130/74 | HR 68 | Ht 66.0 in | Wt 202.4 lb

## 2022-02-24 DIAGNOSIS — R600 Localized edema: Secondary | ICD-10-CM

## 2022-02-24 DIAGNOSIS — I350 Nonrheumatic aortic (valve) stenosis: Secondary | ICD-10-CM

## 2022-02-24 DIAGNOSIS — I10 Essential (primary) hypertension: Secondary | ICD-10-CM

## 2022-02-24 DIAGNOSIS — I493 Ventricular premature depolarization: Secondary | ICD-10-CM | POA: Diagnosis not present

## 2022-02-24 DIAGNOSIS — E785 Hyperlipidemia, unspecified: Secondary | ICD-10-CM

## 2022-02-24 DIAGNOSIS — Z01810 Encounter for preprocedural cardiovascular examination: Secondary | ICD-10-CM

## 2022-02-24 NOTE — Patient Instructions (Addendum)
Medication Instructions:   Your physician recommends that you continue on your current medications as directed. Please refer to the Current Medication list given to you today.   *If you need a refill on your cardiac medications before your next appointment, please call your pharmacy*   Lab Work:  None ordered.  If you have labs (blood work) drawn today and your tests are completely normal, you will receive your results only by: [place] (if you have MyChart) OR A paper copy in the mail If you have any lab test that is abnormal or we need to change your treatment, we will call you to review the results.   Testing/Procedures:   None ordered.   Follow-Up: At [facility], you and your health needs are our priority.  As part of our continuing mission to provide you with exceptional heart care, we have created designated Provider Care Teams.  These Care Teams include your primary Cardiologist (physician) and Advanced Practice Providers (APPs -  Physician Assistants and Nurse Practitioners) who all work together to provide you with the care you need, when you need it.  We recommend signing up for the patient portal called "MyChart".  Sign up information is provided on this After Visit Summary.  MyChart is used to connect with patients for Virtual Visits (Telemedicine).  Patients are able to view lab/test results, encounter notes, upcoming appointments, etc.  Non-urgent messages can be sent to your provider as well.   To learn more about what you can do with MyChart, go to [email or web address].    Your next appointment:   5 month(s)  The format for your next appointment:   In Person  Provider:   Dr. Casandra Doffing  If primary card or EP is not listed click here to update    :1  DASH Eating Plan DASH stands for Dietary Approaches to Stop Hypertension. The DASH eating plan is a healthy eating plan that has been shown to: Reduce high blood pressure (hypertension). Reduce  your risk for type 2 diabetes, heart disease, and stroke. Help with weight loss. What are tips for following this plan? Reading food labels Check food labels for the amount of salt (sodium) per serving. Choose foods with less than 5 percent of the Daily Value of sodium. Generally, foods with less than 300 milligrams (mg) of sodium per serving fit into this eating plan. To find whole grains, look for the word "whole" as the first word in the ingredient list. Shopping Buy products labeled as "low-sodium" or "no salt added." Buy fresh foods. Avoid canned foods and pre-made or frozen meals. Cooking Avoid adding salt when cooking. Use salt-free seasonings or herbs instead of table salt or sea salt. Check with your health care provider or pharmacist before using salt substitutes. Do not fry foods. Cook foods using healthy methods such as baking, boiling, grilling, roasting, and broiling instead. Cook with heart-healthy oils, such as olive, canola, avocado, soybean, or sunflower oil. Meal planning  Eat a balanced diet that includes: 4 or more servings of fruits and 4 or more servings of vegetables each day. Try to fill one-half of your plate with fruits and vegetables. 6-8 servings of whole grains each day. Less than 6 oz (170 g) of lean meat, poultry, or fish each day. A 3-oz (85-g) serving of meat is about the same size as a deck of cards. One egg equals 1 oz (28 g). 2-3 servings of low-fat dairy each day. One serving is 1 cup (237 mL). 1  serving of nuts, seeds, or beans 5 times each week. 2-3 servings of heart-healthy fats. Healthy fats called omega-3 fatty acids are found in foods such as walnuts, flaxseeds, fortified milks, and eggs. These fats are also found in cold-water fish, such as sardines, salmon, and mackerel. Limit how much you eat of: Canned or prepackaged foods. Food that is high in trans fat, such as some fried foods. Food that is high in saturated fat, such as fatty  meat. Desserts and other sweets, sugary drinks, and other foods with added sugar. Full-fat dairy products. Do not salt foods before eating. Do not eat more than 4 egg yolks a week. Try to eat at least 2 vegetarian meals a week. Eat more home-cooked food and less restaurant, buffet, and fast food. Lifestyle When eating at a restaurant, ask that your food be prepared with less salt or no salt, if possible. If you drink alcohol: Limit how much you use to: 0-1 drink a day for women who are not pregnant. 0-2 drinks a day for men. Be aware of how much alcohol is in your drink. In the [place] one drink equals one 12 oz bottle of beer (355 mL), one 5 oz glass of wine (148 mL), or one 1 oz glass of hard liquor (44 mL). General information Avoid eating more than 2,300 mg of salt a day. If you have hypertension, you may need to reduce your sodium intake to 1,500 mg a day. Work with your health care provider to maintain a healthy body weight or to lose weight. Ask what an ideal weight is for you. Get at least 30 minutes of exercise that causes your heart to beat faster (aerobic exercise) most days of the week. Activities may include walking, swimming, or biking. Work with your health care provider or dietitian to adjust your eating plan to your individual calorie needs. What foods should I eat? Fruits All fresh, dried, or frozen fruit. Canned fruit in natural juice (without added sugar). Vegetables Fresh or frozen vegetables (raw, steamed, roasted, or grilled). Low-sodium or reduced-sodium tomato and vegetable juice. Low-sodium or reduced-sodium tomato sauce and tomato paste. Low-sodium or reduced-sodium canned vegetables. Grains Whole-grain or whole-wheat bread. Whole-grain or whole-wheat pasta. Brown rice. [place]. Bulgur. Whole-grain and low-sodium cereals. Pita bread. Low-fat, low-sodium crackers. Whole-wheat flour tortillas. Meats and other proteins Skinless chicken or [place]. Ground  chicken or [place]. Pork with fat trimmed off. Fish and seafood. Egg whites. Dried beans, peas, or lentils. Unsalted nuts, nut butters, and seeds. Unsalted canned beans. Lean cuts of beef with fat trimmed off. Low-sodium, lean precooked or cured meat, such as sausages or meat loaves. Dairy Low-fat (1%) or fat-free (skim) milk. Reduced-fat, low-fat, or fat-free cheeses. Nonfat, low-sodium ricotta or cottage cheese. Low-fat or nonfat yogurt. Low-fat, low-sodium cheese. Fats and oils Soft margarine without trans fats. Vegetable oil. Reduced-fat, low-fat, or light mayonnaise and salad dressings (reduced-sodium). Canola, safflower, olive, avocado, soybean, and sunflower oils. Avocado. Seasonings and condiments Herbs. Spices. Seasoning mixes without salt. Other foods Unsalted popcorn and pretzels. Fat-free sweets. The items listed above may not be a complete list of foods and beverages you can eat. Contact a dietitian for more information. What foods should I avoid? Fruits Canned fruit in a light or heavy syrup. Fried fruit. Fruit in cream or butter sauce. Vegetables Creamed or fried vegetables. Vegetables in a cheese sauce. Regular canned vegetables (not low-sodium or reduced-sodium). Regular canned tomato sauce and paste (not low-sodium or reduced-sodium). Regular tomato and vegetable juice (  not low-sodium or reduced-sodium). Angie Fava. Olives. Grains Baked goods made with fat, such as croissants, muffins, or some breads. Dry pasta or rice meal packs. Meats and other proteins Fatty cuts of meat. Ribs. Fried meat. Berniece Salines. Bologna, salami, and other precooked or cured meats, such as sausages or meat loaves. Fat from the back of a pig (fatback). Bratwurst. Salted nuts and seeds. Canned beans with added salt. Canned or smoked fish. Whole eggs or egg yolks. Chicken or [place] with skin. Dairy Whole or 2% milk, cream, and half-and-half. Whole or full-fat cream cheese. Whole-fat or sweetened yogurt. Full-fat  cheese. Nondairy creamers. Whipped toppings. Processed cheese and cheese spreads. Fats and oils Butter. Stick margarine. Lard. Shortening. Ghee. Bacon fat. Tropical oils, such as coconut, palm kernel, or palm oil. Seasonings and condiments Onion salt, garlic salt, seasoned salt, table salt, and sea salt. Worcestershire sauce. Tartar sauce. Barbecue sauce. Teriyaki sauce. Soy sauce, including reduced-sodium. Steak sauce. Canned and packaged gravies. Fish sauce. Oyster sauce. Cocktail sauce. Store-bought horseradish. Ketchup. Mustard. Meat flavorings and tenderizers. Bouillon cubes. Hot sauces. Pre-made or packaged marinades. Pre-made or packaged taco seasonings. Relishes. Regular salad dressings. Other foods Salted popcorn and pretzels. The items listed above may not be a complete list of foods and beverages you should avoid. Contact a dietitian for more information. Where to find more information National Heart, Lung, and Blood Institute: https://wilson-eaton.com/ American Heart Association: www.heart.org Academy of Nutrition and Dietetics: www.eatright.[place]: www.kidney.org Summary The DASH eating plan is a healthy eating plan that has been shown to reduce high blood pressure (hypertension). It may also reduce your risk for type 2 diabetes, heart disease, and stroke. When on the DASH eating plan, aim to eat more fresh fruits and vegetables, whole grains, lean proteins, low-fat dairy, and heart-healthy fats. With the DASH eating plan, you should limit salt (sodium) intake to 2,300 mg a day. If you have hypertension, you may need to reduce your sodium intake to 1,500 mg a day. Work with your health care provider or dietitian to adjust your eating plan to your individual calorie needs. This information is not intended to replace advice given to you by your health care provider. Make sure you discuss any questions you have with your health care provider. Document Revised: 07/04/2019  Document Reviewed: 07/04/2019 Elsevier Patient Education  [place]. }    Important Information About Sugar

## 2022-03-07 DIAGNOSIS — M109 Gout, unspecified: Secondary | ICD-10-CM | POA: Diagnosis not present

## 2022-03-07 DIAGNOSIS — D126 Benign neoplasm of colon, unspecified: Secondary | ICD-10-CM | POA: Diagnosis not present

## 2022-03-07 DIAGNOSIS — Z125 Encounter for screening for malignant neoplasm of prostate: Secondary | ICD-10-CM | POA: Diagnosis not present

## 2022-03-07 DIAGNOSIS — Z0000 Encounter for general adult medical examination without abnormal findings: Secondary | ICD-10-CM | POA: Diagnosis not present

## 2022-03-07 DIAGNOSIS — M5136 Other intervertebral disc degeneration, lumbar region: Secondary | ICD-10-CM | POA: Diagnosis not present

## 2022-03-07 DIAGNOSIS — Z85820 Personal history of malignant melanoma of skin: Secondary | ICD-10-CM | POA: Diagnosis not present

## 2022-03-07 DIAGNOSIS — M150 Primary generalized (osteo)arthritis: Secondary | ICD-10-CM | POA: Diagnosis not present

## 2022-03-07 DIAGNOSIS — E785 Hyperlipidemia, unspecified: Secondary | ICD-10-CM | POA: Diagnosis not present

## 2022-03-07 DIAGNOSIS — I10 Essential (primary) hypertension: Secondary | ICD-10-CM | POA: Diagnosis not present

## 2022-03-08 DIAGNOSIS — M79641 Pain in right hand: Secondary | ICD-10-CM | POA: Diagnosis not present

## 2022-03-08 DIAGNOSIS — M65321 Trigger finger, right index finger: Secondary | ICD-10-CM | POA: Diagnosis not present

## 2022-03-08 DIAGNOSIS — M25531 Pain in right wrist: Secondary | ICD-10-CM | POA: Diagnosis not present

## 2022-03-08 DIAGNOSIS — M7712 Lateral epicondylitis, left elbow: Secondary | ICD-10-CM | POA: Diagnosis not present

## 2022-03-10 DIAGNOSIS — M25552 Pain in left hip: Secondary | ICD-10-CM | POA: Diagnosis not present

## 2022-03-10 DIAGNOSIS — M4856XA Collapsed vertebra, not elsewhere classified, lumbar region, initial encounter for fracture: Secondary | ICD-10-CM | POA: Diagnosis not present

## 2022-03-10 DIAGNOSIS — M25551 Pain in right hip: Secondary | ICD-10-CM | POA: Diagnosis not present

## 2022-03-14 ENCOUNTER — Other Ambulatory Visit: Payer: Self-pay | Admitting: Family Medicine

## 2022-03-14 DIAGNOSIS — R748 Abnormal levels of other serum enzymes: Secondary | ICD-10-CM

## 2022-03-20 ENCOUNTER — Ambulatory Visit
Admission: RE | Admit: 2022-03-20 | Discharge: 2022-03-20 | Disposition: A | Discharge status: Home / Self Care | Payer: Medicare HMO | Source: Ambulatory Visit | Attending: Family Medicine | Admitting: Family Medicine

## 2022-03-20 DIAGNOSIS — R945 Abnormal results of liver function studies: Secondary | ICD-10-CM | POA: Diagnosis not present

## 2022-03-20 DIAGNOSIS — R748 Abnormal levels of other serum enzymes: Secondary | ICD-10-CM

## 2022-03-29 DIAGNOSIS — M65341 Trigger finger, right ring finger: Secondary | ICD-10-CM | POA: Diagnosis not present

## 2022-03-29 DIAGNOSIS — M65322 Trigger finger, left index finger: Secondary | ICD-10-CM | POA: Diagnosis not present

## 2022-03-29 DIAGNOSIS — M65321 Trigger finger, right index finger: Secondary | ICD-10-CM | POA: Diagnosis not present

## 2022-03-29 DIAGNOSIS — M6530 Trigger finger, unspecified finger: Secondary | ICD-10-CM | POA: Diagnosis not present

## 2022-03-29 DIAGNOSIS — M65332 Trigger finger, left middle finger: Secondary | ICD-10-CM | POA: Diagnosis not present

## 2022-03-29 DIAGNOSIS — Z4789 Encounter for other orthopedic aftercare: Secondary | ICD-10-CM | POA: Diagnosis not present

## 2022-03-29 DIAGNOSIS — S52501D Unspecified fracture of the lower end of right radius, subsequent encounter for closed fracture with routine healing: Secondary | ICD-10-CM | POA: Diagnosis not present

## 2022-03-29 DIAGNOSIS — M25531 Pain in right wrist: Secondary | ICD-10-CM | POA: Diagnosis not present

## 2022-04-06 DIAGNOSIS — S32049A Unspecified fracture of fourth lumbar vertebra, initial encounter for closed fracture: Secondary | ICD-10-CM | POA: Diagnosis not present

## 2022-04-11 NOTE — H&P (Addendum)
TOTAL HIP ADMISSION H&P  Patient is admitted for right total hip arthroplasty.  Subjective:  Chief Complaint: Right hip pain  HPI: Anthony Hebert, 71 y.o. male, has a history of pain and functional disability in the right hip due to arthritis and patient has failed non-surgical conservative treatments for greater than 12 weeks to include use of assistive devices and activity modification. Onset of symptoms was gradual, starting  several  years ago with gradually worsening course since that time. The patient noted no past surgery on the right hip. Patient currently rates pain in the right hip at 7 out of 10 with activity. Patient has night pain, worsening of pain with activity and weight bearing, pain that interfers with activities of daily living, and pain with passive range of motion. Patient has evidence of periarticular osteophytes and joint space narrowing by imaging studies. This condition presents safety issues increasing the risk of falls. There is no current active infection.  Patient Active Problem List   Diagnosis Date Noted   Ventral hernia 03/28/2016   Ulnar neuropathy of right upper extremity 07/15/2015   Aortic sclerosis 09/24/2014   HLD (hyperlipidemia) 09/21/2014   Cervical spondylosis with radiculopathy 10/07/2013   Coronary atherosclerosis of native coronary artery 07/28/2013   Mixed hyperlipidemia 07/28/2013   Heart murmur    Hypertension    GERD (gastroesophageal reflux disease)    Cancer (HCC)     Past Medical History:  Diagnosis Date   Acute idiopathic gout of right ankle    Aortic valve sclerosis    Arthritis    "all over"   Chronic back pain    "all over"   Complication of anesthesia    slow to wake up at times   DDD (degenerative disc disease), lumbar    Difficult intubation    had trouble with intubation shape of throat;last sx no problems per pt; 03/28/16 successful oral ETT with green Glidescope, stylet. Has limited oral opening/small glottic opening    Foot swelling    in [place] 07/2013   GERD (gastroesophageal reflux disease)    occ   Gout    Gout of left foot    Heart murmur    states no problems   History of melanoma    Hypercholesterolemia    Hyperlipidemia    Hypertension    Melanoma ([place]) ~ 2010   "above left eye"   Nonrheumatic aortic valve stenosis    Other chronic pain    Primary osteoarthritis involving multiple joints    Rising PSA level    Screening for prostate cancer    Stroke [facility])    "maybe w/my 1st back OR in the 1990's"; denies residual on 07/15/2015    Past Surgical History:  Procedure Laterality Date   APPENDECTOMY     BACK SURGERY     CARPAL TUNNEL RELEASE Right 07/15/2015   w/GUYON'S CANAL RELEASE    CARPAL TUNNEL RELEASE Right 07/15/2015   Procedure: RIGHT FOREARM AND WRIST CARPAL TUNNEL RELEASE;  Surgeon: Roseanne Kaufman, MD;  Location: [place];  Service: Orthopedics;  Laterality: Right;   CARPAL TUNNEL RELEASE Left 03/22/2017   Procedure: OPEN CARPAL TUNNEL RELEASE;  Surgeon: Roseanne Kaufman, MD;  Location: [place];  Service: Orthopedics;  Laterality: Left;  60 mins   COLONOSCOPY     COLONOSCOPY WITH PROPOFOL N/A 01/05/2022   Procedure: COLONOSCOPY WITH PROPOFOL;  Surgeon: Milus Banister, MD;  Location: [facility];  Service: Gastroenterology;  Laterality: N/A;   FOOT SURGERY Right  cut nerve to relieve pain   HEMOSTASIS CLIP PLACEMENT  01/05/2022   Procedure: HEMOSTASIS CLIP PLACEMENT;  Surgeon: Milus Banister, MD;  Location: [facility];  Service: Gastroenterology;;   INGUINAL HERNIA REPAIR Left [place] N/A 03/28/2016   Procedure: INSERTION OF MESH;  Surgeon: Rolm Bookbinder, MD;  Location: [place];  Service: General;  Laterality: N/A;   MELANOMA EXCISION     POLYPECTOMY  01/05/2022   Procedure: POLYPECTOMY;  Surgeon: Milus Banister, MD;  Location: [facility];  Service: Gastroenterology;;   POSTERIOR CERVICAL LAMINECTOMY Right 10/07/2013   Procedure: POSTERIOR CERVICAL  LAMINECTOMY RIGHT CERVICAL SEVEN -THORACIC ONE FORAMINOTOMY;  Surgeon: Faythe Ghee, MD;  Location: [facility];  Service: Neurosurgery;  Laterality: Right;   POSTERIOR LUMBAR FUSION  X 7   SPINAL CORD STIMULATOR IMPLANT  ~ 2012   SPINAL CORD STIMULATOR REMOVAL N/A 09/19/2012   Procedure: LUMBAR SPINAL CORD STIMULATOR REMOVAL;  Surgeon: Faythe Ghee, MD;  Location: [place] [facility];  Service: Neurosurgery;  Laterality: N/A;   TONSILLECTOMY     ULNAR NERVE TRANSPOSITION Right 07/15/2015   supercharged AIN to deep motor branch ulnar nerve nerve transfer   UMBILICAL HERNIA REPAIR N/A 03/28/2016   Procedure: LAPAROSCOPIC UMBILICAL HERNIA;  Surgeon: Rolm Bookbinder, MD;  Location: [place];  Service: General;  Laterality: N/A;    Prior to Admission medications   Medication Sig Start Date End Date Taking? Authorizing Provider  allopurinol (ZYLOPRIM) 100 MG tablet Take 200 mg by mouth daily.    [provider]  COLCRYS 0.6 MG tablet Take 0.6 mg by mouth every 8 (eight) hours as needed (gout flares). Patient not taking: Reported on 02/24/2022 03/08/16   [provider]  HYDROcodone-acetaminophen (NORCO/VICODIN) 5-325 MG tablet Take 1 tablet by mouth every 6 (six) hours as needed for moderate pain.    [provider]  lisinopril-hydrochlorothiazide (PRINZIDE,ZESTORETIC) 20-12.5 MG tablet Take 1 tablet by mouth daily.    [provider]  methocarbamol (ROBAXIN) 500 MG tablet Take 500 mg by mouth every 6 (six) hours as needed for muscle spasms.    [provider]  RESTASIS 0.05 % ophthalmic emulsion Place 1 drop into both eyes 2 (two) times daily. 09/21/21   [provider]  rosuvastatin (CRESTOR) 20 MG tablet Take 1 tablet (20 mg total) by mouth daily. 06/06/21   Jettie Booze, MD    No Known Allergies  Social History   Socioeconomic History   Marital status: Divorced    Spouse name: Not on file   Number of children: Not on file   Years of  education: Not on file   Highest education level: Not on file  Occupational History   Not on file  Tobacco Use   Smoking status: Never   Smokeless tobacco: Never  Vaping Use   Vaping Use: Never used  Substance and Sexual Activity   Alcohol use: Yes    Alcohol/week: 1.0 - 2.0 standard drink of alcohol    Types: 1 - 2 Cans of beer per week   Drug use: No   Sexual activity: Not Currently  Other Topics Concern   Not on file  Social History Narrative   Not on file   Social Determinants of Health   Financial Resource Strain: Not on file  Food Insecurity: Not on file  Transportation Needs: Not on file  Physical Activity: Not on file  Stress: Not on file  Social Connections: Not on file  Intimate Partner Violence: Not on file    Tobacco Use: Low Risk  (02/24/2022)   Patient History    Smoking Tobacco Use: Never    Smokeless Tobacco Use: Never    Passive Exposure: Not on file   Social History   Substance and Sexual Activity  Alcohol Use Yes   Alcohol/week: 1.0 - 2.0 standard drink of alcohol   Types: 1 - 2 Cans of beer per week    Family History  Problem Relation Age of Onset   Cancer Mother    Alzheimer's disease Father    Colon cancer Neg Hx    Colon polyps Neg Hx    Esophageal cancer Neg Hx    Stomach cancer Neg Hx    Rectal cancer Neg Hx     Review of Systems  Constitutional:  Negative for chills and fever.  HENT:  Negative for congestion, sore throat and tinnitus.   Eyes:  Negative for double vision, photophobia and pain.  Respiratory:  Negative for cough, shortness of breath and wheezing.   Cardiovascular:  Negative for chest pain, palpitations and orthopnea.  Gastrointestinal:  Negative for heartburn, nausea and vomiting.  Genitourinary:  Negative for dysuria, frequency and urgency.  Musculoskeletal:  Positive for joint pain.  Neurological:  Negative for dizziness, weakness and headaches.     Objective:  Physical Exam: Well nourished and well  developed.  General: Alert and oriented x3, cooperative and pleasant, no acute distress.  Head: normocephalic, atraumatic, neck supple.  Eyes: EOMI.  Musculoskeletal:  Right Hip Exam:  The range of motion: Flexion to 100 degrees, Internal Rotation is minimal, External Rotation to 20 degrees, and abduction to 20 degrees without discomfort.  Calves soft and nontender. Motor function intact in LE. Strength 5/5 LE bilaterally. Neuro: Distal pulses 2+. Sensation to light touch intact in LE.  Imaging Review Plain radiographs demonstrate severe degenerative joint disease of the right hip. The bone quality appears to be adequate for age and reported activity level.  Assessment/Plan:  End stage arthritis, right hip  The patient history, physical examination, clinical judgement of the provider and imaging studies are consistent with end stage degenerative joint disease of the right hip and total hip arthroplasty is deemed medically necessary. The treatment options including medical management, injection therapy, arthroscopy and arthroplasty were discussed at length. The risks and benefits of total hip arthroplasty were presented and reviewed. The risks due to aseptic loosening, infection, stiffness, dislocation/subluxation, thromboembolic complications and other imponderables were discussed. The patient acknowledged the explanation, agreed to proceed with the plan and consent was signed. Patient is being admitted for inpatient treatment for surgery, pain control, PT, OT, prophylactic antibiotics, VTE prophylaxis, progressive ambulation and ADLs and discharge planning.The patient is planning to be discharged  home .   Patient's anticipated LOS is less than 2 midnights, meeting these requirements: - Lives within 1 hour of care - Has a competent adult at home to recover with post-op recover - NO history of  - Diabetes  - Coronary Artery Disease  - Heart failure  - Heart attack  - Stroke  -  DVT/VTE  - Cardiac arrhythmia  - Respiratory Failure/COPD  - Renal failure  - Anemia  - Advanced Liver disease  Therapy Plans: HEP Disposition: Home with family Planned DVT Prophylaxis: Aspirin 325 mg BID DME Needed: None PCP: Aura Dials, MD (clearance received) Cardiologist: Casandra Doffing, MD (clearance received) TXA: IV Allergies: None Anesthesia Concerns: Recent fall in 01/2022 with compression fracture at L4.  BMI: 33.2 Last HgbA1c: Not diabetic Pharmacy: [facility]  Other: - Hydrocodone 5-325 mg TID, will work on tapering down as tolerated - Hx of questionable stroke during back surgery 20+ years ago  - Patient was instructed on what medications to stop prior to surgery. - Follow-up visit in 2 weeks with Dr. Wynelle Link - Begin physical therapy following surgery - Pre-operative lab work as pre-surgical testing - Prescriptions will be provided in hospital at time of discharge  Theresa Duty, PA-C Orthopedic Surgery [facility] [place]

## 2022-04-12 DIAGNOSIS — H43811 Vitreous degeneration, right eye: Secondary | ICD-10-CM | POA: Diagnosis not present

## 2022-04-12 DIAGNOSIS — K7689 Other specified diseases of liver: Secondary | ICD-10-CM | POA: Diagnosis not present

## 2022-04-12 DIAGNOSIS — H02831 Dermatochalasis of right upper eyelid: Secondary | ICD-10-CM | POA: Diagnosis not present

## 2022-04-12 DIAGNOSIS — H43391 Other vitreous opacities, right eye: Secondary | ICD-10-CM | POA: Diagnosis not present

## 2022-04-12 DIAGNOSIS — H35342 Macular cyst, hole, or pseudohole, left eye: Secondary | ICD-10-CM | POA: Diagnosis not present

## 2022-04-13 NOTE — Progress Notes (Signed)
[facility] Note  04/21/2022     CHIEF COMPLAINT Patient presents for Retina Evaluation   HISTORY OF PRESENT ILLNESS: Anthony Hebert is a 71 y.o. male who presents to the clinic today for:   HPI     Retina Evaluation   In left eye.  This started months ago.  Associated Symptoms Floaters and Distortion.  Negative for Flashes.  Context:  distance vision, mid-range vision, near vision and reading.  I, the attending physician,  performed the HPI with the patient and updated documentation appropriately.        Comments   Patient is here today based on a referral from Dr. Lucita Ferrara for a mac hole in the left eye.      Last edited by Bernarda Caffey, MD on 04/21/2022 11:47 PM.    Pt is here on the referral of Dr. Lucita Ferrara for concern of mac hole OS, pt states he fell off a ladder on June 29th and fractured his back and wrist, pt had cataract sx with Dr. Talbert Forest and started seeing Dr. Lucita Ferrara for dry eye OD  Referring physician: Vevelyn Royals, MD [place],  [place] [id number]  HISTORICAL INFORMATION:   Selected notes from the MEDICAL RECORD NUMBER Referred by Dr. Lucita Ferrara for mac hole OS LEE:  Ocular Hx- PMH-    CURRENT MEDICATIONS: Current Outpatient Medications (Ophthalmic Drugs)  Medication Sig   Difluprednate 0.05 % EMUL Place 1 drop into the left eye 4 (four) times daily.   Polyvinyl Alcohol-Povidone (REFRESH OP) Place 1 drop into both eyes daily as needed (dry eyes).   PROLENSA 0.07 % SOLN Place 1 drop into the left eye 2 (two) times daily.   RESTASIS 0.05 % ophthalmic emulsion Place 1 drop into both eyes 2 (two) times daily.   No current facility-administered medications for this visit. (Ophthalmic Drugs)   Current Outpatient Medications (Other)  Medication Sig   allopurinol (ZYLOPRIM) 100 MG tablet Take 200 mg by mouth daily.   bismuth subsalicylate (PEPTO BISMOL) 262 MG chewable tablet Chew 262 mg by  mouth as needed for indigestion.   gabapentin (NEURONTIN) 100 MG capsule Take 100 mg by mouth 3 (three) times daily.   HYDROcodone-acetaminophen (NORCO/VICODIN) 5-325 MG tablet Take 1 tablet by mouth every 6 (six) hours as needed for moderate pain.   ibuprofen (ADVIL) 200 MG tablet Take 400 mg by mouth every 6 (six) hours as needed for moderate pain.   lisinopril (ZESTRIL) 20 MG tablet Take 20 mg by mouth daily.   methocarbamol (ROBAXIN) 500 MG tablet Take 500 mg by mouth every 6 (six) hours as needed for muscle spasms.   rosuvastatin (CRESTOR) 20 MG tablet Take 1 tablet (20 mg total) by mouth daily.   No current facility-administered medications for this visit. (Other)   REVIEW OF SYSTEMS: ROS   Positive for: Eyes Last edited by Annie Paras, COT on 04/21/2022  8:41 AM.     ALLERGIES No Known Allergies  PAST MEDICAL HISTORY Past Medical History:  Diagnosis Date   Acute idiopathic gout of right ankle    Aortic valve sclerosis    Arthritis    "all over"   Chronic back pain    "all over"   Complication of anesthesia    slow to wake up at times   DDD (degenerative disc disease), lumbar    Difficult intubation    had trouble with intubation shape of throat;last sx no problems per  pt; 03/28/16 successful oral ETT with green Glidescope, stylet. Has limited oral opening/small glottic opening   Foot swelling    in [place] 07/2013   GERD (gastroesophageal reflux disease)    occ   Gout    Gout of left foot    Heart murmur    states no problems   History of melanoma    Hypercholesterolemia    Hyperlipidemia    Hypertension    Melanoma ([place]) ~ 2010   "above left eye"   Nonrheumatic aortic valve stenosis    Other chronic pain    Primary osteoarthritis involving multiple joints    Rising PSA level    Screening for prostate cancer    Stroke [facility])    "maybe w/my 1st back OR in the 1990's"; denies residual on 07/15/2015   Past Surgical History:  Procedure Laterality Date    APPENDECTOMY     BACK SURGERY     CARPAL TUNNEL RELEASE Right 07/15/2015   w/GUYON'S CANAL RELEASE    CARPAL TUNNEL RELEASE Right 07/15/2015   Procedure: RIGHT FOREARM AND WRIST CARPAL TUNNEL RELEASE;  Surgeon: Roseanne Kaufman, MD;  Location: [facility];  Service: Orthopedics;  Laterality: Right;   CARPAL TUNNEL RELEASE Left 03/22/2017   Procedure: OPEN CARPAL TUNNEL RELEASE;  Surgeon: Roseanne Kaufman, MD;  Location: [facility];  Service: Orthopedics;  Laterality: Left;  60 mins   COLONOSCOPY     COLONOSCOPY WITH PROPOFOL N/A 01/05/2022   Procedure: COLONOSCOPY WITH PROPOFOL;  Surgeon: Milus Banister, MD;  Location: [facility];  Service: Gastroenterology;  Laterality: N/A;   FOOT SURGERY Right    cut nerve to relieve pain   HEMOSTASIS CLIP PLACEMENT  01/05/2022   Procedure: HEMOSTASIS CLIP PLACEMENT;  Surgeon: Milus Banister, MD;  Location: [facility];  Service: Gastroenterology;;   INGUINAL HERNIA REPAIR Left [place] N/A 03/28/2016   Procedure: INSERTION OF MESH;  Surgeon: Rolm Bookbinder, MD;  Location: [facility];  Service: General;  Laterality: N/A;   MELANOMA EXCISION     POLYPECTOMY  01/05/2022   Procedure: POLYPECTOMY;  Surgeon: Milus Banister, MD;  Location: [facility];  Service: Gastroenterology;;   POSTERIOR CERVICAL LAMINECTOMY Right 10/07/2013   Procedure: POSTERIOR CERVICAL LAMINECTOMY RIGHT CERVICAL SEVEN -THORACIC ONE FORAMINOTOMY;  Surgeon: Faythe Ghee, MD;  Location: [facility];  Service: Neurosurgery;  Laterality: Right;   POSTERIOR LUMBAR FUSION  X 7   SPINAL CORD STIMULATOR IMPLANT  ~ 2012   SPINAL CORD STIMULATOR REMOVAL N/A 09/19/2012   Procedure: LUMBAR SPINAL CORD STIMULATOR REMOVAL;  Surgeon: Faythe Ghee, MD;  Location: [facility];  Service: Neurosurgery;  Laterality: N/A;   TONSILLECTOMY     ULNAR NERVE TRANSPOSITION Right 07/15/2015   supercharged AIN to deep motor branch ulnar nerve nerve transfer   UMBILICAL HERNIA REPAIR N/A 03/28/2016    Procedure: LAPAROSCOPIC UMBILICAL HERNIA;  Surgeon: Rolm Bookbinder, MD;  Location: [facility];  Service: General;  Laterality: N/A;   FAMILY HISTORY Family History  Problem Relation Age of Onset   Cancer Mother    Alzheimer's disease Father    Colon cancer Neg Hx    Colon polyps Neg Hx    Esophageal cancer Neg Hx    Stomach cancer Neg Hx    Rectal cancer Neg Hx    SOCIAL HISTORY Social History   Tobacco Use   Smoking status: Never   Smokeless tobacco: Never  Vaping Use   Vaping Use: Never used  Substance  Use Topics   Alcohol use: Yes    Alcohol/week: 1.0 - 2.0 standard drink of alcohol    Types: 1 - 2 Cans of beer per week   Drug use: No       OPHTHALMIC EXAM:  Base Eye Exam     Visual Acuity (Snellen - Linear)       Right Left   Dist [place] 20/25 20/100   Dist ph [place]  NI         Tonometry (Tonopen, 8:45 AM)       Right Left   Pressure 19 17         Pupils       Dark Light Shape React APD   Right 3 2 Round Slow None   Left 3 2 Round Slow None         Visual Fields       Left Right    Full Full         Extraocular Movement       Right Left    Full, Ortho Full, Ortho         Neuro/Psych     Oriented x3: Yes         Dilation     Both eyes: 1.0% Mydriacyl, 2.5% Phenylephrine @ 8:41 AM           Slit Lamp and Fundus Exam     Slit Lamp Exam       Right Left   Lids/Lashes Dermatochalasis - upper lid Dermatochalasis - upper lid   Conjunctiva/Sclera White and quiet nasal and temporal pinguecula   Cornea mild arcus, tear film debris, well healed cataract wound mild arcus, tear film debris, well healed cataract wound   Anterior Chamber deep and clear deep and clear   Iris Round and dilated Round and moderately dilated   Lens PC IOL in good position PC IOL in good position   Anterior Vitreous mild syneresis mild syneresis         Fundus Exam       Right Left   Disc mild Pallor, Sharp rim Pink and Sharp, focal PPP   C/D Ratio  0.2 0.2   Macula Flat, Good foveal reflex, RPE mottling, No heme or edema Blunted foveal reflex, small macular hole with surrounding edema / cystic changes   Vessels mild attenuation, mild tortuosity mild attenuation, mild tortuosity   Periphery Attached Attached            IMAGING AND PROCEDURES  Imaging and Procedures for 04/21/2022  OCT, Retina - OU - Both Eyes       Right Eye Quality was good. Central Foveal Thickness: 273. Progression has no prior data. Findings include normal foveal contour, no IRF, no SRF.   Left Eye Quality was good. Central Foveal Thickness: 424. Progression has no prior data. Findings include abnormal foveal contour, intraretinal fluid, macular hole, subretinal fluid, outer retinal atrophy (Small FTMH with surrounding cystic changes).   Notes *Images captured and stored on drive  Diagnosis / Impression:  OD: NFP, no IRF/SRF OS: Small FTMH with surrounding cystic changes  Clinical management:  See below  Abbreviations: NFP - Normal foveal profile. CME - cystoid macular edema. PED - pigment epithelial detachment. IRF - intraretinal fluid. SRF - subretinal fluid. EZ - ellipsoid zone. ERM - epiretinal membrane. ORA - outer retinal atrophy. ORT - outer retinal tubulation. SRHM - subretinal hyper-reflective material. IRHM - intraretinal hyper-reflective material  ASSESSMENT/PLAN:    ICD-10-CM   1. Macular hole of left eye  H35.342 OCT, Retina - OU - Both Eyes    2. Essential hypertension  I10     3. Hypertensive retinopathy of both eyes  H35.033     4. Pseudophakia, both eyes  Z96.1      Macular hole, left eye   - small full thickness macular hole -- pt reports ~1 wk history of decreased vision OS - The natural history, anatomy, potential for loss of vision, and treatment options including vitrectomy techniques and the complications of endophthalmitis, retinal detachment, vitreous hemorrhage, cataract progression and permanent vision  loss discussed with the patient. - recommend 25g PPV w/ membrane peel and gas OS under general anesthesia - pt wishes to proceed with surgery - RBA of procedure discussed, questions answered - informed consent obtained and signed - surgery scheduled for Thursday, September 28, [facility] OR 8  2,3. Hypertensive retinopathy OU - discussed importance of tight BP control - monitor  4. Pseudophakia OU  - s/p CE/IOL OU (Dr. Talbert Forest)  - IOLs in good position, doing well  - monitor  Ophthalmic Meds Ordered this visit:  No orders of the defined types were placed in this encounter.    Return in about 3 weeks (around 05/12/2022) for POV s/p mac hole OS.  There are no Patient Instructions on file for this visit.   Explained the diagnoses, plan, and follow up with the patient and they expressed understanding.  Patient expressed understanding of the importance of proper follow up care.   This document serves as a record of services personally performed by Gardiner Sleeper, MD, PhD. It was created on their behalf by San Jetty. Owens Shark, OA an ophthalmic technician. The creation of this record is the provider's dictation and/or activities during the visit.    Electronically signed by: San Jetty. Owens Shark, [place] 08.31.2023 11:48 PM  This document serves as a record of services personally performed by Gardiner Sleeper, MD, PhD. It was created on their behalf by San Jetty. Owens Shark, OA an ophthalmic technician. The creation of this record is the provider's dictation and/or activities during the visit.    Electronically signed by: San Jetty. Owens Shark, [place] 09.08.2023 11:48 PM  Gardiner Sleeper, M.D., Ph.D. Diseases & Surgery of the Retina and Vitreous [facility]  I have reviewed the above documentation for accuracy and completeness, and I agree with the above. Gardiner Sleeper, M.D., Ph.D. 04/21/22 11:51 PM  Abbreviations: M myopia (nearsighted); A astigmatism; H hyperopia (farsighted); P presbyopia; Mrx  spectacle prescription;  CTL contact lenses; OD right eye; OS left eye; OU both eyes  XT exotropia; ET esotropia; PEK punctate epithelial keratitis; PEE punctate epithelial erosions; DES dry eye syndrome; MGD meibomian gland dysfunction; ATs artificial tears; PFAT's preservative free artificial tears; [place] nuclear sclerotic cataract; PSC posterior subcapsular cataract; ERM epi-retinal membrane; PVD posterior vitreous detachment; RD retinal detachment; DM diabetes mellitus; DR diabetic retinopathy; NPDR non-proliferative diabetic retinopathy; PDR proliferative diabetic retinopathy; CSME clinically significant macular edema; DME diabetic macular edema; dbh dot blot hemorrhages; CWS cotton wool spot; POAG primary open angle glaucoma; C/D cup-to-disc ratio; HVF humphrey visual field; GVF goldmann visual field; OCT optical coherence tomography; IOP intraocular pressure; BRVO Branch retinal vein occlusion; CRVO central retinal vein occlusion; CRAO central retinal artery occlusion; BRAO branch retinal artery occlusion; RT retinal tear; SB scleral buckle; PPV pars plana vitrectomy; VH Vitreous hemorrhage; PRP panretinal laser photocoagulation; IVK intravitreal kenalog; VMT  vitreomacular traction; MH Macular hole;  NVD neovascularization of the disc; NVE neovascularization elsewhere; AREDS age related eye disease study; ARMD age related macular degeneration; POAG primary open angle glaucoma; EBMD epithelial/anterior basement membrane dystrophy; ACIOL anterior chamber intraocular lens; IOL intraocular lens; PCIOL posterior chamber intraocular lens; Phaco/IOL phacoemulsification with intraocular lens placement; [place] photorefractive keratectomy; LASIK laser assisted in situ keratomileusis; HTN hypertension; DM diabetes mellitus; COPD chronic obstructive pulmonary disease

## 2022-04-18 ENCOUNTER — Other Ambulatory Visit (HOSPITAL_COMMUNITY): Payer: Self-pay

## 2022-04-19 DIAGNOSIS — L820 Inflamed seborrheic keratosis: Secondary | ICD-10-CM | POA: Diagnosis not present

## 2022-04-19 DIAGNOSIS — L570 Actinic keratosis: Secondary | ICD-10-CM | POA: Diagnosis not present

## 2022-04-19 DIAGNOSIS — L578 Other skin changes due to chronic exposure to nonionizing radiation: Secondary | ICD-10-CM | POA: Diagnosis not present

## 2022-04-19 DIAGNOSIS — D225 Melanocytic nevi of trunk: Secondary | ICD-10-CM | POA: Diagnosis not present

## 2022-04-19 DIAGNOSIS — L814 Other melanin hyperpigmentation: Secondary | ICD-10-CM | POA: Diagnosis not present

## 2022-04-19 DIAGNOSIS — Z85820 Personal history of malignant melanoma of skin: Secondary | ICD-10-CM | POA: Diagnosis not present

## 2022-04-21 ENCOUNTER — Ambulatory Visit (INDEPENDENT_AMBULATORY_CARE_PROVIDER_SITE_OTHER): Payer: Medicare HMO | Admitting: Ophthalmology

## 2022-04-21 ENCOUNTER — Encounter (INDEPENDENT_AMBULATORY_CARE_PROVIDER_SITE_OTHER): Payer: Self-pay | Admitting: Ophthalmology

## 2022-04-21 ENCOUNTER — Other Ambulatory Visit (HOSPITAL_COMMUNITY): Payer: Self-pay | Admitting: *Deleted

## 2022-04-21 DIAGNOSIS — Z961 Presence of intraocular lens: Secondary | ICD-10-CM | POA: Diagnosis not present

## 2022-04-21 DIAGNOSIS — H35033 Hypertensive retinopathy, bilateral: Secondary | ICD-10-CM

## 2022-04-21 DIAGNOSIS — H35342 Macular cyst, hole, or pseudohole, left eye: Secondary | ICD-10-CM

## 2022-04-21 DIAGNOSIS — I10 Essential (primary) hypertension: Secondary | ICD-10-CM

## 2022-04-21 DIAGNOSIS — H3581 Retinal edema: Secondary | ICD-10-CM

## 2022-04-24 ENCOUNTER — Encounter (HOSPITAL_COMMUNITY): Payer: Self-pay

## 2022-04-24 ENCOUNTER — Encounter (HOSPITAL_COMMUNITY)
Admission: RE | Admit: 2022-04-24 | Discharge: 2022-04-24 | Disposition: A | Discharge status: Home / Self Care | Payer: Medicare HMO | Source: Ambulatory Visit | Attending: Orthopedic Surgery | Admitting: Orthopedic Surgery

## 2022-04-24 ENCOUNTER — Other Ambulatory Visit: Payer: Self-pay

## 2022-04-24 DIAGNOSIS — S52501D Unspecified fracture of the lower end of right radius, subsequent encounter for closed fracture with routine healing: Secondary | ICD-10-CM | POA: Diagnosis not present

## 2022-04-24 DIAGNOSIS — Z8673 Personal history of transient ischemic attack (TIA), and cerebral infarction without residual deficits: Secondary | ICD-10-CM | POA: Insufficient documentation

## 2022-04-24 DIAGNOSIS — M65321 Trigger finger, right index finger: Secondary | ICD-10-CM | POA: Diagnosis not present

## 2022-04-24 DIAGNOSIS — Z4789 Encounter for other orthopedic aftercare: Secondary | ICD-10-CM | POA: Diagnosis not present

## 2022-04-24 DIAGNOSIS — M65322 Trigger finger, left index finger: Secondary | ICD-10-CM | POA: Diagnosis not present

## 2022-04-24 DIAGNOSIS — I350 Nonrheumatic aortic (valve) stenosis: Secondary | ICD-10-CM | POA: Insufficient documentation

## 2022-04-24 DIAGNOSIS — M1611 Unilateral primary osteoarthritis, right hip: Secondary | ICD-10-CM | POA: Insufficient documentation

## 2022-04-24 DIAGNOSIS — M6530 Trigger finger, unspecified finger: Secondary | ICD-10-CM | POA: Diagnosis not present

## 2022-04-24 DIAGNOSIS — Z01818 Encounter for other preprocedural examination: Secondary | ICD-10-CM | POA: Insufficient documentation

## 2022-04-24 DIAGNOSIS — M65332 Trigger finger, left middle finger: Secondary | ICD-10-CM | POA: Diagnosis not present

## 2022-04-24 DIAGNOSIS — M65341 Trigger finger, right ring finger: Secondary | ICD-10-CM | POA: Diagnosis not present

## 2022-04-24 DIAGNOSIS — I10 Essential (primary) hypertension: Secondary | ICD-10-CM

## 2022-04-24 DIAGNOSIS — M25531 Pain in right wrist: Secondary | ICD-10-CM | POA: Diagnosis not present

## 2022-04-24 LAB — BASIC METABOLIC PANEL
Anion gap: 6 (ref 5–15)
BUN: 17 mg/dL (ref 8–23)
CO2: 26 mmol/L (ref 22–32)
Calcium: 9.5 mg/dL (ref 8.9–10.3)
Chloride: 108 mmol/L (ref 98–111)
Creatinine, Ser: 0.99 mg/dL (ref 0.61–1.24)
GFR, Estimated: >60 mL/min (ref >60)
Glucose, Bld: 112 mg/dL — ABNORMAL HIGH (ref 70–99)
Potassium: 4.6 mmol/L (ref 3.5–5.1)
Sodium: 140 mmol/L (ref 135–145)

## 2022-04-24 LAB — CBC
HCT: 41.3 % (ref 39.0–52.0)
Hemoglobin: 13.9 g/dL (ref 13.0–17.0)
MCH: 31.2 pg (ref 26.0–34.0)
MCHC: 33.7 g/dL (ref 30.0–36.0)
MCV: 92.8 fL (ref 80.0–100.0)
Platelets: 145 10*3/uL — ABNORMAL LOW (ref 150–400)
RBC: 4.45 MIL/uL (ref 4.22–5.81)
RDW: 12.3 % (ref 11.5–15.5)
WBC: 5.3 10*3/uL (ref 4.0–10.5)
nRBC: 0 % (ref 0.0–0.2)

## 2022-04-24 LAB — SURGICAL PCR SCREEN
MRSA, PCR: NEGATIVE
Staphylococcus aureus: NEGATIVE

## 2022-04-24 NOTE — Progress Notes (Signed)
For Short Stay: [place] appointment date: Date of COVID positive in last 14 days:  Bowel Prep reminder:   For Anesthesia: PCP [facility]: [facility]. Cardiologist - Dr. Larae Grooms.: LOV: 06/06/21 Clearance: Christen Bame: NP: 02/24/22: Epic. Chest x-ray -  EKG - 02/24/22 Stress Test -  ECHO - 06/23/21 Cardiac Cath -  Pacemaker/ICD device last checked: Pacemaker orders received: Device Rep notified:  Spinal Cord Stimulator:  Sleep Study -  CPAP -   Fasting Blood Sugar -  Checks Blood Sugar _____ times a day Date and result of last Hgb A1c-  Blood Thinner Instructions: Aspirin Instructions: Last Dose:  Activity level: Can go up a flight of stairs and activities of daily living without stopping and without chest pain and/or shortness of breath   Able to exercise without chest pain and/or shortness of breath   Unable to go up a flight of stairs without chest pain and/or shortness of breath     Anesthesia review: Hx: Heart murmur,HTN,Stroke.  Patient denies shortness of breath, fever, cough and chest pain at PAT appointment   Patient verbalized understanding of instructions that were given to them at the PAT appointment. Patient was also instructed that they will need to review over the PAT instructions again at home before surgery.

## 2022-04-24 NOTE — Patient Instructions (Addendum)
DUE TO COVID-19 ONLY TWO VISITORS  (aged 70 and older)  ARE ALLOWED TO COME WITH YOU AND STAY IN THE WAITING ROOM ONLY DURING PRE OP AND PROCEDURE.   **NO VISITORS ARE ALLOWED IN THE SHORT STAY AREA OR RECOVERY ROOM!!**  IF YOU WILL BE ADMITTED INTO THE HOSPITAL YOU ARE ALLOWED ONLY FOUR SUPPORT PEOPLE DURING VISITATION HOURS ONLY (7 AM -8PM)   The support person(s) must pass our screening, gel in and out, and wear a mask at all times, including in the patient's room. Patients must also wear a mask when staff or their support person are in the room. Visitors GUEST BADGE MUST BE WORN VISIBLY  One adult visitor may remain with you overnight and MUST be in the room by 8 P.M.     Your procedure is scheduled on: 05/01/22   Report to [facility] Main Entrance    Report to admitting at : 11:15 AM   Call this number if you have problems the morning of surgery [phone]   Do not eat food :After Midnight.   After Midnight you may have the following liquids until: 10:45 AM DAY OF SURGERY  Water Black Coffee (sugar ok, NO MILK/CREAM OR CREAMERS)  Tea (sugar ok, NO MILK/CREAM OR CREAMERS) regular and decaf                             Plain Jell-O (NO RED)                                           Fruit ices (not with fruit pulp, NO RED)                                     Popsicles (NO RED)                                                                  Juice: apple, WHITE grape, WHITE cranberry Sports drinks like Gatorade (NO RED)              Drink Ensure drink AT: 10:45 AM the day of surgery.     The day of surgery:  Drink ONE (1) Pre-Surgery Clear Ensure or G2 at AM the morning of surgery. Drink in one sitting. Do not sip.  This drink was given to you during your hospital  pre-op appointment visit. Nothing else to drink after completing the  Pre-Surgery Clear Ensure or G2.          If you have questions, please contact your surgeon's office.    Oral Hygiene is also  important to reduce your risk of infection.                                    Remember - BRUSH YOUR TEETH THE MORNING OF SURGERY WITH YOUR REGULAR TOOTHPASTE   Do NOT smoke after Midnight   Take these medicines the morning of surgery with A SIP OF  WATER: gabapentin,allopurinol.Use eye drops as usual.  DO NOT TAKE ANY ORAL DIABETIC MEDICATIONS DAY OF YOUR SURGERY  Bring CPAP mask and tubing day of surgery.                              You may not have any metal on your body including hair pins, jewelry, and body piercing             Do not wear  lotions, powders, perfumes/cologne, or deodorant              Men may shave face and neck.   Do not bring valuables to the hospital. [place].   Contacts, dentures or bridgework may not be worn into surgery.   Bring small overnight bag day of surgery.   DO NOT [place]. PHARMACY WILL DISPENSE MEDICATIONS LISTED ON YOUR MEDICATION LIST TO YOU DURING YOUR ADMISSION [place]!    Patients discharged on the day of surgery will not be allowed to drive home.  Someone NEEDS to stay with you for the first 24 hours after anesthesia.   Special Instructions: Bring a copy of your healthcare power of attorney and living will documents         the day of surgery if you haven't scanned them before.              Please read over the following fact sheets you were given: IF YOU HAVE QUESTIONS ABOUT YOUR PRE-OP INSTRUCTIONS PLEASE CALL [phone]     [facility] - Preparing for Surgery Before surgery, you can play an important role.  Because skin is not sterile, your skin needs to be as free of germs as possible.  You can reduce the number of germs on your skin by washing with CHG (chlorahexidine gluconate) soap before surgery.  CHG is an antiseptic cleaner which kills germs and bonds with the skin to continue killing germs even after washing. Please DO NOT use if  you have an allergy to CHG or antibacterial soaps.  If your skin becomes reddened/irritated stop using the CHG and inform your nurse when you arrive at Short Stay. Do not shave (including legs and underarms) for at least 48 hours prior to the first CHG shower.  You may shave your face/neck. Please follow these instructions carefully:  1.  Shower with CHG Soap the night before surgery and the  morning of Surgery.  2.  If you choose to wash your hair, wash your hair first as usual with your  normal  shampoo.  3.  After you shampoo, rinse your hair and body thoroughly to remove the  shampoo.                           4.  Use CHG as you would any other liquid soap.  You can apply chg directly  to the skin and wash                       Gently with a scrungie or clean washcloth.  5.  Apply the CHG Soap to your body ONLY FROM THE NECK DOWN.   Do not use on face/ open  Wound or open sores. Avoid contact with eyes, ears mouth and genitals (private parts).                       Wash face,  Genitals (private parts) with your normal soap.             6.  Wash thoroughly, paying special attention to the area where your surgery  will be performed.  7.  Thoroughly rinse your body with warm water from the neck down.  8.  DO NOT shower/wash with your normal soap after using and rinsing off  the CHG Soap.                9.  Pat yourself dry with a clean towel.            10.  Wear clean pajamas.            11.  Place clean sheets on your bed the night of your first shower and do not  sleep with pets. Day of Surgery : Do not apply any lotions/deodorants the morning of surgery.  Please wear clean clothes to the hospital/surgery center.  FAILURE TO FOLLOW THESE INSTRUCTIONS MAY RESULT IN THE CANCELLATION OF YOUR SURGERY PATIENT SIGNATURE_________________________________  NURSE  SIGNATURE__________________________________  ________________________________________________________________________   Adam Phenix  An incentive spirometer is a tool that can help keep your lungs clear and active. This tool measures how well you are filling your lungs with each breath. Taking long deep breaths may help reverse or decrease the chance of developing breathing (pulmonary) problems (especially infection) following: A long period of time when you are unable to move or be active. BEFORE THE PROCEDURE  If the spirometer includes an indicator to show your best effort, your nurse or respiratory therapist will set it to a desired goal. If possible, sit up straight or lean slightly forward. Try not to slouch. Hold the incentive spirometer in an upright position. INSTRUCTIONS FOR USE  Sit on the edge of your bed if possible, or sit up as far as you can in bed or on a chair. Hold the incentive spirometer in an upright position. Breathe out normally. Place the mouthpiece in your mouth and seal your lips tightly around it. Breathe in slowly and as deeply as possible, raising the piston or the ball toward the top of the column. Hold your breath for 3-5 seconds or for as long as possible. Allow the piston or ball to fall to the bottom of the column. Remove the mouthpiece from your mouth and breathe out normally. Rest for a few seconds and repeat Steps 1 through 7 at least 10 times every 1-2 hours when you are awake. Take your time and take a few normal breaths between deep breaths. The spirometer may include an indicator to show your best effort. Use the indicator as a goal to work toward during each repetition. After each set of 10 deep breaths, practice coughing to be sure your lungs are clear. If you have an incision (the cut made at the time of surgery), support your incision when coughing by placing a pillow or rolled up towels firmly against it. Once you are able to get out of  bed, walk around indoors and cough well. You may stop using the incentive spirometer when instructed by your caregiver.  RISKS AND COMPLICATIONS Take your time so you do not get dizzy or light-headed. If you are in pain, you may need to take or  ask for pain medication before doing incentive spirometry. It is harder to take a deep breath if you are having pain. AFTER USE Rest and breathe slowly and easily. It can be helpful to keep track of a log of your progress. Your caregiver can provide you with a simple table to help with this. If you are using the spirometer at home, follow these instructions: [place] IF:  You are having difficultly using the spirometer. You have trouble using the spirometer as often as instructed. Your pain medication is not giving enough relief while using the spirometer. You develop fever of 100.5 F (38.1 C) or higher. SEEK IMMEDIATE MEDICAL CARE IF:  You cough up bloody sputum that had not been present before. You develop fever of 102 F (38.9 C) or greater. You develop worsening pain at or near the incision site. MAKE SURE YOU:  Understand these instructions. Will watch your condition. Will get help right away if you are not doing well or get worse. Document Released: 12/11/2006 Document Revised: 10/23/2011 Document Reviewed: 02/11/2007 [facility] Patient Information 2014 [place], [place].   ________________________________________________________________________

## 2022-04-25 DIAGNOSIS — K121 Other forms of stomatitis: Secondary | ICD-10-CM | POA: Diagnosis not present

## 2022-04-25 NOTE — Anesthesia Preprocedure Evaluation (Addendum)
Anesthesia Evaluation  Patient identified by MRN, date of birth, ID band Patient awake    Reviewed: Allergy & Precautions, NPO status , Patient's Chart, lab work & pertinent test results  History of Anesthesia Complications (+) DIFFICULT AIRWAY, PROLONGED EMERGENCE and history of anesthetic complications  Airway Mallampati: IV  TM Distance: >3 FB Neck ROM: Full  Mouth opening: Limited Mouth Opening  Dental  (+) Dental Advisory Given   Pulmonary neg pulmonary ROS,    Pulmonary exam normal        Cardiovascular hypertension, Pt. on medications + CAD  Normal cardiovascular exam   '22 TTE - EF 60 to 65%. Mild aortic valve stenosis. Aortic  valve area, by VTI measures 1.32 cm. Aortic valve mean gradient measures 12.0 mmHg. Aortic valve Vmax measures 2.42 m/s.     Neuro/Psych  Hx spinal cord stimulator s/p removal Multiple back surgeries   Neuromuscular disease CVA, No Residual Symptoms negative psych ROS   GI/Hepatic Neg liver ROS, GERD  Controlled,  Endo/Other   Obesity   Renal/GU negative Renal ROS     Musculoskeletal  (+) Arthritis ,  Gout    Abdominal   Peds  Hematology  Plt 145k    Anesthesia Other Findings   Reproductive/Obstetrics                           Anesthesia Physical Anesthesia Plan  ASA: 3  Anesthesia Plan: Spinal   Post-op Pain Management: Tylenol PO (pre-op)*   Induction:   PONV Risk Score and Plan: 1 and Treatment may vary due to age or medical condition and Propofol infusion  Airway Management Planned: Natural Airway and Simple Face Mask  Additional Equipment: None  Intra-op Plan:   Post-operative Plan:   Informed Consent: I have reviewed the patients History and Physical, chart, labs and discussed the procedure including the risks, benefits and alternatives for the proposed anesthesia with the patient or authorized representative who has indicated  his/her understanding and acceptance.       Plan Discussed with: CRNA and Anesthesiologist  Anesthesia Plan Comments: (Labs reviewed, platelets acceptable. Discussed risks and benefits of spinal, including spinal/epidural hematoma, infection, failed block, and PDPH. Patient expressed understanding and wished to proceed. )      Anesthesia Quick Evaluation

## 2022-04-25 NOTE — Progress Notes (Addendum)
Anesthesia Chart Review   Case: [id number] Date/Time: 05/01/22 1335   Procedure: TOTAL HIP ARTHROPLASTY ANTERIOR APPROACH (Right: Hip)   Anesthesia type: Choice   Pre-op diagnosis: right hip osteoarthritis   Location: [facility] ROOM 09 / [facility]   Surgeons: Gaynelle Arabian, MD       DISCUSSION:71 y.o. never smoker with h/o HTN, Stroke, mild aortic valve stenosis (mean gradient 12.0 mmHg, valve area 1.32 cm2), right hip OA scheduled for above procedure 05/01/2022 with Dr. Gaynelle Arabian.   Difficult airway noted in history.  Per previous anesthesia notes this is due to limited oral opening. Per anesthesia note, "Limited space in oropharnyx, use green glide handle and stylet.  Small glottic opening.  Full beard but able to ventilate easily."  Pt seen by cardiology 02/24/2022 for preoperative evaluation.  Per OV note, "The patient is doing well from a cardiac perspective. Therefore, based on ACC/AHA guidelines, the patient would be at acceptable risk for the planned procedure without further cardiovascular testing. He was advised that if he develops new symptoms prior to surgery to contact our office to arrange for a follow-up visit, and he verbalized understanding. According to the Revised Cardiac Risk Index (RCRI), his Perioperative Risk of Major Cardiac Event is (%): 6.6. His Functional Capacity in METs is: 5.62 according to the Duke Activity Status Index (DASI)."  Anticipate pt can proceed with planned procedure barring acute status change.   VS: BP 129/80   Pulse 71   Temp 36.9 C (Oral)   Ht '5\' 6"'$  (1.676 m)   Wt 93.9 kg   SpO2 96%   BMI 33.41 kg/m   PROVIDERS: [facility] - Dr. Larae Grooms LABS: Labs reviewed: Acceptable for surgery. (all labs ordered are listed, but only abnormal results are displayed)  Labs Reviewed  BASIC METABOLIC PANEL - Abnormal; Notable for the following components:      Result Value   Glucose, Bld 112 (*)    All other  components within normal limits  CBC - Abnormal; Notable for the following components:   Platelets 145 (*)    All other components within normal limits  SURGICAL PCR SCREEN  TYPE AND SCREEN     IMAGES:   EKG:   CV: Echo 06/23/2021 1. Left ventricular ejection fraction, by estimation, is 60 to 65%. The  left ventricle has normal function. The left ventricle has no regional  wall motion abnormalities. Left ventricular diastolic parameters were  normal.   2. Right ventricular systolic function is normal. The right ventricular  size is normal.   3. The mitral valve is normal in structure. No evidence of mitral valve  regurgitation. No evidence of mitral stenosis.   4. The aortic valve is calcified. There is moderate calcification of the  aortic valve. There is moderate thickening of the aortic valve. Aortic  valve regurgitation is not visualized. Mild aortic valve stenosis. Aortic  valve area, by VTI measures 1.32  cm. Aortic valve mean gradient measures 12.0 mmHg. Aortic valve Vmax  measures 2.42 m/s.  Past Medical History:  Diagnosis Date   Acute idiopathic gout of right ankle    Aortic valve sclerosis    Arthritis    "all over"   Chronic back pain    "all over"   Complication of anesthesia    slow to wake up at times   DDD (degenerative disc disease), lumbar    Difficult intubation    had trouble with intubation shape of throat;last  sx no problems per pt; 03/28/16 successful oral ETT with green Glidescope, stylet. Has limited oral opening/small glottic opening   Foot swelling    in [place] 07/2013   GERD (gastroesophageal reflux disease)    occ   Gout    Gout of left foot    Heart murmur    states no problems   History of melanoma    Hypercholesterolemia    Hyperlipidemia    Hypertension    Melanoma ([place]) ~ 2010   "above left eye"   Nonrheumatic aortic valve stenosis    Other chronic pain    Primary osteoarthritis involving multiple joints    Rising PSA level     Screening for prostate cancer    Stroke [facility])    "maybe w/my 1st back OR in the 1990's"; denies residual on 07/15/2015    Past Surgical History:  Procedure Laterality Date   APPENDECTOMY     BACK SURGERY     CARPAL TUNNEL RELEASE Right 07/15/2015   w/GUYON'S CANAL RELEASE    CARPAL TUNNEL RELEASE Right 07/15/2015   Procedure: RIGHT FOREARM AND WRIST CARPAL TUNNEL RELEASE;  Surgeon: Roseanne Kaufman, MD;  Location: [place];  Service: Orthopedics;  Laterality: Right;   CARPAL TUNNEL RELEASE Left 03/22/2017   Procedure: OPEN CARPAL TUNNEL RELEASE;  Surgeon: Roseanne Kaufman, MD;  Location: [place];  Service: Orthopedics;  Laterality: Left;  60 mins   COLONOSCOPY     COLONOSCOPY WITH PROPOFOL N/A 01/05/2022   Procedure: COLONOSCOPY WITH PROPOFOL;  Surgeon: Milus Banister, MD;  [facility];  Service: Gastroenterology;  Laterality: N/A;   FOOT SURGERY Right    cut nerve to relieve pain   HEMOSTASIS CLIP PLACEMENT  01/05/2022   Procedure: HEMOSTASIS CLIP PLACEMENT;  Surgeon: Milus Banister, MD;  Location: [facility];  Service: Gastroenterology;;   INGUINAL HERNIA REPAIR Left [place] N/A 03/28/2016   Procedure: INSERTION OF MESH;  Surgeon: Rolm Bookbinder, MD;  Location: [place];  Service: General;  Laterality: N/A;   MELANOMA EXCISION     POLYPECTOMY  01/05/2022   Procedure: POLYPECTOMY;  Surgeon: Milus Banister, MD;  Location: [facility];  Service: Gastroenterology;;   POSTERIOR CERVICAL LAMINECTOMY Right 10/07/2013   Procedure: POSTERIOR CERVICAL LAMINECTOMY RIGHT CERVICAL SEVEN -THORACIC ONE FORAMINOTOMY;  Surgeon: Faythe Ghee, MD;  [facility];  Service: Neurosurgery;  Laterality: Right;   POSTERIOR LUMBAR FUSION  X 7   SPINAL CORD STIMULATOR IMPLANT  ~ 2012   SPINAL CORD STIMULATOR REMOVAL N/A 09/19/2012   Procedure: LUMBAR SPINAL CORD STIMULATOR REMOVAL;  Surgeon: Faythe Ghee, MD;  Location: [facility];  Service: Neurosurgery;  Laterality:  N/A;   TONSILLECTOMY     ULNAR NERVE TRANSPOSITION Right 07/15/2015   supercharged AIN to deep motor branch ulnar nerve nerve transfer   UMBILICAL HERNIA REPAIR N/A 03/28/2016   Procedure: LAPAROSCOPIC UMBILICAL HERNIA;  Surgeon: Rolm Bookbinder, MD;  [facility];  Service: General;  Laterality: N/A;    MEDICATIONS:  allopurinol (ZYLOPRIM) 100 MG tablet   bismuth subsalicylate (PEPTO BISMOL) 262 MG chewable tablet   Difluprednate 0.05 % EMUL   gabapentin (NEURONTIN) 100 MG capsule   HYDROcodone-acetaminophen (NORCO/VICODIN) 5-325 MG tablet   ibuprofen (ADVIL) 200 MG tablet   lisinopril (ZESTRIL) 20 MG tablet   methocarbamol (ROBAXIN) 500 MG tablet   Polyvinyl Alcohol-Povidone (REFRESH OP)   PROLENSA 0.07 % SOLN   RESTASIS 0.05 % ophthalmic emulsion   rosuvastatin (CRESTOR)  20 MG tablet   No current facility-administered medications for this encounter.     Konrad Felix Ward, PA-C [facility] [phone]

## 2022-05-01 ENCOUNTER — Observation Stay (HOSPITAL_COMMUNITY)
Admission: RE | Admit: 2022-05-01 | Discharge: 2022-05-02 | Disposition: A | Discharge status: Home / Self Care | Payer: Medicare HMO | Attending: Orthopedic Surgery | Admitting: Orthopedic Surgery

## 2022-05-01 ENCOUNTER — Ambulatory Visit (HOSPITAL_COMMUNITY): Payer: Medicare HMO

## 2022-05-01 ENCOUNTER — Encounter (HOSPITAL_COMMUNITY)
Admission: RE | Disposition: A | Discharge status: Home / Self Care | Payer: Self-pay | Source: Home / Self Care | Attending: Orthopedic Surgery

## 2022-05-01 ENCOUNTER — Ambulatory Visit (HOSPITAL_BASED_OUTPATIENT_CLINIC_OR_DEPARTMENT_OTHER): Payer: Medicare HMO | Admitting: Anesthesiology

## 2022-05-01 ENCOUNTER — Encounter (HOSPITAL_COMMUNITY): Payer: Self-pay | Admitting: Orthopedic Surgery

## 2022-05-01 ENCOUNTER — Ambulatory Visit (HOSPITAL_COMMUNITY): Payer: Medicare HMO | Admitting: Physician Assistant

## 2022-05-01 ENCOUNTER — Other Ambulatory Visit: Payer: Self-pay

## 2022-05-01 ENCOUNTER — Observation Stay (HOSPITAL_COMMUNITY): Payer: Medicare HMO

## 2022-05-01 DIAGNOSIS — M1611 Unilateral primary osteoarthritis, right hip: Secondary | ICD-10-CM

## 2022-05-01 DIAGNOSIS — Z471 Aftercare following joint replacement surgery: Secondary | ICD-10-CM | POA: Diagnosis not present

## 2022-05-01 DIAGNOSIS — M169 Osteoarthritis of hip, unspecified: Secondary | ICD-10-CM | POA: Diagnosis present

## 2022-05-01 DIAGNOSIS — Z8673 Personal history of transient ischemic attack (TIA), and cerebral infarction without residual deficits: Secondary | ICD-10-CM | POA: Diagnosis not present

## 2022-05-01 DIAGNOSIS — Z85828 Personal history of other malignant neoplasm of skin: Secondary | ICD-10-CM | POA: Diagnosis not present

## 2022-05-01 DIAGNOSIS — I2510 Atherosclerotic heart disease of native coronary artery without angina pectoris: Secondary | ICD-10-CM | POA: Diagnosis not present

## 2022-05-01 DIAGNOSIS — Z96641 Presence of right artificial hip joint: Secondary | ICD-10-CM | POA: Diagnosis not present

## 2022-05-01 DIAGNOSIS — I10 Essential (primary) hypertension: Secondary | ICD-10-CM | POA: Diagnosis not present

## 2022-05-01 DIAGNOSIS — Z79899 Other long term (current) drug therapy: Secondary | ICD-10-CM | POA: Diagnosis not present

## 2022-05-01 DIAGNOSIS — Z01818 Encounter for other preprocedural examination: Secondary | ICD-10-CM

## 2022-05-01 HISTORY — PX: TOTAL HIP ARTHROPLASTY: SHX124

## 2022-05-01 LAB — TYPE AND SCREEN
ABO/RH(D): O NEG
Antibody Screen: NEGATIVE

## 2022-05-01 SURGERY — ARTHROPLASTY, HIP, TOTAL, ANTERIOR APPROACH
Anesthesia: Spinal | Site: Hip | Laterality: Right

## 2022-05-01 MED ORDER — DIFLUPREDNATE 0.05 % OP EMUL: 1.0000 [drp] | Freq: Four times a day (QID) | OPHTHALMIC | Status: DC

## 2022-05-01 MED ORDER — PHENYLEPHRINE 80 MCG/ML (10ML) SYRINGE FOR IV PUSH (FOR BLOOD PRESSURE SUPPORT)
PREFILLED_SYRINGE | INTRAVENOUS | Status: DC | PRN
  Administered 2022-05-01 (×3): 80 ug via INTRAVENOUS

## 2022-05-01 MED ORDER — PHENOL 1.4 % MT LIQD: 1.0000 | OROMUCOSAL | Status: DC | PRN

## 2022-05-01 MED ORDER — METOCLOPRAMIDE HCL 5 MG PO TABS: 5.0000 mg | ORAL_TABLET | Freq: Three times a day (TID) | ORAL | Status: DC | PRN

## 2022-05-01 MED ORDER — MORPHINE SULFATE (PF) 2 MG/ML IV SOLN
0.5000 mg | INTRAVENOUS | Status: DC | PRN
  Administered 2022-05-01: 1 mg via INTRAVENOUS
  Filled 2022-05-01 (×2): qty 1

## 2022-05-01 MED ORDER — LACTATED RINGERS IV SOLN: INTRAVENOUS | Status: DC

## 2022-05-01 MED ORDER — DEXAMETHASONE SODIUM PHOSPHATE 10 MG/ML IJ SOLN
10.0000 mg | Freq: Once | INTRAMUSCULAR | Status: AC
  Administered 2022-05-02: 10 mg via INTRAVENOUS
  Filled 2022-05-01: qty 1

## 2022-05-01 MED ORDER — BISACODYL 10 MG RE SUPP: 10.0000 mg | Freq: Every day | RECTAL | Status: DC | PRN

## 2022-05-01 MED ORDER — ONDANSETRON HCL 4 MG/2ML IJ SOLN: 4.0000 mg | Freq: Four times a day (QID) | INTRAMUSCULAR | Status: DC | PRN

## 2022-05-01 MED ORDER — PROPOFOL 1000 MG/100ML IV EMUL
INTRAVENOUS | Status: AC
  Filled 2022-05-01: qty 100

## 2022-05-01 MED ORDER — OXYCODONE-ACETAMINOPHEN 5-325 MG PO TABS
1.0000 | ORAL_TABLET | ORAL | Status: DC | PRN
  Administered 2022-05-01 – 2022-05-02 (×4): 2 via ORAL
  Filled 2022-05-01 (×4): qty 2

## 2022-05-01 MED ORDER — SODIUM CHLORIDE 0.9 % IV SOLN: INTRAVENOUS | Status: DC

## 2022-05-01 MED ORDER — DEXAMETHASONE SODIUM PHOSPHATE 10 MG/ML IJ SOLN
8.0000 mg | Freq: Once | INTRAMUSCULAR | Status: AC
  Administered 2022-05-01: 8 mg via INTRAVENOUS

## 2022-05-01 MED ORDER — ONDANSETRON HCL 4 MG/2ML IJ SOLN: 4.0000 mg | Freq: Once | INTRAMUSCULAR | Status: DC | PRN

## 2022-05-01 MED ORDER — DOCUSATE SODIUM 100 MG PO CAPS
100.0000 mg | ORAL_CAPSULE | Freq: Two times a day (BID) | ORAL | Status: DC
  Administered 2022-05-01 – 2022-05-02 (×2): 100 mg via ORAL
  Filled 2022-05-01 (×2): qty 1

## 2022-05-01 MED ORDER — FENTANYL CITRATE PF 50 MCG/ML IJ SOSY: 25.0000 ug | PREFILLED_SYRINGE | INTRAMUSCULAR | Status: DC | PRN

## 2022-05-01 MED ORDER — BUPIVACAINE-EPINEPHRINE (PF) 0.25% -1:200000 IJ SOLN
INTRAMUSCULAR | Status: DC | PRN
  Administered 2022-05-01: 30 mL via PERINEURAL

## 2022-05-01 MED ORDER — CYCLOSPORINE 0.05 % OP EMUL
1.0000 [drp] | Freq: Two times a day (BID) | OPHTHALMIC | Status: DC
  Administered 2022-05-02: 1 [drp] via OPHTHALMIC
  Filled 2022-05-01 (×2): qty 30

## 2022-05-01 MED ORDER — KETOROLAC TROMETHAMINE 0.5 % OP SOLN
1.0000 [drp] | Freq: Two times a day (BID) | OPHTHALMIC | Status: DC
  Filled 2022-05-01: qty 5

## 2022-05-01 MED ORDER — POLYETHYLENE GLYCOL 3350 17 G PO PACK: 17.0000 g | PACK | Freq: Every day | ORAL | Status: DC | PRN

## 2022-05-01 MED ORDER — PROPOFOL 10 MG/ML IV BOLUS
INTRAVENOUS | Status: DC | PRN
  Administered 2022-05-01 (×2): 20 mg via INTRAVENOUS

## 2022-05-01 MED ORDER — ALLOPURINOL 100 MG PO TABS
200.0000 mg | ORAL_TABLET | Freq: Every day | ORAL | Status: DC
  Administered 2022-05-02: 200 mg via ORAL
  Filled 2022-05-01: qty 2

## 2022-05-01 MED ORDER — METOCLOPRAMIDE HCL 5 MG/ML IJ SOLN: 5.0000 mg | Freq: Three times a day (TID) | INTRAMUSCULAR | Status: DC | PRN

## 2022-05-01 MED ORDER — PROPOFOL 500 MG/50ML IV EMUL
INTRAVENOUS | Status: DC | PRN
  Administered 2022-05-01: 100 ug/kg/min via INTRAVENOUS

## 2022-05-01 MED ORDER — ONDANSETRON HCL 4 MG/2ML IJ SOLN
INTRAMUSCULAR | Status: DC | PRN
  Administered 2022-05-01: 4 mg via INTRAVENOUS

## 2022-05-01 MED ORDER — POVIDONE-IODINE 10 % EX SWAB
2.0000 | Freq: Once | CUTANEOUS | Status: AC
  Administered 2022-05-01: 2 via TOPICAL

## 2022-05-01 MED ORDER — EPHEDRINE 5 MG/ML INJ
INTRAVENOUS | Status: AC
  Filled 2022-05-01: qty 5

## 2022-05-01 MED ORDER — ACETAMINOPHEN 325 MG PO TABS: 325.0000 mg | ORAL_TABLET | Freq: Four times a day (QID) | ORAL | Status: DC | PRN

## 2022-05-01 MED ORDER — DIPHENHYDRAMINE HCL 12.5 MG/5ML PO ELIX: 12.5000 mg | ORAL_SOLUTION | ORAL | Status: DC | PRN

## 2022-05-01 MED ORDER — ACETAMINOPHEN 500 MG PO TABS
1000.0000 mg | ORAL_TABLET | Freq: Once | ORAL | Status: AC
  Administered 2022-05-01: 1000 mg via ORAL
  Filled 2022-05-01: qty 2

## 2022-05-01 MED ORDER — ASPIRIN 325 MG PO TBEC
325.0000 mg | DELAYED_RELEASE_TABLET | Freq: Two times a day (BID) | ORAL | Status: DC
  Administered 2022-05-02: 325 mg via ORAL
  Filled 2022-05-01: qty 1

## 2022-05-01 MED ORDER — TRAMADOL HCL 50 MG PO TABS: 50.0000 mg | ORAL_TABLET | Freq: Four times a day (QID) | ORAL | Status: DC | PRN

## 2022-05-01 MED ORDER — ORAL CARE MOUTH RINSE: 15.0000 mL | Freq: Once | OROMUCOSAL | Status: AC

## 2022-05-01 MED ORDER — MENTHOL 3 MG MT LOZG: 1.0000 | LOZENGE | OROMUCOSAL | Status: DC | PRN

## 2022-05-01 MED ORDER — ACETAMINOPHEN 10 MG/ML IV SOLN: 1000.0000 mg | Freq: Four times a day (QID) | INTRAVENOUS | Status: DC

## 2022-05-01 MED ORDER — CHLORHEXIDINE GLUCONATE 0.12 % MT SOLN
15.0000 mL | Freq: Once | OROMUCOSAL | Status: AC
  Administered 2022-05-01: 15 mL via OROMUCOSAL

## 2022-05-01 MED ORDER — TRANEXAMIC ACID-NACL 1000-0.7 MG/100ML-% IV SOLN
1000.0000 mg | INTRAVENOUS | Status: AC
  Administered 2022-05-01: 1000 mg via INTRAVENOUS
  Filled 2022-05-01: qty 100

## 2022-05-01 MED ORDER — METHOCARBAMOL 500 MG PO TABS
500.0000 mg | ORAL_TABLET | Freq: Four times a day (QID) | ORAL | Status: DC | PRN
  Administered 2022-05-01 – 2022-05-02 (×3): 500 mg via ORAL
  Filled 2022-05-01 (×3): qty 1

## 2022-05-01 MED ORDER — 0.9 % SODIUM CHLORIDE (POUR BTL) OPTIME
TOPICAL | Status: DC | PRN
  Administered 2022-05-01: 1000 mL

## 2022-05-01 MED ORDER — BUPIVACAINE IN DEXTROSE 0.75-8.25 % IT SOLN
INTRATHECAL | Status: DC | PRN
  Administered 2022-05-01: 1.6 mL via INTRATHECAL

## 2022-05-01 MED ORDER — CEFAZOLIN SODIUM-DEXTROSE 2-4 GM/100ML-% IV SOLN
2.0000 g | INTRAVENOUS | Status: AC
  Administered 2022-05-01: 2 g via INTRAVENOUS
  Filled 2022-05-01: qty 100

## 2022-05-01 MED ORDER — GABAPENTIN 100 MG PO CAPS
100.0000 mg | ORAL_CAPSULE | Freq: Three times a day (TID) | ORAL | Status: DC
  Administered 2022-05-01 – 2022-05-02 (×3): 100 mg via ORAL
  Filled 2022-05-01 (×3): qty 1

## 2022-05-01 MED ORDER — OXYCODONE HCL 5 MG PO TABS: 5.0000 mg | ORAL_TABLET | Freq: Once | ORAL | Status: DC | PRN

## 2022-05-01 MED ORDER — FLEET ENEMA 7-19 GM/118ML RE ENEM: 1.0000 | ENEMA | Freq: Once | RECTAL | Status: DC | PRN

## 2022-05-01 MED ORDER — METHOCARBAMOL 500 MG IVPB - SIMPLE MED: 500.0000 mg | Freq: Four times a day (QID) | INTRAVENOUS | Status: DC | PRN

## 2022-05-01 MED ORDER — EPHEDRINE SULFATE-NACL 50-0.9 MG/10ML-% IV SOSY
PREFILLED_SYRINGE | INTRAVENOUS | Status: DC | PRN
  Administered 2022-05-01 (×4): 5 mg via INTRAVENOUS

## 2022-05-01 MED ORDER — HYDROMORPHONE HCL 1 MG/ML IJ SOLN: 0.5000 mg | INTRAMUSCULAR | Status: DC | PRN

## 2022-05-01 MED ORDER — OXYCODONE HCL 5 MG/5ML PO SOLN: 5.0000 mg | Freq: Once | ORAL | Status: DC | PRN

## 2022-05-01 MED ORDER — PHENYLEPHRINE 80 MCG/ML (10ML) SYRINGE FOR IV PUSH (FOR BLOOD PRESSURE SUPPORT)
PREFILLED_SYRINGE | INTRAVENOUS | Status: AC
  Filled 2022-05-01: qty 10

## 2022-05-01 MED ORDER — CEFAZOLIN SODIUM-DEXTROSE 2-4 GM/100ML-% IV SOLN
2.0000 g | Freq: Four times a day (QID) | INTRAVENOUS | Status: AC
  Administered 2022-05-01 – 2022-05-02 (×2): 2 g via INTRAVENOUS
  Filled 2022-05-01 (×2): qty 100

## 2022-05-01 MED ORDER — HYDROCODONE-ACETAMINOPHEN 5-325 MG PO TABS
1.0000 | ORAL_TABLET | ORAL | Status: DC | PRN
  Administered 2022-05-01 (×2): 1 via ORAL
  Filled 2022-05-01 (×2): qty 1

## 2022-05-01 MED ORDER — ROSUVASTATIN CALCIUM 20 MG PO TABS
20.0000 mg | ORAL_TABLET | Freq: Every day | ORAL | Status: DC
  Administered 2022-05-02: 20 mg via ORAL
  Filled 2022-05-01: qty 1

## 2022-05-01 MED ORDER — BUPIVACAINE-EPINEPHRINE (PF) 0.25% -1:200000 IJ SOLN
INTRAMUSCULAR | Status: AC
  Filled 2022-05-01: qty 30

## 2022-05-01 MED ORDER — ONDANSETRON HCL 4 MG PO TABS: 4.0000 mg | ORAL_TABLET | Freq: Four times a day (QID) | ORAL | Status: DC | PRN

## 2022-05-01 SURGICAL SUPPLY — 43 items
BAG COUNTER SPONGE SURGICOUNT (BAG) IMPLANT
BAG DECANTER FOR FLEXI CONT (MISCELLANEOUS) IMPLANT
BAG ZIPLOCK 12X15 (MISCELLANEOUS) IMPLANT
BALL HIP CERAMIC (Hips) IMPLANT
BLADE SAG 18X100X1.27 (BLADE) ×1 IMPLANT
COVER PERINEAL POST (MISCELLANEOUS) ×1 IMPLANT
COVER SURGICAL LIGHT HANDLE (MISCELLANEOUS) ×1 IMPLANT
CUP ACET PINNACLE SECTR 50MM (Hips) IMPLANT
DRAPE FOOT SWITCH (DRAPES) ×1 IMPLANT
DRAPE STERI IOBAN 125X83 (DRAPES) ×1 IMPLANT
DRAPE U-SHAPE 47X51 STRL (DRAPES) ×2 IMPLANT
DRSG AQUACEL AG ADV 3.5X10 (GAUZE/BANDAGES/DRESSINGS) ×1 IMPLANT
DURAPREP 26ML APPLICATOR (WOUND CARE) ×1 IMPLANT
ELECT REM PT RETURN 15FT ADLT (MISCELLANEOUS) ×1 IMPLANT
GLOVE BIO SURGEON STRL SZ 6.5 (GLOVE) IMPLANT
GLOVE BIO SURGEON STRL SZ7.5 (GLOVE) IMPLANT
GLOVE BIO SURGEON STRL SZ8 (GLOVE) ×1 IMPLANT
GLOVE BIOGEL PI IND STRL 6.5 (GLOVE) IMPLANT
GLOVE BIOGEL PI IND STRL 7.0 (GLOVE) IMPLANT
GLOVE BIOGEL PI IND STRL 8 (GLOVE) ×1 IMPLANT
GOWN STRL REUS W/ TWL LRG LVL3 (GOWN DISPOSABLE) ×1 IMPLANT
GOWN STRL REUS W/ TWL XL LVL3 (GOWN DISPOSABLE) IMPLANT
GOWN STRL REUS W/TWL LRG LVL3 (GOWN DISPOSABLE) ×1
GOWN STRL REUS W/TWL XL LVL3 (GOWN DISPOSABLE)
HIP BALL CERAMIC (Hips) ×1 IMPLANT
HOLDER FOLEY CATH W/STRAP (MISCELLANEOUS) ×1 IMPLANT
KIT TURNOVER KIT A (KITS) IMPLANT
LINER MARATHON 32 50 (Hips) IMPLANT
MANIFOLD NEPTUNE II (INSTRUMENTS) ×1 IMPLANT
PACK ANTERIOR HIP CUSTOM (KITS) ×1 IMPLANT
PENCIL SMOKE EVACUATOR COATED (MISCELLANEOUS) ×1 IMPLANT
PINNACLE SECTOR CUP 50MM (Hips) ×1 IMPLANT
SPIKE FLUID TRANSFER (MISCELLANEOUS) ×1 IMPLANT
STEM FEMORAL SZ5 HIGH ACTIS (Stem) IMPLANT
STRIP CLOSURE SKIN 1/2X4 (GAUZE/BANDAGES/DRESSINGS) ×1 IMPLANT
SUT ETHIBOND NAB CT1 #1 30IN (SUTURE) ×1 IMPLANT
SUT MNCRL AB 4-0 PS2 18 (SUTURE) ×1 IMPLANT
SUT STRATAFIX 0 PDS 27 VIOLET (SUTURE) ×1
SUT VIC AB 2-0 CT1 27 (SUTURE) ×2
SUT VIC AB 2-0 CT1 TAPERPNT 27 (SUTURE) ×2 IMPLANT
SUTURE STRATFX 0 PDS 27 VIOLET (SUTURE) ×1 IMPLANT
TRAY FOLEY MTR SLVR 16FR STAT (SET/KITS/TRAYS/PACK) ×1 IMPLANT
TUBE SUCTION HIGH CAP CLEAR NV (SUCTIONS) ×1 IMPLANT

## 2022-05-01 NOTE — Transfer of Care (Signed)
Immediate Anesthesia Transfer of Care Note  Patient: Anthony Hebert  Procedure(s) Performed: TOTAL HIP ARTHROPLASTY ANTERIOR APPROACH (Right: Hip)  Patient Location: PACU  Anesthesia Type:Spinal  Level of Consciousness: awake, alert  and oriented  Airway & Oxygen Therapy: Patient Spontanous Breathing and Patient connected to face mask oxygen  Post-op Assessment: Report given to RN and Post -op Vital signs reviewed and stable  Post vital signs: Reviewed and stable  Last Vitals:  Vitals Value Taken Time  BP 102/67 05/01/22 1512  Temp    Pulse 64 05/01/22 1514  Resp 16 05/01/22 1514  SpO2 94 % 05/01/22 1514  Vitals shown include unvalidated device data.  Last Pain:  Vitals:   05/01/22 1206  TempSrc: Oral  PainSc: 6          Complications: No notable events documented.

## 2022-05-01 NOTE — Op Note (Signed)
OPERATIVE REPORT- TOTAL HIP ARTHROPLASTY   PREOPERATIVE DIAGNOSIS: Osteoarthritis of the Right hip.   POSTOPERATIVE DIAGNOSIS: Osteoarthritis of the Right  hip.   PROCEDURE: Right total hip arthroplasty, anterior approach.   SURGEON: Gaynelle Arabian, MD   ASSISTANT: Jaynie Bream, PA-C  ANESTHESIA:  Spinal  ESTIMATED BLOOD LOSS:-400 mL    DRAINS: None  COMPLICATIONS: None   CONDITION: PACU - hemodynamically stable.   BRIEF CLINICAL NOTE: Anthony Hebert is a 71 y.o. male who has advanced end-  stage arthritis of their Right  hip with progressively worsening pain and  dysfunction.The patient has failed nonoperative management and presents for  total hip arthroplasty.   PROCEDURE IN DETAIL: After successful administration of spinal  anesthetic, the traction boots for the [facility] bed were placed on both  feet and the patient was placed onto the [facility] bed, boots placed into the leg  holders. The Right hip was then isolated from the perineum with plastic  drapes and prepped and draped in the usual sterile fashion. ASIS and  greater trochanter were marked and a oblique incision was made, starting  at about 1 cm lateral and 2 cm distal to the ASIS and coursing towards  the anterior cortex of the femur. The skin was cut with a 10 blade  through subcutaneous tissue to the level of the fascia overlying the  tensor fascia lata muscle. The fascia was then incised in line with the  incision at the junction of the anterior third and posterior 2/3rd. The  muscle was teased off the fascia and then the interval between the TFL  and the rectus was developed. The Hohmann retractor was then placed at  the top of the femoral neck over the capsule. The vessels overlying the  capsule were cauterized and the fat on top of the capsule was removed.  A Hohmann retractor was then placed anterior underneath the rectus  femoris to give exposure to the entire anterior capsule. A T-shaped   capsulotomy was performed. The edges were tagged and the femoral head  was identified.       Osteophytes are removed off the superior acetabulum.  The femoral neck was then cut in situ with an oscillating saw. Traction  was then applied to the left lower extremity utilizing the [facility]  traction. The femoral head was then removed. Retractors were placed  around the acetabulum and then circumferential removal of the labrum was  performed. Osteophytes were also removed. Reaming starts at 47 mm to  medialize and  Increased in 2 mm increments to 49 mm. We reamed in  approximately 40 degrees of abduction, 20 degrees anteversion. A 50 mm  pinnacle acetabular shell was then impacted in anatomic position under  fluoroscopic guidance with excellent purchase. We did not need to place  any additional dome screws. A 32 mm neutral + 4 marathon liner was then  placed into the acetabular shell.       The femoral lift was then placed along the lateral aspect of the femur  just distal to the vastus ridge. The leg was  externally rotated and capsule  was stripped off the inferior aspect of the femoral neck down to the  level of the lesser trochanter, this was done with electrocautery. The femur was lifted after this was performed. The  leg was then placed in an extended and adducted position essentially delivering the femur. We also removed the capsule superiorly and the piriformis from the piriformis fossa to  gain excellent exposure of the  proximal femur. Rongeur was used to remove some cancellous bone to get  into the lateral portion of the proximal femur for placement of the  initial starter reamer. The starter broaches was placed  the starter broach  and was shown to go down the center of the canal. Broaching  with the Actis system was then performed starting at size 0  coursing  Up to size 5. A size 5 had excellent torsional and rotational  and axial stability. The trial high offset neck was then placed   with a 32 + 5 trial head. The hip was then reduced. We confirmed that  the stem was in the canal both on AP and lateral x-rays. It also has excellent sizing. The hip was reduced with outstanding stability through full extension and full external rotation.. AP pelvis was taken and the leg lengths were measured and found to be equal. Hip was then dislocated again and the femoral head and neck removed. The  femoral broach was removed. Size 5 Actis stem with a high offset  neck was then impacted into the femur following native anteversion. Has  excellent purchase in the canal. Excellent torsional and rotational and  axial stability. It is confirmed to be in the canal on AP and lateral  fluoroscopic views. The 32 + 5 ceramic head was placed and the hip  reduced with outstanding stability. Again AP pelvis was taken and it  confirmed that the leg lengths were equal. The wound was then copiously  irrigated with saline solution and the capsule reattached and repaired  with Ethibond suture. 30 ml of .25% Bupivicaine was  injected into the capsule and into the edge of the tensor fascia lata as well as subcutaneous tissue. The fascia overlying the tensor fascia lata was then closed with a running #1 V-Loc. Subcu was closed with interrupted 2-0 Vicryl and subcuticular running 4-0 Monocryl. Incision was cleaned  and dried. Steri-Strips and a bulky sterile dressing applied. The patient was awakened and transported to  recovery in stable condition.        Please note that a surgical assistant was a medical necessity for this procedure to perform it in a safe and expeditious manner. Assistant was necessary to provide appropriate retraction of vital neurovascular structures and to prevent femoral fracture and allow for anatomic placement of the prosthesis.  Gaynelle Arabian, M.D.

## 2022-05-01 NOTE — Anesthesia Procedure Notes (Signed)
Spinal  Patient location during procedure: OR End time: 05/01/2022 1:33 PM Reason for block: surgical anesthesia Staffing Performed: resident/CRNA  Resident/CRNA: Noralyn Pick D, CRNA Performed by: Suede Greenawalt, Clinical cytogeneticist D, CRNA Authorized by: Audry Pili, MD   Preanesthetic Checklist Completed: patient identified, IV checked, site marked, risks and benefits discussed, surgical consent, monitors and equipment checked, pre-op evaluation and timeout performed Spinal Block Patient position: sitting Prep: DuraPrep Patient monitoring: heart rate, continuous pulse ox and blood pressure Approach: midline Location: L3-4 Injection technique: single-shot Needle Needle type: Pencan  Needle gauge: 24 G Needle length: 9 cm Assessment Sensory level: T6 Events: CSF return

## 2022-05-01 NOTE — Discharge Instructions (Signed)
°Anthony Aluisio, MD °Total Joint Specialist °[facility] °[address] °[place], [place] [address] °[phone] ° °ANTERIOR APPROACH TOTAL HIP REPLACEMENT POSTOPERATIVE DIRECTIONS ° ° ° ° °Hip Rehabilitation, Guidelines Following Surgery  °The results of a hip operation are greatly improved after range of motion and muscle strengthening exercises. Follow all safety measures which are given to protect your hip. If any of these exercises cause increased pain or swelling in your joint, decrease the amount until you are comfortable again. Then slowly increase the exercises. Call your caregiver if you have problems or questions.  ° °BLOOD CLOT PREVENTION °Take a 325 mg Aspirin two times a day for three weeks following surgery. Then take an 81 mg Aspirin once a day for three weeks. Then discontinue Aspirin. °You may resume your vitamins/supplements upon discharge from the hospital. °Do not take any NSAIDs (Advil, Aleve, Ibuprofen, Meloxicam, etc.) until you have discontinued the 325 mg Aspirin. ° °HOME CARE INSTRUCTIONS  °Remove items at home which could result in a fall. This includes throw rugs or furniture in walking pathways.  °ICE to the affected hip as frequently as 20-30 minutes an hour and then as needed for pain and swelling. Continue to use ice on the hip for pain and swelling from surgery. You may notice swelling that will progress down to the foot and ankle. This is normal after surgery. Elevate the leg when you are not up walking on it.   °Continue to use the breathing machine which will help keep your temperature down.  It is common for your temperature to cycle up and down following surgery, especially at night when you are not up moving around and exerting yourself.  The breathing machine keeps your lungs expanded and your temperature down. ° °DIET °You may resume your previous home diet once your are discharged from the hospital. ° °DRESSING / WOUND CARE / SHOWERING °You have  an adhesive waterproof bandage over the incision. Leave this in place until your first follow-up appointment. Once you remove this you will not need to place another bandage.  °You may begin showering 3 days following surgery, but do not submerge the incision under water. ° °ACTIVITY °For the first 3-5 days, it is important to rest and keep the operative leg elevated. You should, as a general rule, rest for 50 minutes and walk/stretch for 10 minutes per hour. After 5 days, you may slowly increase activity as tolerated.  °Perform the exercises you were provided twice a day for about 15-20 minutes each session. Begin these 2 days following surgery. °Walk with your walker as instructed. Use the walker until you are comfortable transitioning to a cane. Walk with the cane in the opposite hand of the operative leg. You may discontinue the cane once you are comfortable and walking steadily. °Avoid periods of inactivity such as sitting longer than an hour when not asleep. This helps prevent blood clots.  °Do not drive a car for 6 weeks or until released by your surgeon.  °Do not drive while taking narcotics. ° °TED HOSE STOCKINGS °Wear the elastic stockings on both legs for three weeks following surgery during the day. You may remove them at night while sleeping. ° °WEIGHT BEARING °Weight bearing as tolerated with assist device (walker, cane, etc) as directed, use it as long as suggested by your surgeon or therapist, typically at least 4-6 weeks. ° °POSTOPERATIVE CONSTIPATION PROTOCOL °Constipation - defined medically as fewer than three stools per week and severe constipation as   less than one stool per week. ° °One of the most common issues patients have following surgery is constipation.  Even if you have a regular bowel pattern at home, your normal regimen is likely to be disrupted due to multiple reasons following surgery.  Combination of anesthesia, postoperative narcotics, change in appetite and fluid intake all can  affect your bowels.  In order to avoid complications following surgery, here are some recommendations in order to help you during your recovery period. ° °Colace (docusate) - Pick up an over-the-counter form of Colace or another stool softener and take twice a day as long as you are requiring postoperative pain medications.  Take with a full glass of water daily.  If you experience loose stools or diarrhea, hold the colace until you stool forms back up.  If your symptoms do not get better within 1 week or if they get worse, check with your doctor. °Dulcolax (bisacodyl) - Pick up over-the-counter and take as directed by the product packaging as needed to assist with the movement of your bowels.  Take with a full glass of water.  Use this product as needed if not relieved by Colace only.  °MiraLax (polyethylene glycol) - Pick up over-the-counter to have on hand.  MiraLax is a solution that will increase the amount of water in your bowels to assist with bowel movements.  Take as directed and can mix with a glass of water, juice, soda, coffee, or tea.  Take if you go more than two days without a movement.Do not use MiraLax more than once per day. Call your doctor if you are still constipated or irregular after using this medication for 7 days in a row. ° °If you continue to have problems with postoperative constipation, please contact the office for further assistance and recommendations.  If you experience "the worst abdominal pain ever" or develop nausea or vomiting, please contact the office immediatly for further recommendations for treatment. ° °ITCHING ° If you experience itching with your medications, try taking only a single pain pill, or even half a pain pill at a time.  You can also use Benadryl over the counter for itching or also to help with sleep.  ° °MEDICATIONS °See your medication summary on the “After Visit Summary” that the nursing staff will review with you prior to discharge.  You may have some home  medications which will be placed on hold until you complete the course of blood thinner medication.  It is important for you to complete the blood thinner medication as prescribed by your surgeon.  Continue your approved medications as instructed at time of discharge. ° °PRECAUTIONS °If you experience chest pain or shortness of breath - call 911 immediately for transfer to the hospital emergency department.  °If you develop a fever greater that 101 F, purulent drainage from wound, increased redness or drainage from wound, foul odor from the wound/dressing, or calf pain - CONTACT YOUR SURGEON.   °                                                °FOLLOW-UP APPOINTMENTS °Make sure you keep all of your appointments after your operation with your surgeon and caregivers. You should call the office at the above phone number and make an appointment for approximately two weeks after the date of your surgery or on the   date instructed by your surgeon outlined in the "After Visit Summary". ° °RANGE OF MOTION AND STRENGTHENING EXERCISES  °These exercises are designed to help you keep full movement of your hip joint. Follow your caregiver's or physical therapist's instructions. Perform all exercises about fifteen times, three times per day or as directed. Exercise both hips, even if you have had only one joint replacement. These exercises can be done on a training (exercise) mat, on the floor, on a table or on a bed. Use whatever works the best and is most comfortable for you. Use music or television while you are exercising so that the exercises are a pleasant break in your day. This will make your life better with the exercises acting as a break in routine you can look forward to.  °Lying on your back, slowly slide your foot toward your buttocks, raising your knee up off the floor. Then slowly slide your foot back down until your leg is straight again.  °Lying on your back spread your legs as far apart as you can without causing  discomfort.  °Lying on your side, raise your upper leg and foot straight up from the floor as far as is comfortable. Slowly lower the leg and repeat.  °Lying on your back, tighten up the muscle in the front of your thigh (quadriceps muscles). You can do this by keeping your leg straight and trying to raise your heel off the floor. This helps strengthen the largest muscle supporting your knee.  °Lying on your back, tighten up the muscles of your buttocks both with the legs straight and with the knee bent at a comfortable angle while keeping your heel on the floor.  ° °POST-OPERATIVE OPIOID TAPER INSTRUCTIONS: °It is important to wean off of your opioid medication as soon as possible. If you do not need pain medication after your surgery it is ok to stop day one. °Opioids include: °Codeine, Hydrocodone(Norco, Vicodin), Oxycodone(Percocet, oxycontin) and hydromorphone amongst others.  °Long term and even short term use of opiods can cause: °Increased pain response °Dependence °Constipation °Depression °Respiratory depression °And more.  °Withdrawal symptoms can include °Flu like symptoms °Nausea, vomiting °And more °Techniques to manage these symptoms °Hydrate well °Eat regular healthy meals °Stay active °Use relaxation techniques(deep breathing, meditating, yoga) °Do Not substitute Alcohol to help with tapering °If you have been on opioids for less than two weeks and do not have pain than it is ok to stop all together.  °Plan to wean off of opioids °This plan should start within one week post op of your joint replacement. °Maintain the same interval or time between taking each dose and first decrease the dose.  °Cut the total daily intake of opioids by one tablet each day °Next start to increase the time between doses. °The last dose that should be eliminated is the evening dose.  ° °IF YOU ARE TRANSFERRED TO A SKILLED REHAB FACILITY °If the patient is transferred to a skilled rehab facility following release from the  hospital, a list of the current medications will be sent to the facility for the patient to continue.  When discharged from the skilled rehab facility, please have the facility set up the patient's Home Health Physical Therapy prior to being released. Also, the skilled facility will be responsible for providing the patient with their medications at time of release from the facility to include their pain medication, the muscle relaxants, and their blood thinner medication. If the patient is still at the rehab facility   at time of the two week follow up appointment, the skilled rehab facility will also need to assist the patient in arranging follow up appointment in our office and any transportation needs. ° °MAKE SURE YOU:  °Understand these instructions.  °Get help right away if you are not doing well or get worse.  ° ° °DENTAL ANTIBIOTICS: ° °In most cases prophylactic antibiotics for Dental procdeures after total joint surgery are not necessary. ° °Exceptions are as follows: ° °1. History of prior total joint infection ° °2. Severely immunocompromised (Organ Transplant, cancer chemotherapy, Rheumatoid biologic °meds such as Humera) ° °3. Poorly controlled diabetes (A1C &gt; 8.0, blood glucose over 200) ° °If you have one of these conditions, contact your surgeon for an antibiotic prescription, prior to your °dental procedure.  ° ° °Pick up stool softner and laxative for home use following surgery while on pain medications. °Do not submerge incision under water. °Please use good hand washing techniques while changing dressing each day. °May shower starting three days after surgery. °Please use a clean towel to pat the incision dry following showers. °Continue to use ice for pain and swelling after surgery. °Do not use any lotions or creams on the incision until instructed by your surgeon. ° °

## 2022-05-01 NOTE — Care Plan (Signed)
Ortho Bundle Case Management Note  Patient Details  Name: Anthony Hebert MRN: [id number] Date of Birth: [birth year]  R THA on 05-01-22 DCP:  Home with family DME:  No needs, has a RW PT:  HEP                   DME Arranged:  N/A DME Agency:  NA  HH Arranged:  NA HH Agency:  NA  Additional Comments: Please contact me with any questions of if this plan should need to change.  Marianne Sofia, RN,CCM [facility]  [phone] 05/01/2022, 1:53 PM

## 2022-05-01 NOTE — Anesthesia Postprocedure Evaluation (Signed)
Anesthesia Post Note  Patient: Anthony Hebert  Procedure(s) Performed: TOTAL HIP ARTHROPLASTY ANTERIOR APPROACH (Right: Hip)     Patient location during evaluation: PACU Anesthesia Type: Spinal Level of consciousness: awake and alert Pain management: pain level controlled Vital Signs Assessment: post-procedure vital signs reviewed and stable Respiratory status: spontaneous breathing and respiratory function stable Cardiovascular status: blood pressure returned to baseline and stable Postop Assessment: spinal receding and no apparent nausea or vomiting Anesthetic complications: no   No notable events documented.  Last Vitals:  Vitals:   05/01/22 1630 05/01/22 1659  BP: 110/67 113/76  Pulse: 67 67  Resp: 15 20  Temp: 36.5 C 37 C  SpO2: 94% 94%    Last Pain:  Vitals:   05/01/22 1630  TempSrc:   PainSc: 0-No pain                 Audry Pili

## 2022-05-01 NOTE — Plan of Care (Signed)
  Problem: Education: Goal: Knowledge of the prescribed therapeutic regimen will improve Outcome: Progressing   Problem: Activity: Goal: Ability to avoid complications of mobility impairment will improve Outcome: Progressing   Problem: Pain Management: Goal: Pain level will decrease with appropriate interventions Outcome: Progressing   Problem: Education: Goal: Knowledge of General Education information will improve Description: Including pain rating scale, medication(s)/side effects and non-pharmacologic comfort measures Outcome: Progressing   Problem: Activity: Goal: Risk for activity intolerance will decrease Outcome: Progressing   Problem: Nutrition: Goal: Adequate nutrition will be maintained Outcome: Progressing   Problem: Elimination: Goal: Will not experience complications related to bowel motility Outcome: Progressing   Problem: Education: Goal: Knowledge of the prescribed therapeutic regimen will improve Outcome: Progressing   Problem: Activity: Goal: Ability to avoid complications of mobility impairment will improve Outcome: Progressing   Problem: Pain Management: Goal: Pain level will decrease with appropriate interventions Outcome: Progressing

## 2022-05-01 NOTE — Interval H&P Note (Signed)
History and Physical Interval Note:  05/01/2022 11:34 AM  Anthony Hebert  has presented today for surgery, with the diagnosis of right hip osteoarthritis.  The various methods of treatment have been discussed with the patient and family. After consideration of risks, benefits and other options for treatment, the patient has consented to  Procedure(s): TOTAL HIP ARTHROPLASTY ANTERIOR APPROACH (Right) as a surgical intervention.  The patient's history has been reviewed, patient examined, no change in status, stable for surgery.  I have reviewed the patient's chart and labs.  Questions were answered to the patient's satisfaction.     Pilar Plate Layton Tappan

## 2022-05-02 ENCOUNTER — Encounter (HOSPITAL_COMMUNITY): Payer: Self-pay | Admitting: Orthopedic Surgery

## 2022-05-02 DIAGNOSIS — I2510 Atherosclerotic heart disease of native coronary artery without angina pectoris: Secondary | ICD-10-CM | POA: Diagnosis not present

## 2022-05-02 DIAGNOSIS — Z8673 Personal history of transient ischemic attack (TIA), and cerebral infarction without residual deficits: Secondary | ICD-10-CM | POA: Diagnosis not present

## 2022-05-02 DIAGNOSIS — Z85828 Personal history of other malignant neoplasm of skin: Secondary | ICD-10-CM | POA: Diagnosis not present

## 2022-05-02 DIAGNOSIS — I10 Essential (primary) hypertension: Secondary | ICD-10-CM | POA: Diagnosis not present

## 2022-05-02 DIAGNOSIS — M1611 Unilateral primary osteoarthritis, right hip: Secondary | ICD-10-CM | POA: Diagnosis not present

## 2022-05-02 DIAGNOSIS — Z79899 Other long term (current) drug therapy: Secondary | ICD-10-CM | POA: Diagnosis not present

## 2022-05-02 LAB — BASIC METABOLIC PANEL
Anion gap: 9 (ref 5–15)
BUN: 19 mg/dL (ref 8–23)
CO2: 27 mmol/L (ref 22–32)
Calcium: 9.1 mg/dL (ref 8.9–10.3)
Chloride: 102 mmol/L (ref 98–111)
Creatinine, Ser: 0.98 mg/dL (ref 0.61–1.24)
GFR, Estimated: >60 mL/min (ref >60)
Glucose, Bld: 186 mg/dL — ABNORMAL HIGH (ref 70–99)
Potassium: 4.8 mmol/L (ref 3.5–5.1)
Sodium: 138 mmol/L (ref 135–145)

## 2022-05-02 LAB — CBC
HCT: 40 % (ref 39.0–52.0)
Hemoglobin: 13.6 g/dL (ref 13.0–17.0)
MCH: 30.8 pg (ref 26.0–34.0)
MCHC: 34 g/dL (ref 30.0–36.0)
MCV: 90.5 fL (ref 80.0–100.0)
Platelets: 180 10*3/uL (ref 150–400)
RBC: 4.42 MIL/uL (ref 4.22–5.81)
RDW: 12.4 % (ref 11.5–15.5)
WBC: 15.2 10*3/uL — ABNORMAL HIGH (ref 4.0–10.5)
nRBC: 0 % (ref 0.0–0.2)

## 2022-05-02 MED ORDER — METHOCARBAMOL 500 MG PO TABS: 500.0000 mg | ORAL_TABLET | Freq: Four times a day (QID) | ORAL | 0 refills | Status: AC | PRN

## 2022-05-02 MED ORDER — ASPIRIN 325 MG PO TBEC: 325.0000 mg | DELAYED_RELEASE_TABLET | Freq: Two times a day (BID) | ORAL | 0 refills | Status: AC

## 2022-05-02 MED ORDER — OXYCODONE HCL 5 MG PO TABS: 5.0000 mg | ORAL_TABLET | Freq: Four times a day (QID) | ORAL | 0 refills | Status: AC | PRN

## 2022-05-02 MED ORDER — TRAMADOL HCL 50 MG PO TABS: 50.0000 mg | ORAL_TABLET | Freq: Four times a day (QID) | ORAL | 0 refills | Status: DC | PRN

## 2022-05-02 NOTE — Evaluation (Addendum)
Physical Therapy Evaluation Patient Details Name: Anthony Hebert MRN: [id number] DOB: 05-Feb-1951 Today's Date: 05/02/2022  History of Present Illness  Pt is a 71 year old male s/p Right THA on 05/01/22. PMHx signficant for but not limited to: chronic back pain, DDD, HTN, heart murmur, gout, CVA, back surgeries, carpal tunnel release  Clinical Impression  Pt is s/p THA resulting in the deficits listed below (see PT Problem List).  Pt will benefit from skilled PT to increase their independence and safety with mobility to allow discharge to the venue listed below.  Pt ambulated in hallway and remained OOB in recliner end of session.   Will return to review LE exercises as plan upon d/c is for HEP.       Recommendations for follow up therapy are one component of a multi-disciplinary discharge planning process, led by the attending physician.  Recommendations may be updated based on patient status, additional functional criteria and insurance authorization.  Follow Up Recommendations Follow physician's recommendations for discharge plan and follow up therapies      Assistance Recommended at Discharge PRN  Patient can return home with the following  Help with stairs or ramp for entrance    Equipment Recommendations None recommended by PT  Recommendations for Other Services       Functional Status Assessment Patient has had a recent decline in their functional status and demonstrates the ability to make significant improvements in function in a reasonable and predictable amount of time.     Precautions / Restrictions Precautions Precautions: Fall Restrictions Weight Bearing Restrictions: No RLE Weight Bearing: Weight bearing as tolerated      Mobility  Bed Mobility Overal bed mobility: Needs Assistance Bed Mobility: Supine to Sit     Supine to sit: Supervision, HOB elevated          Transfers Overall transfer level: Needs assistance Equipment used: Rolling walker (2  wheels) Transfers: Sit to/from Stand Sit to Stand: Min guard           General transfer comment: verbal cues for UE and LE positioning    Ambulation/Gait Ambulation/Gait assistance: Min guard Gait Distance (Feet): 100 Feet Assistive device: Rolling walker (2 wheels) Gait Pattern/deviations: Step-to pattern, Decreased stance time - right, Antalgic, Trunk flexed       General Gait Details: verbal cues for sequence, RW positioning, posture  Stairs            Wheelchair Mobility    Modified Rankin (Stroke Patients Only)       Balance                                             Pertinent Vitals/Pain Pain Assessment Pain Assessment: 0-10 Pain Score: 4  Pain Location: right hip Pain Descriptors / Indicators: Sore, Aching Pain Intervention(s): Monitored during session, Repositioned, Premedicated before session, Ice applied    Home Living Family/patient expects to be discharged to:: Private residence Living Arrangements: Alone Available Help at Discharge: Friend(s);Family Type of Home: House Home Access: Ramped entrance       Home Layout: One level Home Equipment: Conservation officer, nature (2 wheels)      Prior Function Prior Level of Function : Independent/Modified Independent                     Hand Dominance        Extremity/Trunk Assessment  Upper Extremity Assessment Upper Extremity Assessment: RUE deficits/detail RUE Deficits / Details: recent healing fx, per pt, Dr. Amedeo Plenty says he can WBAT with RW with wrist brace    Lower Extremity Assessment Lower Extremity Assessment: RLE deficits/detail RLE Deficits / Details: anticipated post op hip weakness, able to perform ankle pumps       Communication   Communication: No difficulties  Cognition Arousal/Alertness: Awake/alert Behavior During Therapy: WFL for tasks assessed/performed Overall Cognitive Status: Within Functional Limits for tasks assessed                                           General Comments      Exercises     Assessment/Plan    PT Assessment Patient needs continued PT services  PT Problem List Decreased strength;Decreased range of motion;Decreased balance;Decreased mobility;Decreased knowledge of precautions;Decreased knowledge of use of DME;Pain       PT Treatment Interventions Stair training;Gait training;DME instruction;Therapeutic exercise;Balance training;Functional mobility training;Therapeutic activities;Patient/family education    PT Goals (Current goals can be found in the Care Plan section)  Acute Rehab PT Goals PT Goal Formulation: With patient Time For Goal Achievement: 05/09/22 Potential to Achieve Goals: Good    Frequency 7X/week     Co-evaluation               AM-PAC PT "6 Clicks" Mobility  Outcome Measure Help needed turning from your back to your side while in a flat bed without using bedrails?: A Little Help needed moving from lying on your back to sitting on the side of a flat bed without using bedrails?: A Little Help needed moving to and from a bed to a chair (including a wheelchair)?: A Little Help needed standing up from a chair using your arms (e.g., wheelchair or bedside chair)?: A Little Help needed to walk in hospital room?: A Little Help needed climbing 3-5 steps with a railing? : A Little 6 Click Score: 18    End of Session Equipment Utilized During Treatment: Gait belt Activity Tolerance: Patient tolerated treatment well Patient left: in chair;with call bell/phone within reach;with chair alarm set Nurse Communication: Mobility status PT Visit Diagnosis: Other abnormalities of gait and mobility (R26.89)    Time: 0277-4128 PT Time Calculation (min) (ACUTE ONLY): 17 min   Charges:   PT Evaluation $PT Eval Low Complexity: 1 Low        Kati PT, DPT Physical Therapist Acute Rehabilitation Services Preferred contact method: Secure Chat Weekend Pager Only:  [phone] Office: [phone]    Kati L Payson 05/02/2022, 11:29 AM

## 2022-05-02 NOTE — Progress Notes (Signed)
Subjective: 1 Day Post-Op Procedure(s) (LRB): TOTAL HIP ARTHROPLASTY ANTERIOR APPROACH (Right) Patient seen in rounds by Dr. Wynelle Link. Patient is  well.  Denies SOB or chest pain. Denies calf pain. Foley cath removed this AM. Patient reports pain as moderate.  He states it is better controlled after switching to oxycodone pain medication last night. We will start physical therapy today.   Objective: Vital signs in last 24 hours: Temp:  [97.5 F (36.4 C)-98.6 F (37 C)] 97.5 F (36.4 C) (09/19 0442) Pulse Rate:  [64-87] 85 (09/19 0442) Resp:  [12-20] 16 (09/19 0442) BP: (102-145)/(63-91) 140/85 (09/19 0442) SpO2:  [94 %-100 %] 94 % (09/19 0442) Weight:  [93 kg] 93 kg (09/18 1206)  Intake/Output from previous day:  Intake/Output Summary (Last 24 hours) at 05/02/2022 0719 Last data filed at 05/02/2022 0600 Gross per 24 hour  Intake 3040.35 ml  Output 1720 ml  Net 1320.35 ml     Intake/Output this shift: No intake/output data recorded.  Labs: Recent Labs    05/02/22 0323  HGB 13.6   Recent Labs    05/02/22 0323  WBC 15.2*  RBC 4.42  HCT 40.0  PLT 180   Recent Labs    05/02/22 0323  NA 138  K 4.8  CL 102  CO2 27  BUN 19  CREATININE 0.98  GLUCOSE 186*  CALCIUM 9.1   No results for input(s): "LABPT", "INR" in the last 72 hours.  Exam: General - Patient is Alert and Oriented Extremity - Neurologically intact Neurovascular intact Sensation intact distally Dorsiflexion/Plantar flexion intact Dressing - moderate drainage - new Aquacel applied Motor Function - intact, moving foot and toes well on exam.  Past Medical History:  Diagnosis Date   Acute idiopathic gout of right ankle    Aortic valve sclerosis    Arthritis    "all over"   Chronic back pain    "all over"   Complication of anesthesia    slow to wake up at times   DDD (degenerative disc disease), lumbar    Difficult intubation    had trouble with intubation shape of throat;last sx no  problems per pt; 03/28/16 successful oral ETT with green Glidescope, stylet. Has limited oral opening/small glottic opening   Foot swelling    in AA 07/2013   GERD (gastroesophageal reflux disease)    occ   Gout    Gout of left foot    Heart murmur    states no problems   History of melanoma    Hypercholesterolemia    Hyperlipidemia    Hypertension    Melanoma ([place]) ~ 2010   "above left eye"   Nonrheumatic aortic valve stenosis    Other chronic pain    Primary osteoarthritis involving multiple joints    Rising PSA level    Screening for prostate cancer    Stroke [facility])    "maybe w/my 1st back OR in the 1990's"; denies residual on 07/15/2015    Assessment/Plan: 1 Day Post-Op Procedure(s) (LRB): TOTAL HIP ARTHROPLASTY ANTERIOR APPROACH (Right) Principal Problem:   OA (osteoarthritis) of hip Active Problems:   Osteoarthritis of right hip  Estimated body mass index is 33.09 kg/m as calculated from the following:   Height as of this encounter: '5\' 6"'$  (1.676 m).   Weight as of this encounter: 93 kg. Advance diet Up with therapy D/C IV fluids  DVT Prophylaxis - Aspirin Weight bearing as tolerated. Start physical therapy.  Plan is to go Home after hospital  stay. Expected discharge today pending progress with physical therapy and if meeting patient goals. Will do HEP once discharged. Follow-up in clinic in 2 weeks.  The PDMP database was reviewed today prior to any opioid medications being prescribed to this patient.  R. Jaynie Bream, PA-C Orthopedic Surgery [phone] 05/02/2022, 7:19 AM

## 2022-05-02 NOTE — Progress Notes (Signed)
Physical Therapy Treatment Patient Details Name: Anthony Hebert MRN: [id number] DOB: 1951-07-11 Today's Date: 05/02/2022   History of Present Illness Pt is a 71 year old male s/p Right THA on 05/01/22. PMHx signficant for but not limited to: chronic back pain, DDD, HTN, heart murmur, gout, CVA, back surgeries, carpal tunnel release    PT Comments    Pt ambulated in hallway and performed LE exercises.  Pt provided with HEP and reviewed handout.  Pt feels ready for d/c home today and had no further questions.    Recommendations for follow up therapy are one component of a multi-disciplinary discharge planning process, led by the attending physician.  Recommendations may be updated based on patient status, additional functional criteria and insurance authorization.  Follow Up Recommendations  Follow physician's recommendations for discharge plan and follow up therapies     Assistance Recommended at Discharge PRN  Patient can return home with the following Help with stairs or ramp for entrance   Equipment Recommendations  None recommended by PT    Recommendations for Other Services       Precautions / Restrictions Precautions Precautions: Fall Restrictions RLE Weight Bearing: Weight bearing as tolerated     Mobility  Bed Mobility Overal bed mobility: Needs Assistance Bed Mobility: Supine to Sit     Supine to sit: Supervision, HOB elevated     General bed mobility comments: pt in recliner    Transfers Overall transfer level: Needs assistance Equipment used: Rolling walker (2 wheels) Transfers: Sit to/from Stand Sit to Stand: Supervision           General transfer comment: verbal cues for UE and LE positioning    Ambulation/Gait Ambulation/Gait assistance: Supervision Gait Distance (Feet): 200 Feet Assistive device: Rolling walker (2 wheels) Gait Pattern/deviations: Step-to pattern, Decreased stance time - right, Antalgic, Trunk flexed       General Gait  Details: verbal cues for sequence, RW positioning, posture, heel strike   Stairs             Wheelchair Mobility    Modified Rankin (Stroke Patients Only)       Balance                                            Cognition Arousal/Alertness: Awake/alert Behavior During Therapy: WFL for tasks assessed/performed Overall Cognitive Status: Within Functional Limits for tasks assessed                                          Exercises Total Joint Exercises Ankle Circles/Pumps: AROM, Both, 10 reps Quad Sets: AROM, Both, 10 reps Heel Slides: AAROM, Right, 10 reps Hip ABduction/ADduction: AROM, Right, Supine, Standing, 10 reps Long Arc Quad: AROM, Right, 10 reps, Seated Knee Flexion: AROM, Standing, Right, 10 reps Marching in Standing: AROM, Right, 10 reps, Standing Standing Hip Extension: AROM, Right, Standing, 10 reps    General Comments        Pertinent Vitals/Pain Pain Assessment Pain Assessment: 0-10 Pain Score: 4  Pain Location: right hip Pain Descriptors / Indicators: Sore, Aching Pain Intervention(s): Repositioned, Monitored during session    Home Living  Prior Function            PT Goals (current goals can now be found in the care plan section) Progress towards PT goals: Progressing toward goals    Frequency    7X/week      PT Plan Current plan remains appropriate    Co-evaluation              AM-PAC PT "6 Clicks" Mobility   Outcome Measure  Help needed turning from your back to your side while in a flat bed without using bedrails?: A Little Help needed moving from lying on your back to sitting on the side of a flat bed without using bedrails?: A Little Help needed moving to and from a bed to a chair (including a wheelchair)?: A Little Help needed standing up from a chair using your arms (e.g., wheelchair or bedside chair)?: A Little Help needed to walk in  hospital room?: A Little Help needed climbing 3-5 steps with a railing? : A Little 6 Click Score: 18    End of Session Equipment Utilized During Treatment: Gait belt Activity Tolerance: Patient tolerated treatment well Patient left: in chair;with call bell/phone within reach Nurse Communication: Mobility status PT Visit Diagnosis: Other abnormalities of gait and mobility (R26.89)     Time: 3646-8032 PT Time Calculation (min) (ACUTE ONLY): 25 min  Charges:  $Gait Training: 8-22 mins $Therapeutic Exercise: 8-22 mins                    Jannette Spanner PT, DPT Physical Therapist Acute Rehabilitation Services Preferred contact method: Secure Chat Weekend Pager Only: [phone] Office: [phone]     Myrtis Hopping [place] 05/02/2022, 3:33 PM

## 2022-05-02 NOTE — Progress Notes (Signed)
Provided discharge education/instructions, all questions and concerns addressed, dressing to R hip changed per Wells Guiles S.-PA, no further bleeding, noted small unattached edge to upper part of incision, Rebecca-PA made aware. Pt not in acute distress, discharged home with belongings accompanied by family.

## 2022-05-02 NOTE — TOC Transition Note (Signed)
Transition of Care [facility]) - CM/SW Discharge Note   Patient Details  Name: Anthony Hebert MRN: [id number] Date of Birth: 16-Nov-1950  [facility] CM/SW Contact:  Lennart Pall, LCSW Phone Number: 05/02/2022, 9:56 AM   Clinical Narrative:    Met with pt and confirming he has all needed DME at home. Plan for HEP.  No TOC needs.   Final next level of care: Home/Self Care Barriers to Discharge: No Barriers Identified   Patient Goals and CMS Choice Patient states their goals for this hospitalization and ongoing recovery are:: return home      Discharge Placement                       Discharge Plan and Services                DME Arranged: N/A DME Agency: NA       HH Arranged: NA HH Agency: NA        Social Determinants of Health (SDOH) Interventions     Readmission Risk Interventions     No data to display

## 2022-05-02 NOTE — Discharge Summary (Signed)
Physician Discharge Summary   Patient ID: NEO YEPIZ MRN: [id number] DOB/AGE: Mar 15, 1951 71 y.o.  Admit date: 05/01/2022 Discharge date: 05/02/2022  Primary Diagnosis: Osteoarthritis, right hip   Admission Diagnoses:  Past Medical History:  Diagnosis Date   Acute idiopathic gout of right ankle    Aortic valve sclerosis    Arthritis    "all over"   Chronic back pain    "all over"   Complication of anesthesia    slow to wake up at times   DDD (degenerative disc disease), lumbar    Difficult intubation    had trouble with intubation shape of throat;last sx no problems per pt; 03/28/16 successful oral ETT with green Glidescope, stylet. Has limited oral opening/small glottic opening   Foot swelling    in AA 07/2013   GERD (gastroesophageal reflux disease)    occ   Gout    Gout of left foot    Heart murmur    states no problems   History of melanoma    Hypercholesterolemia    Hyperlipidemia    Hypertension    Melanoma ([place]) ~ 2010   "above left eye"   Nonrheumatic aortic valve stenosis    Other chronic pain    Primary osteoarthritis involving multiple joints    Rising PSA level    Screening for prostate cancer    Stroke [facility])    "maybe w/my 1st back OR in the 1990's"; denies residual on 07/15/2015   Discharge Diagnoses:   Principal Problem:   OA (osteoarthritis) of hip Active Problems:   Osteoarthritis of right hip  Estimated body mass index is 33.09 kg/m as calculated from the following:   Height as of this encounter: '5\' 6"'$  (1.676 m).   Weight as of this encounter: 93 kg.  Procedure:  Procedure(s) (LRB): TOTAL HIP ARTHROPLASTY ANTERIOR APPROACH (Right)   Consults: None  HPI: Anthony Hebert is a 71 y.o. male who has advanced end-stage arthritis of their Right  hip with progressively worsening pain and dysfunction.The patient has failed nonoperative management and presents for total hip arthroplasty.   Laboratory Data: Admission on 05/01/2022, Discharged on  05/02/2022  Component Date Value Ref Range Status   WBC 05/02/2022 15.2 (H)  4.0 - 10.5 K/uL Final   RBC 05/02/2022 4.42  4.22 - 5.81 MIL/uL Final   Hemoglobin 05/02/2022 13.6  13.0 - 17.0 g/dL Final   HCT 05/02/2022 40.0  39.0 - 52.0 % Final   MCV 05/02/2022 90.5  80.0 - 100.0 fL Final   MCH 05/02/2022 30.8  26.0 - 34.0 pg Final   MCHC 05/02/2022 34.0  30.0 - 36.0 g/dL Final   RDW 05/02/2022 12.4  11.5 - 15.5 % Final   Platelets 05/02/2022 180  150 - 400 K/uL Final   nRBC 05/02/2022 0.0  0.0 - 0.2 % Final   Performed at [facility], [place] [address], [place], [place] [address]   Sodium 05/02/2022 138  135 - 145 mmol/L Final   Potassium 05/02/2022 4.8  3.5 - 5.1 mmol/L Final   Chloride 05/02/2022 102  98 - 111 mmol/L Final   CO2 05/02/2022 27  22 - 32 mmol/L Final   Glucose, Bld 05/02/2022 186 (H)  70 - 99 mg/dL Final   Glucose reference range applies only to samples taken after fasting for at least 8 hours.   BUN 05/02/2022 19  8 - 23 mg/dL Final   Creatinine, Ser 05/02/2022 0.98  0.61 - 1.24 mg/dL Final  Calcium 05/02/2022 9.1  8.9 - 10.3 mg/dL Final   GFR, Estimated 05/02/2022 >60  >60 mL/min Final   Comment: (NOTE) Calculated using the CKD-EPI Creatinine Equation (2021)    Anion gap 05/02/2022 9  5 - 15 Final   Performed at [facility], [place] [address], [place], [place] [address]  Hospital Outpatient Visit on 04/24/2022  Component Date Value Ref Range Status   MRSA, PCR 04/24/2022 NEGATIVE  NEGATIVE Final   Staphylococcus aureus 04/24/2022 NEGATIVE  NEGATIVE Final   Comment: (NOTE) The Xpert SA Assay (FDA approved for NASAL specimens in patients 61 years of age and older), is one component of a comprehensive surveillance program. It is not intended to diagnose infection nor to guide or monitor treatment. Performed at [facility], [place] [address], [place], [place] [address]    Sodium 04/24/2022 140  135 - 145 mmol/L  Final   Potassium 04/24/2022 4.6  3.5 - 5.1 mmol/L Final   Chloride 04/24/2022 108  98 - 111 mmol/L Final   CO2 04/24/2022 26  22 - 32 mmol/L Final   Glucose, Bld 04/24/2022 112 (H)  70 - 99 mg/dL Final   Glucose reference range applies only to samples taken after fasting for at least 8 hours.   BUN 04/24/2022 17  8 - 23 mg/dL Final   Creatinine, Ser 04/24/2022 0.99  0.61 - 1.24 mg/dL Final   Calcium 04/24/2022 9.5  8.9 - 10.3 mg/dL Final   GFR, Estimated 04/24/2022 >60  >60 mL/min Final   Comment: (NOTE) Calculated using the CKD-EPI Creatinine Equation (2021)    Anion gap 04/24/2022 6  5 - 15 Final   Performed at [facility], [place] [address] [place], [place] [address]   WBC 04/24/2022 5.3  4.0 - 10.5 K/uL Final   RBC 04/24/2022 4.45  4.22 - 5.81 MIL/uL Final   Hemoglobin 04/24/2022 13.9  13.0 - 17.0 g/dL Final   HCT 04/24/2022 41.3  39.0 - 52.0 % Final   MCV 04/24/2022 92.8  80.0 - 100.0 fL Final   MCH 04/24/2022 31.2  26.0 - 34.0 pg Final   MCHC 04/24/2022 33.7  30.0 - 36.0 g/dL Final   RDW 04/24/2022 12.3  11.5 - 15.5 % Final   Platelets 04/24/2022 145 (L)  150 - 400 K/uL Final   nRBC 04/24/2022 0.0  0.0 - 0.2 % Final   Performed at [facility], [place] [address] [place], [place] [address]   ABO/RH(D) 04/24/2022 O NEG   Final   Antibody Screen 04/24/2022 NEG   Final   Sample Expiration 04/24/2022 05/04/2022,2359   Final   Extend sample reason 04/24/2022    Final                   Value:NO TRANSFUSIONS OR PREGNANCY IN THE PAST 3 MONTHS Performed at [place] [address], [place], [place] [address]      X-Rays:DG Pelvis Portable  Result Date: 05/01/2022 CLINICAL DATA:  Status post right hip arthroplasty EXAM: PORTABLE PELVIS 1-2 VIEWS COMPARISON:  Study done earlier today FINDINGS: There is recent right hip arthroplasty. No fracture is seen. There are pockets of air in the soft tissues from recent surgery.  Phleboliths are seen in pelvis. There is surgical fusion in lumbar spine. IMPRESSION: Status post right hip arthroplasty. Electronically Signed   By: Elmer Picker M.D.   On: 05/01/2022 15:46   DG HIP PORT UNILAT WITH PELVIS 1V  RIGHT  Result Date: 05/01/2022 CLINICAL DATA:  Right hip replacement. EXAM: DG HIP (WITH OR WITHOUT PELVIS) 1V PORT RIGHT COMPARISON:  Preoperative radiograph 09/14/2021 FINDINGS: Two fluoroscopic spot views of the pelvis and right hip obtained in the operating room. Right hip arthroplasty in place. Fluoroscopy time 5 seconds. Dose 1.7086 mGy. IMPRESSION: Intraoperative fluoroscopy during right hip arthroplasty. Electronically Signed   By: Keith Rake M.D.   On: 05/01/2022 15:26   DG C-Arm 1-60 Min-No Report  Result Date: 05/01/2022 Fluoroscopy was utilized by the requesting physician.  No radiographic interpretation.   DG C-Arm 1-60 Min-No Report  Result Date: 05/01/2022 Fluoroscopy was utilized by the requesting physician.  No radiographic interpretation.   OCT, Retina - OU - Both Eyes  Result Date: 04/21/2022 Right Eye Quality was good. Central Foveal Thickness: 273. Progression has no prior data. Findings include normal foveal contour, no IRF, no SRF. Left Eye Quality was good. Central Foveal Thickness: 424. Progression has no prior data. Findings include abnormal foveal contour, intraretinal fluid, macular hole, subretinal fluid, outer retinal atrophy (Small FTMH with surrounding cystic changes). Notes *Images captured and stored on drive Diagnosis / Impression: OD: NFP, no IRF/SRF OS: Small FTMH with surrounding cystic changes Clinical management: See below Abbreviations: NFP - Normal foveal profile. CME - cystoid macular edema. PED - pigment epithelial detachment. IRF - intraretinal fluid. SRF - subretinal fluid. EZ - ellipsoid zone. ERM - epiretinal membrane. ORA - outer retinal atrophy. ORT - outer retinal tubulation. SRHM - subretinal hyper-reflective  material. IRHM - intraretinal hyper-reflective material    EKG: Orders placed or performed in visit on 02/24/22   EKG 12-Lead     Hospital Course: Anthony Hebert is a 71 y.o. who was admitted to [facility]. They were brought to the operating room on 05/01/2022 and underwent Procedure(s): [place].  Patient tolerated the procedure well and was later transferred to the recovery room and then to the orthopaedic floor for postoperative care. They were given PO and IV analgesics for pain control following their surgery. They were given 24 hours of postoperative antibiotics of  Anti-infectives (From admission, onward)    Start     Dose/Rate Route Frequency Ordered Stop   05/01/22 2000  ceFAZolin (ANCEF) IVPB 2g/100 mL premix        2 g 200 mL/hr over 30 Minutes Intravenous Every 6 hours 05/01/22 1702 05/02/22 0203   05/01/22 1200  ceFAZolin (ANCEF) IVPB 2g/100 mL premix        2 g 200 mL/hr over 30 Minutes Intravenous On call to O.R. 05/01/22 1147 05/01/22 1335     and started on DVT prophylaxis in the form of Aspirin.   PT and OT were ordered for total joint protocol. Discharge planning consulted to help with postop disposition and equipment needs.  Patient had a fair night on the evening of surgery. Patient was seen during rounds and was ready to go home pending progress with therapy. He worked with therapy on POD #1 and was meeting his goals. Patient was discharged to home later that day in stable condition.  Diet: Regular diet Activity: WBAT Follow-up: in 2 weeks Disposition: Home Discharged Condition: stable   Discharge Instructions     Call MD / Call 911   Complete by: As directed    If you experience chest pain or shortness of breath, CALL 911 and be transported to the hospital emergency room.  If you develope a fever above 101 F,  pus (white drainage) or increased drainage or redness at the wound, or calf pain, call your surgeon's office.    Change dressing   Complete by: As directed    You have an adhesive waterproof bandage over the incision. Leave this in place until your first follow-up appointment. Once you remove this you will not need to place another bandage.   Constipation Prevention   Complete by: As directed    Drink plenty of fluids.  Prune juice may be helpful.  You may use a stool softener, such as Colace (over the counter) 100 mg twice a day.  Use MiraLax (over the counter) for constipation as needed.   Diet - low sodium heart healthy   Complete by: As directed    Do not sit on low chairs, stoools or toilet seats, as it may be difficult to get up from low surfaces   Complete by: As directed    Driving restrictions   Complete by: As directed    No driving for two weeks   Post-operative opioid taper instructions:   Complete by: As directed    POST-OPERATIVE OPIOID TAPER INSTRUCTIONS: It is important to wean off of your opioid medication as soon as possible. If you do not need pain medication after your surgery it is ok to stop day one. Opioids include: Codeine, Hydrocodone(Norco, Vicodin), Oxycodone(Percocet, oxycontin) and hydromorphone amongst others.  Long term and even short term use of opiods can cause: Increased pain response Dependence Constipation Depression Respiratory depression And more.  Withdrawal symptoms can include Flu like symptoms Nausea, vomiting And more Techniques to manage these symptoms Hydrate well Eat regular healthy meals Stay active Use relaxation techniques(deep breathing, meditating, yoga) Do Not substitute Alcohol to help with tapering If you have been on opioids for less than two weeks and do not have pain than it is ok to stop all together.  Plan to wean off of opioids This plan should start within one week post op of your joint replacement. Maintain the same interval or time between taking each dose and first decrease the dose.  Cut the total daily intake of opioids by  one tablet each day Next start to increase the time between doses. The last dose that should be eliminated is the evening dose.      TED hose   Complete by: As directed    Use stockings (TED hose) for three weeks on both leg(s).  You may remove them at night for sleeping.   Weight bearing as tolerated   Complete by: As directed       Allergies as of 05/02/2022   No Known Allergies      Medication List     STOP taking these medications    HYDROcodone-acetaminophen 5-325 MG tablet Commonly known as: NORCO/VICODIN   ibuprofen 200 MG tablet Commonly known as: ADVIL       TAKE these medications    allopurinol 100 MG tablet Commonly known as: ZYLOPRIM Take 200 mg by mouth daily.   aspirin EC 325 MG tablet Take 1 tablet (325 mg total) by mouth 2 (two) times daily for 20 days. Then take one 81 mg aspirin once a day for three weeks. Then discontinue aspirin.   bismuth subsalicylate 161 MG chewable tablet Commonly known as: PEPTO BISMOL Chew 262 mg by mouth as needed for indigestion. Notes to patient: Resume home regimen   Difluprednate 0.05 % Emul Place 1 drop into the left eye 4 (four) times daily. Notes to patient: Resume  home regimen   gabapentin 100 MG capsule Commonly known as: NEURONTIN Take 100 mg by mouth 3 (three) times daily.   lisinopril 20 MG tablet Commonly known as: ZESTRIL Take 20 mg by mouth daily. Notes to patient: Resume home regimen   methocarbamol 500 MG tablet Commonly known as: ROBAXIN Take 1 tablet (500 mg total) by mouth every 6 (six) hours as needed for muscle spasms. Notes to patient: Last dose given 09/19 01:15pm   oxyCODONE 5 MG immediate release tablet Commonly known as: Roxicodone Take 1-2 tablets (5-10 mg total) by mouth every 6 (six) hours as needed for severe pain. Notes to patient: Last dose given 09/19 01:15pm   Prolensa 0.07 % Soln Generic drug: Bromfenac Sodium Place 1 drop into the left eye 2 (two) times daily. Notes  to patient: Resume home regimen   REFRESH OP Place 1 drop into both eyes daily as needed (dry eyes). Notes to patient: Resume home regimen   Restasis 0.05 % ophthalmic emulsion Generic drug: cycloSPORINE Place 1 drop into both eyes 2 (two) times daily.   rosuvastatin 20 MG tablet Commonly known as: CRESTOR Take 1 tablet (20 mg total) by mouth daily.   traMADol 50 MG tablet Commonly known as: ULTRAM Take 1-2 tablets (50-100 mg total) by mouth every 6 (six) hours as needed for moderate pain.               Discharge Care Instructions  (From admission, onward)           Start     Ordered   05/02/22 0000  Weight bearing as tolerated        05/02/22 0725   05/02/22 0000  Change dressing       Comments: You have an adhesive waterproof bandage over the incision. Leave this in place until your first follow-up appointment. Once you remove this you will not need to place another bandage.   05/02/22 0725            Follow-up Information     Gaynelle Arabian, MD. Go on 05/16/2022.   Specialty: Orthopedic Surgery Why: You are scheduled for a follow up appointment on 05-16-22 at 2:15 pm. Contact information: [address] [place] [address] [phone]                 Signed: R. Jaynie Bream, PA-C Orthopedic Surgery 05/02/2022, 8:41 PM

## 2022-05-02 NOTE — Plan of Care (Signed)
  Problem: Education: Goal: Knowledge of the prescribed therapeutic regimen will improve Outcome: Progressing   Problem: Activity: Goal: Ability to avoid complications of mobility impairment will improve Outcome: Progressing   Problem: Clinical Measurements: Goal: Postoperative complications will be avoided or minimized Outcome: Progressing   Problem: Pain Management: Goal: Pain level will decrease with appropriate interventions Outcome: Progressing   Problem: Activity: Goal: Ability to avoid complications of mobility impairment will improve Outcome: Progressing

## 2022-05-05 NOTE — Progress Notes (Signed)
[facility] Note  05/12/2022     CHIEF COMPLAINT Patient presents for Retina Follow Up   HISTORY OF PRESENT ILLNESS: Anthony Hebert is a 71 y.o. male who presents to the clinic today for:   HPI     Retina Follow Up   In left eye.  Severity is moderate.  Duration of 3 weeks.  Since onset it is stable.  I, the attending physician,  performed the HPI with the patient and updated documentation appropriately.        Comments   Pt here for 3 wk ret f/u mac hole OS. Pt states up close [place] is worsening. DVA seems stable.       Last edited by Bernarda Caffey, MD on 05/12/2022 12:14 PM.    Pt had hip sx, he states he has a little pain and is using a walker and cane to walk   Referring physician: [place], [place],   [id number]  HISTORICAL INFORMATION:   Selected notes from the [facility] Referred by Dr. Lucita Ferrara for mac hole OS LEE:  Ocular Hx- PMH-    CURRENT MEDICATIONS: Current Outpatient Medications (Ophthalmic Drugs)  Medication Sig   Polyvinyl Alcohol-Povidone (REFRESH OP) Place 1 drop into both eyes daily as needed (dry eyes).   RESTASIS 0.05 % ophthalmic emulsion Place 1 drop into both eyes 2 (two) times daily.   Difluprednate 0.05 % EMUL Place 1 drop into the left eye 4 (four) times daily.   PROLENSA 0.07 % SOLN Place 1 drop into the left eye 2 (two) times daily.   No current facility-administered medications for this visit. (Ophthalmic Drugs)   Current Outpatient Medications (Other)  Medication Sig   allopurinol (ZYLOPRIM) 100 MG tablet Take 200 mg by mouth daily.   aspirin EC 325 MG tablet Take 1 tablet (325 mg total) by mouth 2 (two) times daily for 20 days. Then take one 81 mg aspirin once a day for three weeks. Then discontinue aspirin.   bismuth subsalicylate (PEPTO BISMOL) 262 MG chewable tablet Chew 262 mg by mouth as needed for indigestion.   gabapentin (NEURONTIN) 100  MG capsule Take 100 mg by mouth 3 (three) times daily.   lisinopril (ZESTRIL) 20 MG tablet Take 20 mg by mouth daily.   methocarbamol (ROBAXIN) 500 MG tablet Take 1 tablet (500 mg total) by mouth every 6 (six) hours as needed for muscle spasms.   oxyCODONE (ROXICODONE) 5 MG immediate release tablet Take 1-2 tablets (5-10 mg total) by mouth every 6 (six) hours as needed for severe pain.   rosuvastatin (CRESTOR) 20 MG tablet Take 1 tablet (20 mg total) by mouth daily.   traMADol (ULTRAM) 50 MG tablet Take 1-2 tablets (50-100 mg total) by mouth every 6 (six) hours as needed for moderate pain.   No current facility-administered medications for this visit. (Other)   REVIEW OF SYSTEMS: ROS   Positive for: Musculoskeletal, Cardiovascular, Eyes Negative for: Constitutional, Gastrointestinal, Neurological, Skin, Genitourinary, HENT, Endocrine, Respiratory, Psychiatric, Allergic/Imm, Heme/Lymph Last edited by Kingsley Spittle, COT on 05/12/2022  8:23 AM.     ALLERGIES No Known Allergies  PAST MEDICAL HISTORY Past Medical History:  Diagnosis Date   Acute idiopathic gout of right ankle    Aortic valve sclerosis    Arthritis    "all over"   Chronic back pain    "all over"   Complication of anesthesia  slow to wake up at times   DDD (degenerative disc disease), lumbar    Difficult intubation    had trouble with intubation shape of throat;last sx no problems per pt; 03/28/16 successful oral ETT with green Glidescope, stylet. Has limited oral opening/small glottic opening   Foot swelling    in [place] 07/2013   GERD (gastroesophageal reflux disease)    occ   Gout    Gout of left foot    Heart murmur    states no problems   History of melanoma    Hypercholesterolemia    Hyperlipidemia    Hypertension    Melanoma ([place]) ~ 2010   "above left eye"   Nonrheumatic aortic valve stenosis    Other chronic pain    Primary osteoarthritis involving multiple joints    Rising PSA level    Screening  for prostate cancer    Stroke [facility])    "maybe w/my 1st back OR in the 1990's"; denies residual on 07/15/2015   Past Surgical History:  Procedure Laterality Date   APPENDECTOMY     BACK SURGERY     CARPAL TUNNEL RELEASE Right 07/15/2015   w/GUYON'S CANAL RELEASE    CARPAL TUNNEL RELEASE Right 07/15/2015   Procedure: RIGHT FOREARM AND WRIST CARPAL TUNNEL RELEASE;  Surgeon: Roseanne Kaufman, MD;  Location: [place];  Service: Orthopedics;  Laterality: Right;   CARPAL TUNNEL RELEASE Left 03/22/2017   Procedure: OPEN CARPAL TUNNEL RELEASE;  Surgeon: Roseanne Kaufman, MD;  Location: [place];  Service: Orthopedics;  Laterality: Left;  60 mins   COLONOSCOPY     COLONOSCOPY WITH PROPOFOL N/A 01/05/2022   Procedure: COLONOSCOPY WITH PROPOFOL;  Surgeon: Milus Banister, MD;  Location: [facility];  Service: Gastroenterology;  Laterality: N/A;   FOOT SURGERY Right    cut nerve to relieve pain   HEMOSTASIS CLIP PLACEMENT  01/05/2022   Procedure: HEMOSTASIS CLIP PLACEMENT;  Surgeon: Milus Banister, MD;  Location: [facility];  Service: Gastroenterology;;   INGUINAL HERNIA REPAIR Left [place] N/A 03/28/2016   Procedure: INSERTION OF MESH;  Surgeon: Rolm Bookbinder, MD;  Location: [place];  Service: General;  Laterality: N/A;   MELANOMA EXCISION     POLYPECTOMY  01/05/2022   Procedure: POLYPECTOMY;  Surgeon: Milus Banister, MD;  Location: [facility];  Service: Gastroenterology;;   POSTERIOR CERVICAL LAMINECTOMY Right 10/07/2013   Procedure: POSTERIOR CERVICAL LAMINECTOMY RIGHT CERVICAL SEVEN -THORACIC ONE FORAMINOTOMY;  Surgeon: Faythe Ghee, MD;  Location: [facility];  Service: Neurosurgery;  Laterality: Right;   POSTERIOR LUMBAR FUSION  X 7   SPINAL CORD STIMULATOR IMPLANT  ~ 2012   SPINAL CORD STIMULATOR REMOVAL N/A 09/19/2012   Procedure: LUMBAR SPINAL CORD STIMULATOR REMOVAL;  Surgeon: Faythe Ghee, MD;  Location: [facility];  Service: Neurosurgery;  Laterality: N/A;    TONSILLECTOMY     TOTAL HIP ARTHROPLASTY Right 05/01/2022   Procedure: TOTAL HIP ARTHROPLASTY ANTERIOR APPROACH;  Surgeon: Gaynelle Arabian, MD;  Location: [facility];  Service: Orthopedics;  Laterality: Right;   ULNAR NERVE TRANSPOSITION Right 07/15/2015   supercharged AIN to deep motor branch ulnar nerve nerve transfer   UMBILICAL HERNIA REPAIR N/A 03/28/2016   Procedure: LAPAROSCOPIC UMBILICAL HERNIA;  Surgeon: Rolm Bookbinder, MD;  Location: [facility] OR;  Service: General;  Laterality: N/A;   FAMILY HISTORY Family History  Problem Relation Age of Onset   Cancer Mother    Alzheimer's disease Father    Colon cancer  Neg Hx    Colon polyps Neg Hx    Esophageal cancer Neg Hx    Stomach cancer Neg Hx    Rectal cancer Neg Hx    SOCIAL HISTORY Social History   Tobacco Use   Smoking status: Never   Smokeless tobacco: Never  Vaping Use   Vaping Use: Never used  Substance Use Topics   Alcohol use: Yes    Alcohol/week: 1.0 - 2.0 standard drink of alcohol    Types: 1 - 2 Cans of beer per week    Comment: socially   Drug use: No       OPHTHALMIC EXAM:  Base Eye Exam     Visual Acuity (Snellen - Linear)       Right Left   Dist [place] 20/20 -2 20/80   Dist ph [place]  20/70 +2         Tonometry (Tonopen, 8:27 AM)       Right Left   Pressure 17 18         Pupils       Dark Light Shape React APD   Right 3 2 Round Brisk None   Left 3 2 Round Brisk None         Visual Fields (Counting fingers)       Left Right    Full Full         Extraocular Movement       Right Left    Full, Ortho Full, Ortho         Neuro/Psych     Oriented x3: Yes   Mood/Affect: Normal         Dilation     Both eyes: 1.0% Mydriacyl, 2.5% Phenylephrine @ 8:28 AM           Slit Lamp and Fundus Exam     Slit Lamp Exam       Right Left   Lids/Lashes Dermatochalasis - upper lid Dermatochalasis - upper lid   Conjunctiva/Sclera White and quiet nasal and temporal pinguecula   Cornea  mild arcus, tear film debris, well healed cataract wound arcus, trace PEE, well healed cataract wound   Anterior Chamber deep and clear deep and clear   Iris Round and dilated Round and moderately dilated to 6.25   Lens PC IOL in good position PC IOL in good position   Anterior Vitreous mild syneresis mild syneresis         Fundus Exam       Right Left   Disc mild Pallor, Sharp rim Pink and Sharp, focal PPP IT rim   C/D Ratio 0.2 0.3   Macula Flat, Good foveal reflex, RPE mottling, No heme or edema Blunted foveal reflex, macular hole -- stable with surrounding edema / cystic changes improved   Vessels mild attenuation, mild tortuosity mild attenuation, mild tortuosity   Periphery Attached Attached           IMAGING AND PROCEDURES  Imaging and Procedures for 05/12/2022  OCT, Retina - OU - Both Eyes       Right Eye Quality was good. Central Foveal Thickness: 271. Progression has been stable. Findings include normal foveal contour, no IRF, no SRF.   Left Eye Quality was good. Central Foveal Thickness: 383. Progression has improved. Findings include abnormal foveal contour, intraretinal fluid, macular hole, subretinal fluid, outer retinal atrophy (thin FTMH -- stable, mild interval improvement in surrounding cystic changes).   Notes *Images captured and stored on drive  Diagnosis /  Impression:  OD: NFP, no IRF/SRF OS: thin FTMH -- stable, mild interval improvement in surrounding cystic changes  Clinical management:  See below  Abbreviations: NFP - Normal foveal profile. CME - cystoid macular edema. PED - pigment epithelial detachment. IRF - intraretinal fluid. SRF - subretinal fluid. EZ - ellipsoid zone. ERM - epiretinal membrane. ORA - outer retinal atrophy. ORT - outer retinal tubulation. SRHM - subretinal hyper-reflective material. IRHM - intraretinal hyper-reflective material            ASSESSMENT/PLAN:    ICD-10-CM   1. Macular hole of left eye  H35.342 OCT,  Retina - OU - Both Eyes    2. Essential hypertension  I10     3. Hypertensive retinopathy of both eyes  H35.033     4. Pseudophakia, both eyes  Z96.1      Macular hole, left eye   - OCT shows stable, thin FTMH, mild interval improvement in surrounding cystic changes  - BCVA slightly improved -- 20/70 from 20/100 - recommend 25g PPV w/ membrane peel and gas OS under general anesthesia - pt wishes to proceed with surgery - RBA of procedure discussed, questions answered - informed consent obtained and signed on 09.08.23 - surgery scheduled for Thursday, October 12, [facility] OR 8, 11:30AM - f/u October 13 -- POD1 OD  2,3. Hypertensive retinopathy OU - discussed importance of tight BP control - monitor  4. Pseudophakia OU  - s/p CE/IOL OU (Dr. Talbert Forest)  - IOLs in good position, doing well  - monitor  Ophthalmic Meds Ordered this visit:  No orders of the defined types were placed in this encounter.    Return in about 2 weeks (around 05/26/2022) for f/u [place] OS, DFE.  There are no Patient Instructions on file for this visit.   Explained the diagnoses, plan, and follow up with the patient and they expressed understanding.  Patient expressed understanding of the importance of proper follow up care.   This document serves as a record of services personally performed by Gardiner Sleeper, MD, PhD. It was created on their behalf by San Jetty. Owens Shark, OA an ophthalmic technician. The creation of this record is the provider's dictation and/or activities during the visit.    Electronically signed by: San Jetty. Owens Shark, [place] 09.22.2023 12:14 PM  Gardiner Sleeper, M.D., Ph.D. Diseases & Surgery of the Retina and Vitreous [facility]  I have reviewed the above documentation for accuracy and completeness, and I agree with the above. Gardiner Sleeper, M.D., Ph.D. 05/12/22 12:16 PM   Abbreviations: M myopia (nearsighted); A astigmatism; H hyperopia (farsighted); P presbyopia; Mrx  spectacle prescription;  CTL contact lenses; OD right eye; OS left eye; OU both eyes  XT exotropia; ET esotropia; PEK punctate epithelial keratitis; PEE punctate epithelial erosions; DES dry eye syndrome; MGD meibomian gland dysfunction; ATs artificial tears; PFAT's preservative free artificial tears; [place] nuclear sclerotic cataract; PSC posterior subcapsular cataract; ERM epi-retinal membrane; PVD posterior vitreous detachment; RD retinal detachment; DM diabetes mellitus; DR diabetic retinopathy; NPDR non-proliferative diabetic retinopathy; PDR proliferative diabetic retinopathy; CSME clinically significant macular edema; DME diabetic macular edema; dbh dot blot hemorrhages; CWS cotton wool spot; POAG primary open angle glaucoma; C/D cup-to-disc ratio; HVF humphrey visual field; GVF goldmann visual field; OCT optical coherence tomography; IOP intraocular pressure; BRVO Branch retinal vein occlusion; CRVO central retinal vein occlusion; CRAO central retinal artery occlusion; BRAO branch retinal artery occlusion; RT retinal tear; SB scleral buckle; PPV pars plana vitrectomy;  VH Vitreous hemorrhage; PRP panretinal laser photocoagulation; IVK intravitreal kenalog; VMT vitreomacular traction; MH Macular hole;  NVD neovascularization of the disc; NVE neovascularization elsewhere; AREDS age related eye disease study; ARMD age related macular degeneration; POAG primary open angle glaucoma; EBMD epithelial/anterior basement membrane dystrophy; ACIOL anterior chamber intraocular lens; IOL intraocular lens; PCIOL posterior chamber intraocular lens; Phaco/IOL phacoemulsification with intraocular lens placement; [place] photorefractive keratectomy; LASIK laser assisted in situ keratomileusis; HTN hypertension; DM diabetes mellitus; COPD chronic obstructive pulmonary disease

## 2022-05-12 ENCOUNTER — Ambulatory Visit (INDEPENDENT_AMBULATORY_CARE_PROVIDER_SITE_OTHER): Payer: Medicare HMO | Admitting: Ophthalmology

## 2022-05-12 ENCOUNTER — Encounter (INDEPENDENT_AMBULATORY_CARE_PROVIDER_SITE_OTHER): Payer: Self-pay | Admitting: Ophthalmology

## 2022-05-12 DIAGNOSIS — Z961 Presence of intraocular lens: Secondary | ICD-10-CM

## 2022-05-12 DIAGNOSIS — H35342 Macular cyst, hole, or pseudohole, left eye: Secondary | ICD-10-CM

## 2022-05-12 DIAGNOSIS — H35033 Hypertensive retinopathy, bilateral: Secondary | ICD-10-CM

## 2022-05-12 DIAGNOSIS — I10 Essential (primary) hypertension: Secondary | ICD-10-CM

## 2022-05-19 NOTE — Progress Notes (Signed)
[facility] Note  05/26/2022     CHIEF COMPLAINT Patient presents for Post-op Follow-up   HISTORY OF PRESENT ILLNESS: Anthony Hebert is a 71 y.o. male who presents to the clinic today for:   HPI     Post-op Follow-up   In left eye.  Vision is blurred at distance.  I, the attending physician,  performed the HPI with the patient and updated documentation appropriately.        Comments   Pt is a s/p [place] OS pt is reporting blurred vision and has a scratching sensation he denies any pain or discomfort       Last edited by Bernarda Caffey, MD on 05/26/2022  8:47 AM.    Pt states he had a hard time walking after surgery yesterday due to having to lay flat   Referring physician: Aura Dials, MD [place],  [place] [address]  HISTORICAL INFORMATION:   Selected notes from the MEDICAL RECORD NUMBER Referred by Dr. Lucita Ferrara for mac hole OS LEE:  Ocular Hx- PMH-    CURRENT MEDICATIONS: Current Outpatient Medications (Ophthalmic Drugs)  Medication Sig   Difluprednate 0.05 % EMUL Place 1 drop into the left eye 4 (four) times daily.   Polyvinyl Alcohol-Povidone (REFRESH OP) Place 1 drop into both eyes daily as needed (dry eyes).   PROLENSA 0.07 % SOLN Place 1 drop into the left eye 2 (two) times daily.   RESTASIS 0.05 % ophthalmic emulsion Place 1 drop into both eyes 2 (two) times daily.   No current facility-administered medications for this visit. (Ophthalmic Drugs)   Current Outpatient Medications (Other)  Medication Sig   allopurinol (ZYLOPRIM) 100 MG tablet Take 200 mg by mouth daily.   bismuth subsalicylate (PEPTO BISMOL) 262 MG chewable tablet Chew 262 mg by mouth as needed for indigestion.   gabapentin (NEURONTIN) 100 MG capsule Take 100 mg by mouth 3 (three) times daily.   lisinopril (ZESTRIL) 20 MG tablet Take 20 mg by mouth daily.   methocarbamol (ROBAXIN) 500 MG tablet Take 1 tablet (500 mg total) by mouth every 6  (six) hours as needed for muscle spasms.   oxyCODONE (ROXICODONE) 5 MG immediate release tablet Take 1-2 tablets (5-10 mg total) by mouth every 6 (six) hours as needed for severe pain.   rosuvastatin (CRESTOR) 20 MG tablet Take 1 tablet (20 mg total) by mouth daily.   traMADol (ULTRAM) 50 MG tablet Take 1-2 tablets (50-100 mg total) by mouth every 6 (six) hours as needed for moderate pain.   No current facility-administered medications for this visit. (Other)   REVIEW OF SYSTEMS:   ALLERGIES No Known Allergies  PAST MEDICAL HISTORY Past Medical History:  Diagnosis Date   Acute idiopathic gout of right ankle    Aortic valve sclerosis    Arthritis    "all over"   Chronic back pain    "all over"   Complication of anesthesia    slow to wake up at times   DDD (degenerative disc disease), lumbar    Difficult intubation    had trouble with intubation shape of throat;last sx no problems per pt; 03/28/16 successful oral ETT with green Glidescope, stylet. Has limited oral opening/small glottic opening   Foot swelling    in [place] 07/2013   GERD (gastroesophageal reflux disease)    occ   Gout of left foot 10/2011   in [place]   Heart murmur  states no problems   History of melanoma    Hypercholesterolemia    Hyperlipidemia    Hypertension    Melanoma ([place]) ~ 2010   "above left eye"   Nonrheumatic aortic valve stenosis    Other chronic pain    Primary osteoarthritis involving multiple joints    Rising PSA level    Screening for prostate cancer    Stroke [facility])    "maybe w/my 1st back OR in the 1990's"; denies residual on 07/15/2015   Use of cane as ambulatory aid    straight   Walker as ambulation aid    Past Surgical History:  Procedure Laterality Date   25 GAUGE PARS PLANA VITRECTOMY WITH 20 GAUGE MVR PORT FOR MACULAR HOLE Left 05/25/2022   Procedure: 25 GAUGE PARS PLANA VITRECTOMY WITH 20 GAUGE MVR PORT FOR MACULAR HOLE;  Surgeon: Bernarda Caffey, MD;  Location: [place];  Service:  Ophthalmology;  Laterality: Left;   APPENDECTOMY     BACK SURGERY     CARPAL TUNNEL RELEASE Right 07/15/2015   w/GUYON'S CANAL RELEASE    CARPAL TUNNEL RELEASE Right 07/15/2015   Procedure: RIGHT FOREARM AND WRIST CARPAL TUNNEL RELEASE;  Surgeon: Roseanne Kaufman, MD;  Location: [place];  Service: Orthopedics;  Laterality: Right;   CARPAL TUNNEL RELEASE Left 03/22/2017   Procedure: OPEN CARPAL TUNNEL RELEASE;  Surgeon: Roseanne Kaufman, MD;  Location: [place];  Service: Orthopedics;  Laterality: Left;  60 mins   COLONOSCOPY     COLONOSCOPY WITH PROPOFOL N/A 01/05/2022   Procedure: COLONOSCOPY WITH PROPOFOL;  Surgeon: Milus Banister, MD;  [facility];  Service: Gastroenterology;  Laterality: N/A;   FOOT SURGERY Right    cut nerve to relieve pain   GAS INSERTION Left 05/25/2022   Procedure: INSERTION OF GAS;  Surgeon: Bernarda Caffey, MD;  Location: [place];  Service: Ophthalmology;  Laterality: Left;   HEMOSTASIS CLIP PLACEMENT  01/05/2022   Procedure: HEMOSTASIS CLIP PLACEMENT;  Surgeon: Milus Banister, MD;  Location: [facility];  Service: Gastroenterology;;   INGUINAL HERNIA REPAIR Left [place] N/A 03/28/2016   Procedure: INSERTION OF MESH;  Surgeon: Rolm Bookbinder, MD;  Location: [place];  Service: General;  Laterality: N/A;   MELANOMA EXCISION     MEMBRANE PEEL Left 05/25/2022   Procedure: MEMBRANE PEEL;  Surgeon: Bernarda Caffey, MD;  Location: [place];  Service: Ophthalmology;  Laterality: Left;   POLYPECTOMY  01/05/2022   Procedure: POLYPECTOMY;  Surgeon: Milus Banister, MD;  Location: [facility];  Service: Gastroenterology;;   POSTERIOR CERVICAL LAMINECTOMY Right 10/07/2013   Procedure: POSTERIOR CERVICAL LAMINECTOMY RIGHT CERVICAL SEVEN -THORACIC ONE FORAMINOTOMY;  Surgeon: Faythe Ghee, MD;  Location: [place] [facility];  Service: Neurosurgery;  Laterality: Right;   POSTERIOR LUMBAR FUSION  X 7   SPINAL CORD STIMULATOR IMPLANT  ~ 2012   SPINAL CORD STIMULATOR  REMOVAL N/A 09/19/2012   Procedure: LUMBAR SPINAL CORD STIMULATOR REMOVAL;  Surgeon: Faythe Ghee, MD;  Location: [place] [facility];  Service: Neurosurgery;  Laterality: N/A;   TONSILLECTOMY     TOTAL HIP ARTHROPLASTY Right 05/01/2022   Procedure: TOTAL HIP ARTHROPLASTY ANTERIOR APPROACH;  Surgeon: Gaynelle Arabian, MD;  [facility];  Service: Orthopedics;  Laterality: Right;   ULNAR NERVE TRANSPOSITION Right 07/15/2015   supercharged AIN to deep motor branch ulnar nerve nerve transfer   UMBILICAL HERNIA REPAIR N/A 03/28/2016   Procedure: LAPAROSCOPIC UMBILICAL HERNIA;  Surgeon: Rolm Bookbinder, MD;  [facility];  Service: General;  Laterality: N/A;   FAMILY HISTORY Family History  Problem Relation Age of Onset   Cancer Mother    Alzheimer's disease Father    Colon cancer Neg Hx    Colon polyps Neg Hx    Esophageal cancer Neg Hx    Stomach cancer Neg Hx    Rectal cancer Neg Hx    SOCIAL HISTORY Social History   Tobacco Use   Smoking status: Never   Smokeless tobacco: Never  Vaping Use   Vaping Use: Never used  Substance Use Topics   Alcohol use: Yes    Alcohol/week: 1.0 - 2.0 standard drink of alcohol    Types: 1 - 2 Cans of beer per week    Comment: socially   Drug use: No       OPHTHALMIC EXAM:  Base Eye Exam     Visual Acuity (Snellen - Linear)       Right Left   Dist [place]  HM   Dist ph [place]  NI         Tonometry (Tonopen, 8:22 AM)       Right Left   Pressure  13         Pupils       Pupils Dark Shape   Right PERRL     Left PERRL dilated Round         Neuro/Psych     Oriented x3: Yes   Mood/Affect: Normal           Slit Lamp and Fundus Exam     Slit Lamp Exam       Right Left   Lids/Lashes Dermatochalasis - upper lid Dermatochalasis - upper lid   Conjunctiva/Sclera White and quiet nasal and temporal pinguecula, sutures intact   Cornea mild arcus, tear film debris, well healed cataract wound arcus, trace PEE, well healed cataract  wound, mild endo heme   Anterior Chamber deep and clear deep, 3+cell   Iris Round and dilated Round and moderately dilated to 6.25   Lens PC IOL in good position PC IOL in good position   Anterior Vitreous mild syneresis post vitrectomy, good gas fill         Fundus Exam       Right Left   Disc mild Pallor, Sharp rim Pink and Sharp, focal PPP IT rim   C/D Ratio 0.2 0.3   Macula Flat, Good foveal reflex, RPE mottling, No heme or edema flat under gas   Vessels mild attenuation, mild tortuosity mild attenuation, mild tortuosity   Periphery Attached Attached, good 360 peripheral laser changes           IMAGING AND PROCEDURES  Imaging and Procedures for 05/26/2022          ASSESSMENT/PLAN:    ICD-10-CM   1. Macular hole of left eye  H35.342     2. Essential hypertension  I10     3. Hypertensive retinopathy of both eyes  H35.033     4. Pseudophakia, both eyes  Z96.1      Macular hole, left eye   - pre op OCT shows stable, thin FTMH, mild interval improvement in surrounding cystic changes  - pre op BCVA 20/70 - POD1 s/p PPV/TissueBlue/MP/14% C3F8 OS, 10.12.23             - doing well this morning             - retina attached and good gas bubble in  place w/ mac hole closing             - IOP okay at 13             - start   PF 6x/day OS                          zymaxid QID OS                          Atropine BID OS                         PSO ung QID OS              - cont face down positioning x3 days then can decrease positioning to 50% of time; avoid laying flat on back              - eye shield when sleeping              - post op drop and positioning instructions reviewed              - tylenol/ibuprofen for pain  - f/u next Thursday  2,3. Hypertensive retinopathy OU - discussed importance of tight BP control - monitor  4. Pseudophakia OU  - s/p CE/IOL OU (Dr. Talbert Forest)  - IOLs in good position, doing well  - monitor  Ophthalmic Meds Ordered this  visit:  No orders of the defined types were placed in this encounter.    Return in about 6 days (around 06/01/2022) for f/u [place] OS, POV, DFE.  There are no Patient Instructions on file for this visit.   Explained the diagnoses, plan, and follow up with the patient and they expressed understanding.  Patient expressed understanding of the importance of proper follow up care.   This document serves as a record of services personally performed by Gardiner Sleeper, MD, PhD. It was created on their behalf by San Jetty. Owens Shark, OA an ophthalmic technician. The creation of this record is the provider's dictation and/or activities during the visit.    Electronically signed by: San Jetty. Owens Shark, [place] 10.06.2023 11:53 AM\  Gardiner Sleeper, M.D., Ph.D. Diseases & Surgery of the Retina and Vitreous [facility]  I have reviewed the above documentation for accuracy and completeness, and I agree with the above. Gardiner Sleeper, M.D., Ph.D. 05/27/22 11:55 AM  Abbreviations: M myopia (nearsighted); A astigmatism; H hyperopia (farsighted); P presbyopia; Mrx spectacle prescription;  CTL contact lenses; OD right eye; OS left eye; OU both eyes  XT exotropia; ET esotropia; PEK punctate epithelial keratitis; PEE punctate epithelial erosions; DES dry eye syndrome; MGD meibomian gland dysfunction; ATs artificial tears; PFAT's preservative free artificial tears; Ensign nuclear sclerotic cataract; PSC posterior subcapsular cataract; ERM epi-retinal membrane; PVD posterior vitreous detachment; RD retinal detachment; DM diabetes mellitus; DR diabetic retinopathy; NPDR non-proliferative diabetic retinopathy; PDR proliferative diabetic retinopathy; CSME clinically significant macular edema; DME diabetic macular edema; dbh dot blot hemorrhages; CWS cotton wool spot; POAG primary open angle glaucoma; C/D cup-to-disc ratio; HVF humphrey visual field; GVF goldmann visual field; OCT optical coherence tomography; IOP  intraocular pressure; BRVO Branch retinal vein occlusion; CRVO central retinal vein occlusion; CRAO central retinal artery occlusion; BRAO branch retinal artery occlusion; RT retinal tear; SB scleral buckle; PPV pars plana vitrectomy; VH Vitreous hemorrhage; PRP panretinal laser photocoagulation;  IVK intravitreal kenalog; VMT vitreomacular traction; MH Macular hole;  NVD neovascularization of the disc; NVE neovascularization elsewhere; AREDS age related eye disease study; ARMD age related macular degeneration; POAG primary open angle glaucoma; EBMD epithelial/anterior basement membrane dystrophy; ACIOL anterior chamber intraocular lens; IOL intraocular lens; PCIOL posterior chamber intraocular lens; Phaco/IOL phacoemulsification with intraocular lens placement; [place] photorefractive keratectomy; LASIK laser assisted in situ keratomileusis; HTN hypertension; DM diabetes mellitus; COPD chronic obstructive pulmonary disease

## 2022-05-22 NOTE — H&P (Signed)
Anthony Hebert is an 71 y.o. male.    Chief Complaint: macular hole, LEFT EYE  HPI: Pt with history of decreased central vision OS for several months. On dilated exam, found to have a small full thickness macular hole of the left eye affecting his activities of daily living. After a discussion of the risks, benefits and alternatives to surgery, the patient has elected to proceed with surgical repair of the macular hole OS -- 25g PPV w/ ILM peel and gas OS under general anesthesia.  Past Medical History:  Diagnosis Date   Acute idiopathic gout of right ankle    Aortic valve sclerosis    Arthritis    "all over"   Chronic back pain    "all over"   Complication of anesthesia    slow to wake up at times   DDD (degenerative disc disease), lumbar    Difficult intubation    had trouble with intubation shape of throat;last sx no problems per pt; 03/28/16 successful oral ETT with green Glidescope, stylet. Has limited oral opening/small glottic opening   Foot swelling    in [place] 07/2013   GERD (gastroesophageal reflux disease)    occ   Gout    Gout of left foot    Heart murmur    states no problems   History of melanoma    Hypercholesterolemia    Hyperlipidemia    Hypertension    Melanoma ([place]) ~ 2010   "above left eye"   Nonrheumatic aortic valve stenosis    Other chronic pain    Primary osteoarthritis involving multiple joints    Rising PSA level    Screening for prostate cancer    Stroke [facility])    "maybe w/my 1st back OR in the 1990's"; denies residual on 07/15/2015    Past Surgical History:  Procedure Laterality Date   APPENDECTOMY     BACK SURGERY     CARPAL TUNNEL RELEASE Right 07/15/2015   w/GUYON'S CANAL RELEASE    CARPAL TUNNEL RELEASE Right 07/15/2015   Procedure: RIGHT FOREARM AND WRIST CARPAL TUNNEL RELEASE;  Surgeon: Roseanne Kaufman, MD;  Location: [place];  Service: Orthopedics;  Laterality: Right;   CARPAL TUNNEL RELEASE Left 03/22/2017   Procedure: OPEN CARPAL TUNNEL  RELEASE;  Surgeon: Roseanne Kaufman, MD;  Location: [place];  Service: Orthopedics;  Laterality: Left;  60 mins   COLONOSCOPY     COLONOSCOPY WITH PROPOFOL N/A 01/05/2022   Procedure: COLONOSCOPY WITH PROPOFOL;  Surgeon: Milus Banister, MD;  [facility];  Service: Gastroenterology;  Laterality: N/A;   FOOT SURGERY Right    cut nerve to relieve pain   HEMOSTASIS CLIP PLACEMENT  01/05/2022   Procedure: HEMOSTASIS CLIP PLACEMENT;  Surgeon: Milus Banister, MD;  Location: [facility];  Service: Gastroenterology;;   INGUINAL HERNIA REPAIR Left [place] N/A 03/28/2016   Procedure: INSERTION OF MESH;  Surgeon: Rolm Bookbinder, MD;  Location: [place];  Service: General;  Laterality: N/A;   MELANOMA EXCISION     POLYPECTOMY  01/05/2022   Procedure: POLYPECTOMY;  Surgeon: Milus Banister, MD;  Location: [facility];  Service: Gastroenterology;;   POSTERIOR CERVICAL LAMINECTOMY Right 10/07/2013   Procedure: POSTERIOR CERVICAL LAMINECTOMY RIGHT CERVICAL SEVEN -THORACIC ONE FORAMINOTOMY;  Surgeon: Faythe Ghee, MD;  [facility]: [facility];  Service: Neurosurgery;  Laterality: Right;   POSTERIOR LUMBAR FUSION  X 7   SPINAL CORD STIMULATOR IMPLANT  ~ 2012   SPINAL CORD STIMULATOR  REMOVAL N/A 09/19/2012   Procedure: LUMBAR SPINAL CORD STIMULATOR REMOVAL;  Surgeon: Faythe Ghee, MD;  Location: [place] [facility];  Service: Neurosurgery;  Laterality: N/A;   TONSILLECTOMY     TOTAL HIP ARTHROPLASTY Right 05/01/2022   Procedure: TOTAL HIP ARTHROPLASTY ANTERIOR APPROACH;  Surgeon: Gaynelle Arabian, MD;  [facility];  Service: Orthopedics;  Laterality: Right;   ULNAR NERVE TRANSPOSITION Right 07/15/2015   supercharged AIN to deep motor branch ulnar nerve nerve transfer   UMBILICAL HERNIA REPAIR N/A 03/28/2016   Procedure: LAPAROSCOPIC UMBILICAL HERNIA;  Surgeon: Rolm Bookbinder, MD;  Location: [place];  Service: General;  Laterality: N/A;    Family History  Problem Relation Age of  Onset   Cancer Mother    Alzheimer's disease Father    Colon cancer Neg Hx    Colon polyps Neg Hx    Esophageal cancer Neg Hx    Stomach cancer Neg Hx    Rectal cancer Neg Hx    Social History:  reports that he has never smoked. He has never used smokeless tobacco. He reports current alcohol use of about 1.0 - 2.0 standard drink of alcohol per week. He reports that he does not use drugs.  Allergies: No Known Allergies  No medications prior to admission.   Review of systems otherwise negative  There were no vitals taken for this visit.  Physical exam: Mental status: oriented x3. Eyes: See eye exam associated with this date of surgery Ears, Nose, Throat: within normal limits Neck: Within Normal limits General: within normal limits Chest: Within normal limits Breast: deferred Heart: Within normal limits Abdomen: Within normal limits GU: deferred Extremities: within normal limits Skin: within normal limits  Assessment/Plan Full thickness macular hole, LEFT EYE  Plan: To [facility] for 25g PPV w/ ILM peel + gas, LEFT EYE, under general anesthesia - case scheduled for Thursday, May 25, 1129 am -- [facility] OR 08  Gardiner Sleeper, M.D., Ph.D. Vitreoretinal Surgeon [facility]

## 2022-05-24 ENCOUNTER — Other Ambulatory Visit: Payer: Self-pay

## 2022-05-24 ENCOUNTER — Encounter (HOSPITAL_COMMUNITY): Payer: Self-pay | Admitting: Ophthalmology

## 2022-05-24 NOTE — Progress Notes (Signed)
Anesthesia Chart Review: Same day workup  Follows with cardiology for history of HTN, HLD, mild aortic valve stenosis.  Echo 06/2021 showed EF 60 to 65%, normal RV, no regional wall motion abnormalities, moderate calcification of aortic valve with mild stenosis (mean gradient 12 mmHg).  He was last seen by Stephan Minister, NP 02/24/2022 for preop evaluation prior to undergoing right hip arthroplasty.  He was noted to be doing well at that time from cardiac standpoint and was cleared to proceed with surgery.  61-monthfollow-up recommended.  He did subsequently undergo right THA on 96/30/1601without complication.  History of difficult intubation.  Per anesthesia intubation note 03/28/2016, " Limited space in oropharnyx, use green glide handle and stylet.  Small glottic opening.  Full beard but able to ventilate easily."  BMP and CBC from 05/02/2022 reviewed, WBC mildly elevated at 15.2, otherwise unremarkable.  EKG 02/24/2022: Sinus rhythm with occasional premature ventricular complexes.  Rate 68.    TTE 06/23/2021:  1. Left ventricular ejection fraction, by estimation, is 60 to 65%. The  left ventricle has normal function. The left ventricle has no regional  wall motion abnormalities. Left ventricular diastolic parameters were  normal.   2. Right ventricular systolic function is normal. The right ventricular  size is normal.   3. The mitral valve is normal in structure. No evidence of mitral valve  regurgitation. No evidence of mitral stenosis.   4. The aortic valve is calcified. There is moderate calcification of the  aortic valve. There is moderate thickening of the aortic valve. Aortic  valve regurgitation is not visualized. Mild aortic valve stenosis. Aortic  valve area, by VTI measures 1.32  cm. Aortic valve mean gradient measures 12.0 mmHg. Aortic valve Vmax  measures 2.42 m/s.     JWynonia MustyMUniversity Endoscopy CenterShort Stay Center/Anesthesiology Phone ([phone]/06/2022 10:31 AM

## 2022-05-24 NOTE — Anesthesia Preprocedure Evaluation (Addendum)
Anesthesia Evaluation  Patient identified by MRN, date of birth, ID band Patient awake    Reviewed: Allergy & Precautions, NPO status , Patient's Chart, lab work & pertinent test results  History of Anesthesia Complications (+) DIFFICULT AIRWAY and history of anesthetic complications  Airway Mallampati: III  TM Distance: >3 FB Neck ROM: Limited    Dental no notable dental hx.    Pulmonary neg pulmonary ROS   Pulmonary exam normal        Cardiovascular hypertension, + CAD  Normal cardiovascular exam  TTE 06/2021: EF 60-65%, mild AS   Neuro/Psych CVA (1990s), No Residual Symptoms  negative psych ROS   GI/Hepatic Neg liver ROS,GERD  Controlled,,  Endo/Other  negative endocrine ROS    Renal/GU negative Renal ROS  negative genitourinary   Musculoskeletal  (+) Arthritis ,    Abdominal   Peds  Hematology negative hematology ROS (+)   Anesthesia Other Findings Day of surgery medications reviewed with patient.  Reproductive/Obstetrics negative OB ROS                             Anesthesia Physical Anesthesia Plan  ASA: 2  Anesthesia Plan: General   Post-op Pain Management: Tylenol PO (pre-op)*   Induction: Intravenous  PONV Risk Score and Plan: 2 and Treatment may vary due to age or medical condition, Ondansetron, Dexamethasone and Midazolam  Airway Management Planned: Oral ETT and Video Laryngoscope Planned  Additional Equipment: None  Intra-op Plan:   Post-operative Plan: Extubation in OR  Informed Consent: I have reviewed the patients History and Physical, chart, labs and discussed the procedure including the risks, benefits and alternatives for the proposed anesthesia with the patient or authorized representative who has indicated his/her understanding and acceptance.     Dental advisory given  Plan Discussed with: CRNA  Anesthesia Plan Comments: (PAT note by Karoline Caldwell,  PA-C: Follows with cardiology for history of HTN, HLD, mild aortic valve stenosis.  Echo 06/2021 showed EF 60 to 65%, normal RV, no regional wall motion abnormalities, moderate calcification of aortic valve with mild stenosis (mean gradient 12 mmHg).  He was last seen by Stephan Minister, NP 02/24/2022 for preop evaluation prior to undergoing right hip arthroplasty.  He was noted to be doing well at that time from cardiac standpoint and was cleared to proceed with surgery.  96-monthfollow-up recommended.  He did subsequently undergo right THA on 92/58/5277without complication.  History of difficult intubation.  Per anesthesia intubation note 03/28/2016, "Limited space in oropharnyx, use green glide handle and stylet. Small glottic opening. Full beard but able to ventilate easily."  BMP and CBC from 05/02/2022 reviewed, WBC mildly elevated at 15.2, otherwise unremarkable.  EKG 02/24/2022: Sinus rhythm with occasional premature ventricular complexes.  Rate 68.    TTE 06/23/2021: 1. Left ventricular ejection fraction, by estimation, is 60 to 65%. The  left ventricle has normal function. The left ventricle has no regional  wall motion abnormalities. Left ventricular diastolic parameters were  normal.  2. Right ventricular systolic function is normal. The right ventricular  size is normal.  3. The mitral valve is normal in structure. No evidence of mitral valve  regurgitation. No evidence of mitral stenosis.  4. The aortic valve is calcified. There is moderate calcification of the  aortic valve. There is moderate thickening of the aortic valve. Aortic  valve regurgitation is not visualized. Mild aortic valve stenosis. Aortic  valve area, by VTI  measures 1.32  cm. Aortic valve mean gradient measures 12.0 mmHg. Aortic valve Vmax  measures 2.42 m/s.  )        Anesthesia Quick Evaluation

## 2022-05-24 NOTE — Progress Notes (Signed)
PCP - [facility] Cardiologist - Christen Bame, NP   Chest x-ray - n/a EKG - 02/24/22 Stress Test - n/a ECHO - 06/23/21   Cardiac Cath - n/a  ICD Pacemaker/Loop - n/a  Sleep Study -  n/a CPAP - none  ERAS: Clear liquids til 8:30 AM DOS.  Anesthesia review: Yes  STOP now taking any Aspirin (unless otherwise instructed by your surgeon), Aleve, Naproxen, Ibuprofen, Motrin, Advil, Goody's, BC's, all herbal medications, fish oil, and all vitamins.   Coronavirus Screening Do you have any of the following symptoms:  Cough yes/no: No Fever (>100.75F)  yes/no: No Runny nose yes/no: No Sore throat yes/no: No Difficulty breathing/shortness of breath  yes/no: No  Have you traveled in the last 14 days and where? yes/no: No  Patient verbalized understanding of instructions that were given via phone.  History of difficult intubation.  Per anesthesia intubation note 03/28/2016.  (Limited space in oropharnyx, use green glide handle and stylet.  Small glottic opening.  Full beard but able to ventilate easily.)

## 2022-05-25 ENCOUNTER — Encounter (HOSPITAL_COMMUNITY)
Admission: RE | Disposition: A | Discharge status: Home / Self Care | Payer: Self-pay | Source: Ambulatory Visit | Attending: Ophthalmology

## 2022-05-25 ENCOUNTER — Ambulatory Visit (HOSPITAL_BASED_OUTPATIENT_CLINIC_OR_DEPARTMENT_OTHER): Payer: Medicare HMO | Admitting: Physician Assistant

## 2022-05-25 ENCOUNTER — Encounter (HOSPITAL_COMMUNITY): Payer: Self-pay | Admitting: Ophthalmology

## 2022-05-25 ENCOUNTER — Ambulatory Visit (HOSPITAL_COMMUNITY): Payer: Medicare HMO | Admitting: Physician Assistant

## 2022-05-25 ENCOUNTER — Ambulatory Visit (HOSPITAL_COMMUNITY)
Admission: RE | Admit: 2022-05-25 | Discharge: 2022-05-25 | Disposition: A | Discharge status: Home / Self Care | Payer: Medicare HMO | Source: Ambulatory Visit | Attending: Ophthalmology | Admitting: Ophthalmology

## 2022-05-25 DIAGNOSIS — I350 Nonrheumatic aortic (valve) stenosis: Secondary | ICD-10-CM | POA: Insufficient documentation

## 2022-05-25 DIAGNOSIS — H3589 Other specified retinal disorders: Secondary | ICD-10-CM | POA: Diagnosis not present

## 2022-05-25 DIAGNOSIS — H33302 Unspecified retinal break, left eye: Secondary | ICD-10-CM | POA: Diagnosis not present

## 2022-05-25 DIAGNOSIS — Z8673 Personal history of transient ischemic attack (TIA), and cerebral infarction without residual deficits: Secondary | ICD-10-CM | POA: Diagnosis not present

## 2022-05-25 DIAGNOSIS — Z96641 Presence of right artificial hip joint: Secondary | ICD-10-CM | POA: Diagnosis not present

## 2022-05-25 DIAGNOSIS — E785 Hyperlipidemia, unspecified: Secondary | ICD-10-CM | POA: Insufficient documentation

## 2022-05-25 DIAGNOSIS — H35342 Macular cyst, hole, or pseudohole, left eye: Secondary | ICD-10-CM | POA: Diagnosis not present

## 2022-05-25 DIAGNOSIS — I10 Essential (primary) hypertension: Secondary | ICD-10-CM | POA: Diagnosis not present

## 2022-05-25 HISTORY — PX: 25 GAUGE PARS PLANA VITRECTOMY WITH 20 GAUGE MVR PORT FOR MACULAR HOLE: SHX6096

## 2022-05-25 HISTORY — PX: GAS INSERTION: SHX5336

## 2022-05-25 HISTORY — DX: Dependence on other enabling machines and devices: Z99.89

## 2022-05-25 HISTORY — PX: MEMBRANE PEEL: SHX5967

## 2022-05-25 LAB — CBC
HCT: 32.8 % — ABNORMAL LOW (ref 39.0–52.0)
Hemoglobin: 11 g/dL — ABNORMAL LOW (ref 13.0–17.0)
MCH: 30.6 pg (ref 26.0–34.0)
MCHC: 33.5 g/dL (ref 30.0–36.0)
MCV: 91.4 fL (ref 80.0–100.0)
Platelets: 222 10*3/uL (ref 150–400)
RBC: 3.59 MIL/uL — ABNORMAL LOW (ref 4.22–5.81)
RDW: 12.2 % (ref 11.5–15.5)
WBC: 9.2 10*3/uL (ref 4.0–10.5)
nRBC: 0 % (ref 0.0–0.2)

## 2022-05-25 LAB — BASIC METABOLIC PANEL
Anion gap: 9 (ref 5–15)
BUN: 24 mg/dL — ABNORMAL HIGH (ref 8–23)
CO2: 23 mmol/L (ref 22–32)
Calcium: 9.3 mg/dL (ref 8.9–10.3)
Chloride: 105 mmol/L (ref 98–111)
Creatinine, Ser: 1.04 mg/dL (ref 0.61–1.24)
GFR, Estimated: >60 mL/min (ref >60)
Glucose, Bld: 105 mg/dL — ABNORMAL HIGH (ref 70–99)
Potassium: 4.5 mmol/L (ref 3.5–5.1)
Sodium: 137 mmol/L (ref 135–145)

## 2022-05-25 SURGERY — 25 GAUGE PARS PLANA VITRECTOMY WITH 20 GAUGE MVR PORT FOR MACULAR HOLE
Anesthesia: General | Site: Eye | Laterality: Left

## 2022-05-25 MED ORDER — BACITRACIN-POLYMYXIN B 500-10000 UNIT/GM OP OINT
TOPICAL_OINTMENT | OPHTHALMIC | Status: DC | PRN
  Administered 2022-05-25: 1 via OPHTHALMIC

## 2022-05-25 MED ORDER — PHENYLEPHRINE HCL 10 % OP SOLN
1.0000 [drp] | OPHTHALMIC | Status: AC | PRN
  Administered 2022-05-25 (×3): 1 [drp] via OPHTHALMIC
  Filled 2022-05-25: qty 5

## 2022-05-25 MED ORDER — SUCCINYLCHOLINE CHLORIDE 200 MG/10ML IV SOSY
PREFILLED_SYRINGE | INTRAVENOUS | Status: AC
  Filled 2022-05-25: qty 10

## 2022-05-25 MED ORDER — CEFTAZIDIME 1 G IJ SOLR
INTRAMUSCULAR | Status: AC
  Filled 2022-05-25: qty 1

## 2022-05-25 MED ORDER — EPINEPHRINE PF 1 MG/ML IJ SOLN
INTRAMUSCULAR | Status: DC | PRN
  Administered 2022-05-25: .3 mL

## 2022-05-25 MED ORDER — ROCURONIUM BROMIDE 10 MG/ML (PF) SYRINGE
PREFILLED_SYRINGE | INTRAVENOUS | Status: AC
  Filled 2022-05-25: qty 10

## 2022-05-25 MED ORDER — PHENYLEPHRINE 80 MCG/ML (10ML) SYRINGE FOR IV PUSH (FOR BLOOD PRESSURE SUPPORT)
PREFILLED_SYRINGE | INTRAVENOUS | Status: DC | PRN
  Administered 2022-05-25: 80 ug via INTRAVENOUS

## 2022-05-25 MED ORDER — PREDNISOLONE ACETATE 1 % OP SUSP
OPHTHALMIC | Status: DC | PRN
  Administered 2022-05-25: 1 [drp] via OPHTHALMIC

## 2022-05-25 MED ORDER — CARBACHOL 0.01 % IO SOLN
INTRAOCULAR | Status: AC
  Filled 2022-05-25: qty 1.5

## 2022-05-25 MED ORDER — NA CHONDROIT SULF-NA HYALURON 40-30 MG/ML IO SOSY
INTRAOCULAR | Status: DC | PRN
  Administered 2022-05-25: 0.5 mL via INTRAOCULAR

## 2022-05-25 MED ORDER — DEXAMETHASONE SODIUM PHOSPHATE 10 MG/ML IJ SOLN
INTRAMUSCULAR | Status: DC | PRN
  Administered 2022-05-25: 10 mg via INTRAVENOUS

## 2022-05-25 MED ORDER — BRILLIANT BLUE G 0.025 % IO SOSY
0.5000 mL | PREFILLED_SYRINGE | INTRAOCULAR | Status: AC
  Administered 2022-05-25: .5 mL via INTRAVITREAL
  Filled 2022-05-25: qty 0.5

## 2022-05-25 MED ORDER — PROPOFOL 10 MG/ML IV BOLUS
INTRAVENOUS | Status: DC | PRN
  Administered 2022-05-25: 160 mg via INTRAVENOUS

## 2022-05-25 MED ORDER — BRIMONIDINE TARTRATE 0.2 % OP SOLN
OPHTHALMIC | Status: DC | PRN
  Administered 2022-05-25: 1 [drp] via OPHTHALMIC

## 2022-05-25 MED ORDER — TROPICAMIDE 1 % OP SOLN
1.0000 [drp] | OPHTHALMIC | Status: AC | PRN
  Administered 2022-05-25 (×3): 1 [drp] via OPHTHALMIC
  Filled 2022-05-25: qty 15

## 2022-05-25 MED ORDER — OXYCODONE HCL 5 MG/5ML PO SOLN: 5.0000 mg | Freq: Once | ORAL | Status: DC | PRN

## 2022-05-25 MED ORDER — FENTANYL CITRATE (PF) 100 MCG/2ML IJ SOLN
25.0000 ug | INTRAMUSCULAR | Status: DC | PRN
  Administered 2022-05-25 (×2): 50 ug via INTRAVENOUS

## 2022-05-25 MED ORDER — LIDOCAINE HCL (PF) 2 % IJ SOLN
INTRAMUSCULAR | Status: AC
  Filled 2022-05-25: qty 10

## 2022-05-25 MED ORDER — INDOCYANINE GREEN 25 MG IV SOLR
INTRAVENOUS | Status: AC
  Filled 2022-05-25: qty 10

## 2022-05-25 MED ORDER — POLYMYXIN B SULFATE 500000 UNITS IJ SOLR
INTRAMUSCULAR | Status: AC
  Filled 2022-05-25: qty 10

## 2022-05-25 MED ORDER — PROPARACAINE HCL 0.5 % OP SOLN
1.0000 [drp] | OPHTHALMIC | Status: AC | PRN
  Administered 2022-05-25 (×3): 1 [drp] via OPHTHALMIC
  Filled 2022-05-25: qty 15

## 2022-05-25 MED ORDER — DORZOLAMIDE HCL-TIMOLOL MAL 2-0.5 % OP SOLN
OPHTHALMIC | Status: AC
  Filled 2022-05-25: qty 10

## 2022-05-25 MED ORDER — CHLORHEXIDINE GLUCONATE 0.12 % MT SOLN
15.0000 mL | Freq: Once | OROMUCOSAL | Status: AC
  Administered 2022-05-25: 15 mL via OROMUCOSAL
  Filled 2022-05-25: qty 15

## 2022-05-25 MED ORDER — STERILE WATER FOR INJECTION IJ SOLN: INTRAMUSCULAR | Status: DC | PRN

## 2022-05-25 MED ORDER — SUGAMMADEX SODIUM 200 MG/2ML IV SOLN
INTRAVENOUS | Status: DC | PRN
  Administered 2022-05-25: 200 mg via INTRAVENOUS

## 2022-05-25 MED ORDER — DEXAMETHASONE SODIUM PHOSPHATE 10 MG/ML IJ SOLN
INTRAMUSCULAR | Status: AC
  Filled 2022-05-25: qty 1

## 2022-05-25 MED ORDER — HYALURONIDASE HUMAN 150 UNIT/ML IJ SOLN
INTRAMUSCULAR | Status: AC
  Filled 2022-05-25: qty 1

## 2022-05-25 MED ORDER — TRIAMCINOLONE ACETONIDE 40 MG/ML IJ SUSP
INTRAMUSCULAR | Status: DC | PRN
  Administered 2022-05-25: 40 mg

## 2022-05-25 MED ORDER — GATIFLOXACIN 0.5 % OP SOLN OPTIME - NO CHARGE
OPHTHALMIC | Status: DC | PRN
  Administered 2022-05-25: 1 [drp] via OPHTHALMIC

## 2022-05-25 MED ORDER — PHENYLEPHRINE HCL-NACL 20-0.9 MG/250ML-% IV SOLN
INTRAVENOUS | Status: DC | PRN
  Administered 2022-05-25: 40 ug/min via INTRAVENOUS

## 2022-05-25 MED ORDER — ATROPINE SULFATE 1 % OP SOLN
OPHTHALMIC | Status: AC
  Filled 2022-05-25: qty 5

## 2022-05-25 MED ORDER — EPINEPHRINE PF 1 MG/ML IJ SOLN
INTRAMUSCULAR | Status: AC
  Filled 2022-05-25: qty 1

## 2022-05-25 MED ORDER — TRIAMCINOLONE ACETONIDE 40 MG/ML IJ SUSP
INTRAMUSCULAR | Status: AC
  Filled 2022-05-25: qty 5

## 2022-05-25 MED ORDER — BSS IO SOLN
INTRAOCULAR | Status: AC
  Filled 2022-05-25: qty 15

## 2022-05-25 MED ORDER — FENTANYL CITRATE (PF) 250 MCG/5ML IJ SOLN
INTRAMUSCULAR | Status: AC
  Filled 2022-05-25: qty 5

## 2022-05-25 MED ORDER — ONDANSETRON HCL 4 MG/2ML IJ SOLN
INTRAMUSCULAR | Status: DC | PRN
  Administered 2022-05-25: 4 mg via INTRAVENOUS

## 2022-05-25 MED ORDER — GATIFLOXACIN 0.5 % OP SOLN
OPHTHALMIC | Status: AC
  Filled 2022-05-25: qty 2.5

## 2022-05-25 MED ORDER — ROCURONIUM BROMIDE 10 MG/ML (PF) SYRINGE
PREFILLED_SYRINGE | INTRAVENOUS | Status: DC | PRN
  Administered 2022-05-25: 40 mg via INTRAVENOUS
  Administered 2022-05-25: 10 mg via INTRAVENOUS

## 2022-05-25 MED ORDER — BUPIVACAINE HCL (PF) 0.75 % IJ SOLN
INTRAMUSCULAR | Status: AC
  Filled 2022-05-25: qty 10

## 2022-05-25 MED ORDER — DORZOLAMIDE HCL-TIMOLOL MAL 2-0.5 % OP SOLN
OPHTHALMIC | Status: DC | PRN
  Administered 2022-05-25: 1 [drp] via OPHTHALMIC

## 2022-05-25 MED ORDER — PREDNISOLONE ACETATE 1 % OP SUSP
OPHTHALMIC | Status: AC
  Filled 2022-05-25: qty 5

## 2022-05-25 MED ORDER — PHENYLEPHRINE 80 MCG/ML (10ML) SYRINGE FOR IV PUSH (FOR BLOOD PRESSURE SUPPORT)
PREFILLED_SYRINGE | INTRAVENOUS | Status: AC
  Filled 2022-05-25: qty 10

## 2022-05-25 MED ORDER — ATROPINE SULFATE 1 % OP SOLN
1.0000 [drp] | OPHTHALMIC | Status: AC | PRN
  Administered 2022-05-25 (×3): 1 [drp] via OPHTHALMIC
  Filled 2022-05-25: qty 5

## 2022-05-25 MED ORDER — FENTANYL CITRATE (PF) 250 MCG/5ML IJ SOLN
INTRAMUSCULAR | Status: DC | PRN
  Administered 2022-05-25: 100 ug via INTRAVENOUS

## 2022-05-25 MED ORDER — ORAL CARE MOUTH RINSE: 15.0000 mL | Freq: Once | OROMUCOSAL | Status: AC

## 2022-05-25 MED ORDER — NA CHONDROIT SULF-NA HYALURON 40-30 MG/ML IO SOSY
INTRAOCULAR | Status: AC
  Filled 2022-05-25: qty 0.5

## 2022-05-25 MED ORDER — BACITRACIN-POLYMYXIN B 500-10000 UNIT/GM OP OINT
TOPICAL_OINTMENT | OPHTHALMIC | Status: AC
  Filled 2022-05-25: qty 3.5

## 2022-05-25 MED ORDER — BSS PLUS IO SOLN
INTRAOCULAR | Status: AC
  Filled 2022-05-25: qty 500

## 2022-05-25 MED ORDER — LIDOCAINE 2% (20 MG/ML) 5 ML SYRINGE
INTRAMUSCULAR | Status: DC | PRN
  Administered 2022-05-25: 100 mg via INTRAVENOUS

## 2022-05-25 MED ORDER — BRIMONIDINE TARTRATE 0.2 % OP SOLN
OPHTHALMIC | Status: AC
  Filled 2022-05-25: qty 5

## 2022-05-25 MED ORDER — BSS PLUS IO SOLN
INTRAOCULAR | Status: DC | PRN
  Administered 2022-05-25: 1

## 2022-05-25 MED ORDER — SUCCINYLCHOLINE CHLORIDE 200 MG/10ML IV SOSY
PREFILLED_SYRINGE | INTRAVENOUS | Status: DC | PRN
  Administered 2022-05-25: 120 mg via INTRAVENOUS

## 2022-05-25 MED ORDER — LACTATED RINGERS IV SOLN: INTRAVENOUS | Status: DC

## 2022-05-25 MED ORDER — LIDOCAINE 2% (20 MG/ML) 5 ML SYRINGE
INTRAMUSCULAR | Status: AC
  Filled 2022-05-25: qty 5

## 2022-05-25 MED ORDER — OXYCODONE HCL 5 MG PO TABS: 5.0000 mg | ORAL_TABLET | Freq: Once | ORAL | Status: DC | PRN

## 2022-05-25 MED ORDER — FENTANYL CITRATE (PF) 100 MCG/2ML IJ SOLN
INTRAMUSCULAR | Status: AC
  Filled 2022-05-25: qty 2

## 2022-05-25 MED ORDER — STERILE WATER FOR IRRIGATION IR SOLN
Status: DC | PRN
  Administered 2022-05-25: 1000 mL

## 2022-05-25 MED ORDER — ONDANSETRON HCL 4 MG/2ML IJ SOLN
INTRAMUSCULAR | Status: AC
  Filled 2022-05-25: qty 2

## 2022-05-25 MED ORDER — ATROPINE SULFATE 1 % OP SOLN
OPHTHALMIC | Status: DC | PRN
  Administered 2022-05-25: 1 [drp] via OPHTHALMIC

## 2022-05-25 MED ORDER — STERILE WATER FOR INJECTION IJ SOLN
INTRAMUSCULAR | Status: DC | PRN
  Administered 2022-05-25: 20 mL

## 2022-05-25 SURGICAL SUPPLY — 48 items
APPLICATOR COTTON TIP 6 STRL (MISCELLANEOUS) ×4 IMPLANT
APPLICATOR COTTON TIP 6IN STRL (MISCELLANEOUS) ×4
BAND WRIST GAS GREEN (MISCELLANEOUS) IMPLANT
BNDG EYE OVAL (GAUZE/BANDAGES/DRESSINGS) ×1 IMPLANT
CABLE BIPOLOR RESECTION CORD (MISCELLANEOUS) ×1 IMPLANT
CANNULA FLEX TIP 25G (CANNULA) ×1 IMPLANT
CLSR STERI-STRIP ANTIMIC 1/2X4 (GAUZE/BANDAGES/DRESSINGS) ×1 IMPLANT
DRAPE MICROSCOPE LEICA 46X105 (MISCELLANEOUS) ×1 IMPLANT
DRAPE OPHTHALMIC 77X100 STRL (CUSTOM PROCEDURE TRAY) ×1 IMPLANT
ERASER HMR WETFIELD 23G BP (MISCELLANEOUS) IMPLANT
FILTER BLUE MILLIPORE (MISCELLANEOUS) IMPLANT
FILTER STRAW FLUID ASPIR (MISCELLANEOUS) IMPLANT
FORCEPS GRIESHABER ILM 25G A (INSTRUMENTS) IMPLANT
FORCEPS GRIESHABER MAX 25G (MISCELLANEOUS) IMPLANT
GAS AUTO FILL CONSTEL (OPHTHALMIC)
GAS AUTO FILL CONSTELLATION (OPHTHALMIC) IMPLANT
GAS WRIST BAND GREEN (MISCELLANEOUS) ×1
GLOVE BIO SURGEON STRL SZ7.5 (GLOVE) ×2 IMPLANT
GLOVE BIOGEL M 7.0 STRL (GLOVE) ×1 IMPLANT
GOWN STRL REUS W/ TWL LRG LVL3 (GOWN DISPOSABLE) ×2 IMPLANT
GOWN STRL REUS W/ TWL XL LVL3 (GOWN DISPOSABLE) ×1 IMPLANT
GOWN STRL REUS W/TWL LRG LVL3 (GOWN DISPOSABLE) ×2
GOWN STRL REUS W/TWL XL LVL3 (GOWN DISPOSABLE) ×1
KIT BASIN OR (CUSTOM PROCEDURE TRAY) ×1 IMPLANT
KIT PERFLUORON PROCEDURE 5ML (MISCELLANEOUS) IMPLANT
LENS VITRECTOMY FLAT OCLR DISP (MISCELLANEOUS) IMPLANT
LOOP FINESSE 25 GA (MISCELLANEOUS) IMPLANT
MICROPICK 25G (MISCELLANEOUS)
NDL 18GX1X1/2 (RX/OR ONLY) (NEEDLE) ×3 IMPLANT
NDL 25GX 5/8IN NON SAFETY (NEEDLE) ×2 IMPLANT
NEEDLE 18GX1X1/2 (RX/OR ONLY) (NEEDLE) ×3 IMPLANT
NEEDLE 25GX 5/8IN NON SAFETY (NEEDLE) ×2 IMPLANT
PACK VITRECTOMY CUSTOM (CUSTOM PROCEDURE TRAY) ×1 IMPLANT
PAD ARMBOARD 7.5X6 YLW CONV (MISCELLANEOUS) ×2 IMPLANT
PAK PIK VITRECTOMY CVS 25GA (OPHTHALMIC) ×1 IMPLANT
PICK MICROPICK 25G (MISCELLANEOUS) IMPLANT
PROBE ENDO DIATHERMY 25G (MISCELLANEOUS) ×1 IMPLANT
PROBE LASER ILLUM FLEX CVD 25G (OPHTHALMIC) IMPLANT
SHIELD EYE LENSE ONLY DISP (GAUZE/BANDAGES/DRESSINGS) IMPLANT
SOL ANTI FOG 6CC (MISCELLANEOUS) IMPLANT
SOLUTION ANTI FOG 6CC (MISCELLANEOUS) ×1
SUT VICRYL 7 0 TG140 8 (SUTURE) ×1 IMPLANT
SYR 10ML LL (SYRINGE) ×1 IMPLANT
SYR 20ML LL LF (SYRINGE) IMPLANT
SYR TB 1ML LUER SLIP (SYRINGE) ×1 IMPLANT
TOWEL GREEN STERILE FF (TOWEL DISPOSABLE) ×1 IMPLANT
TUBING HIGH PRESS EXTEN 6IN (TUBING) ×1 IMPLANT
WATER STERILE IRR 1000ML POUR (IV SOLUTION) ×1 IMPLANT

## 2022-05-25 NOTE — Op Note (Signed)
Date of procedure:  05/25/2022   Surgeon: Bernarda Caffey, MD, PhD   Assistant: Ernest Mallick, Ophthalmic Assistant   Pre-operative Diagnoses: Full thickness macular hole, Left Eye   Post-operative diagnosis: Full thickness macular hole, Left Eye Retinal defect, Left Eye   Anesthesia: General   Procedure:   1. 25 gauge pars plana vitrectomy, Left Eye 2. TissueBlue stain, Left Eye 3. Internal Limiting Membrane peel, Left Eye  4. Endolaser, Left Eye 5. Injection 14% C3F8, Left Eye     Indications for procedure: The patient presented with a full thickness macular hole and complaint of central visual loss consistent with a scotoma.  After discussing the risks, benefits, and alternatives to surgery, the patient electively decided to undergo surgical repair and informed consent was obtained.  The surgery was an attempt to close the macular hole and potentially improve the vision within the reasonable expectations of the surgeon.    Procedure in Detail:    The patient was met in the pre-operative holding area where their identification data was verified.  It was noted that there was a signed, informed consent in the chart and the Left Eye eye was verbally verified by the patient as the operative eye and was marked with a marking pen. The patient was then taken to the operating room and placed in the supine position. General endotracheal anesthesia was induced.   The eye was then prepped with 5% betadine and draped in the normal fashion for ophthalmic surgery. The microscope was draped and swung into position, and a secondary time-out was performed to identify the correct patient, eyes, procedures, and any allergies.   A 25 gauge trocar was inserted in a 30-45 degrees fashion into the inferotemporal quadrant ~4 mm posterior to the limbus in this pseudophakic patient.  Correct positioning within the vitreous was verified externally with the light pipe.  The infusion was then connected to the cannula  and BSS infusion was commenced.  Additional ports were placed in the superonasal and superotemporal quadrants. Viscoat was placed on the cornea. First, the anterior hyaloid was removed using the vitrectomy cutter under direct visualization. Next, the BIOM was used to visualize the posterior segment while the core vitrectomy was completed.  The patient had a visible full thickness macular hole and a posterior vitreous detachment was induced using suction over the optic nerve head and lifting anteriorly. The remaining vitreous was removed. Kenalog was used to aid in this process. A thorough peripheral vitrectomy was performed.      TissueBlue was then used to stain the internal limiting membrane. A macular contact lens was placed on the eye. End-grasping ILM forceps were used to create an opening in the ILM and the ILM was peeled fully from the macula taking care to avoid traction on the macular hole. There were no complications.   The wide angle viewing system was brought back into position. Scleral depression was performed and used to meticulously shave the thick and adherent vitreous base and to inspect the peripheral retina. On 360 inspection, there were focal patches of lattice degeneration inferiorly and superiorly. Of note, a patch of lattice at 0600 had a retinal break and the endolaser was used to place 3 confluent rows of laser around the lattice and 360 degrees around the peripheral retina, just posterior to the ora serrata. An air fluid exchange was then performed.    The superotemporal port was then removed and sutured with 7-0 vicryl, there was no leakage. 14% C3F8 gas was connected to  the infusion line and gas was injected into the posterior segment while venting air through the superonasal trocar using the extrusion cannula. Once a full, 40cc of gas was vented through the eye, the infusion port and venting ports were removed and they were sutured with 7-0 vicryl.  There was no leakage from the  sclerotomy sites.   Subconjunctival injections of kefzol + bacitracin + polymixin b and kenalog were then administered, and antibiotic and steroid drops as well as antibiotic ointment were placed in the eye.  The drapes were removed and the eye was patched and shielded.  A green gas bracelet was placed on the patient's left wrist. The patient was then taken to the post-operative area for recovery having tolerated the procedure well.  She was instructed to perform face down positioning postoperatively and to follow up in clinic the following morning as scheduled.   Estimate blood lost: none Complications: None

## 2022-05-25 NOTE — Interval H&P Note (Signed)
History and Physical Interval Note:  05/25/2022 11:20 AM  Anthony Hebert  has presented today for surgery, with the diagnosis of left eye, full thickness macular hole.  The various methods of treatment have been discussed with the patient and family. After consideration of risks, benefits and other options for treatment, the patient has consented to  Procedure(s): 25 GAUGE PARS PLANA VITRECTOMY WITH 20 GAUGE MVR PORT FOR MACULAR HOLE (Left) as a surgical intervention.  The patient's history has been reviewed, patient examined, no change in status, stable for surgery.  I have reviewed the patient's chart and labs.  Questions were answered to the patient's satisfaction.     Bernarda Caffey

## 2022-05-25 NOTE — Transfer of Care (Signed)
Immediate Anesthesia Transfer of Care Note  Patient: Anthony Hebert  Procedure(s) Performed: 25 GAUGE PARS PLANA VITRECTOMY WITH 20 GAUGE MVR PORT FOR MACULAR HOLE (Left: Eye) INSERTION OF GAS (Left: Eye) MEMBRANE PEEL (Left: Eye)  Patient Location: PACU  Anesthesia Type:General  Level of Consciousness: awake, alert  and oriented  Airway & Oxygen Therapy: Patient Spontanous Breathing and Patient connected to face mask oxygen  Post-op Assessment: Report given to RN and Post -op Vital signs reviewed and stable  Post vital signs: Reviewed and stable  Last Vitals:  Vitals Value Taken Time  BP 121/81 05/25/22 1400  Temp    Pulse 87 05/25/22 1401  Resp 15 05/25/22 1401  SpO2 97 % 05/25/22 1401  Vitals shown include unvalidated device data.  Last Pain:  Vitals:   05/25/22 1021  TempSrc:   PainSc: 4       Patients Stated Pain Goal: 2 (40/76/80 8811)  Complications:  Encounter Notable Events  Notable Event Outcome Phase Comment  Difficult to intubate - expected  Intraprocedure Filed from anesthesia note documentation.

## 2022-05-25 NOTE — Discharge Instructions (Signed)
POSTOPERATIVE INSTRUCTIONS  Your doctor has performed vitreoretinal surgery on you at [facility]. [place] eye patched and shielded until seen by Dr. Coralyn Pear 8 AM tomorrow in clinic - Do not use drops until return - [place] - Sleep with belly down or on right side, avoid laying flat on back.    - No strenuous bending, stooping or lifting.  - You may not drive until further notice.  - If your doctor used a gas bubble in your eye during the procedure he will advise you on postoperative positioning. If you have a gas bubble you will be wearing a green bracelet that was applied in the operating room. The green bracelet should stay on as long as the gas bubble is in your eye. While the gas bubble is present you should not fly in an airplane. If you require general anesthesia while the gas bubble is present you must notify your anesthesiologist that an intraocular gas bubble is present so he can take the appropriate precautions.  - Tylenol or any other over-the-counter pain reliever can be used according to your doctor. If more pain medicine is required, your doctor will have a prescription for you.  - You may read, go up and down stairs, and watch television.     Bernarda Caffey, M.D., Ph.D.

## 2022-05-25 NOTE — Anesthesia Procedure Notes (Signed)
Procedure Name: Intubation Date/Time: 05/25/2022 11:49 AM  Performed by: Genelle Bal, CRNAPre-anesthesia Checklist: Patient identified, Emergency Drugs available, Suction available and Patient being monitored Patient Re-evaluated:Patient Re-evaluated prior to induction Oxygen Delivery Method: Circle system utilized Preoxygenation: Pre-oxygenation with 100% oxygen Induction Type: IV induction Ventilation: Mask ventilation without difficulty Laryngoscope Size: Glidescope and 4 Grade View: Grade I Tube type: Oral Tube size: 7.5 mm Number of attempts: 1 Airway Equipment and Method: Stylet and Video-laryngoscopy Placement Confirmation: ETT inserted through vocal cords under direct vision, positive ETCO2 and breath sounds checked- equal and bilateral Secured at: 23 cm Tube secured with: Tape Dental Injury: Teeth and Oropharynx as per pre-operative assessment  Difficulty Due To: Difficulty was anticipated and Difficult Airway- due to limited oral opening Comments: Elective glidescope intubation d/t prior intubation notes & limited mouth opening.

## 2022-05-25 NOTE — Brief Op Note (Signed)
05/25/2022  2:04 PM  PATIENT:  Anthony Hebert  71 y.o. male  PRE-OPERATIVE DIAGNOSIS:  left eye, full thickness macular hole  POST-OPERATIVE DIAGNOSIS:  left eye, full thickness macular hole  PROCEDURE:  Procedure(s): 25 GAUGE PARS PLANA VITRECTOMY WITH 20 GAUGE MVR PORT FOR MACULAR HOLE (Left) INSERTION OF GAS (Left) MEMBRANE PEEL (Left)  SURGEON:  Surgeon(s) and Role:    Bernarda Caffey, MD - Primary  ASSISTANTS: Ernest Mallick, Ophthalmic Assistant    ANESTHESIA:   general  EBL:  minimal   BLOOD ADMINISTERED:none  DRAINS: none   LOCAL MEDICATIONS USED:  NONE  SPECIMEN:  No Specimen  DISPOSITION OF SPECIMEN:  N/A  COUNTS:  YES  TOURNIQUET:  * No tourniquets in log *  DICTATION: .Note written in EPIC  PLAN OF CARE: Discharge to home after PACU  PATIENT DISPOSITION:  PACU - hemodynamically stable.   Delay start of Pharmacological VTE agent (>24hrs) due to surgical blood loss or risk of bleeding: not applicable

## 2022-05-26 ENCOUNTER — Encounter (INDEPENDENT_AMBULATORY_CARE_PROVIDER_SITE_OTHER): Payer: Self-pay | Admitting: Ophthalmology

## 2022-05-26 ENCOUNTER — Ambulatory Visit (INDEPENDENT_AMBULATORY_CARE_PROVIDER_SITE_OTHER): Payer: Medicare HMO | Admitting: Ophthalmology

## 2022-05-26 DIAGNOSIS — Z961 Presence of intraocular lens: Secondary | ICD-10-CM

## 2022-05-26 DIAGNOSIS — I10 Essential (primary) hypertension: Secondary | ICD-10-CM

## 2022-05-26 DIAGNOSIS — H35033 Hypertensive retinopathy, bilateral: Secondary | ICD-10-CM

## 2022-05-26 DIAGNOSIS — H35342 Macular cyst, hole, or pseudohole, left eye: Secondary | ICD-10-CM

## 2022-05-26 NOTE — Anesthesia Postprocedure Evaluation (Signed)
Anesthesia Post Note  Patient: Anthony Hebert  Procedure(s) Performed: 25 GAUGE PARS PLANA VITRECTOMY WITH 20 GAUGE MVR PORT FOR MACULAR HOLE (Left: Eye) INSERTION OF GAS (Left: Eye) MEMBRANE PEEL (Left: Eye)     Patient location during evaluation: PACU Anesthesia Type: General Level of consciousness: awake and alert Pain management: pain level controlled Vital Signs Assessment: post-procedure vital signs reviewed and stable Respiratory status: spontaneous breathing, nonlabored ventilation and respiratory function stable Cardiovascular status: blood pressure returned to baseline Postop Assessment: no apparent nausea or vomiting Anesthetic complications: yes   Encounter Notable Events  Notable Event Outcome Phase Comment  Difficult to intubate - expected  Intraprocedure Filed from anesthesia note documentation.    Last Vitals:  Vitals:   05/25/22 1430 05/25/22 1445  BP: 120/60 118/67  Pulse: 86 88  Resp: 16 14  Temp:  36.8 C  SpO2: 90% 90%    Last Pain:  Vitals:   05/25/22 1445  TempSrc:   PainSc: 1    Pain Goal: Patients Stated Pain Goal: 2 (05/25/22 1021)                 Marthenia Rolling

## 2022-05-29 NOTE — Progress Notes (Shared)
[facility] Note  06/01/2022     CHIEF COMPLAINT Patient presents for Post-op Follow-up   HISTORY OF PRESENT ILLNESS: Anthony Hebert is a 71 y.o. male who presents to the clinic today for:   HPI     Post-op Follow-up   In left eye.  Vision is improved and is blurred at distance.  I, the attending physician,  performed the HPI with the patient and updated documentation appropriately.        Comments   Pt is s/p FTMH PPV MP and GAS pt is reporting he has noticed some slight improvement over the last week he is still not seeing well denies pain or discharge       Last edited by Bernarda Caffey, MD on 06/01/2022 12:12 PM.    Pt states hes doing well w/ staying in position at home.   Referring physician: Aura Dials, MD [place],  [place] [address]  HISTORICAL INFORMATION:   Selected notes from the MEDICAL RECORD NUMBER Referred by Dr. Lucita Ferrara for mac hole OS LEE:  Ocular Hx- PMH-    CURRENT MEDICATIONS: Current Outpatient Medications (Ophthalmic Drugs)  Medication Sig   bacitracin-polymyxin b (POLYSPORIN) ophthalmic ointment Place 1 Application into the left eye at bedtime. Apply 1/2inch ribbon to left eye and bedtime and as needed.   prednisoLONE acetate (PRED FORTE) 1 % ophthalmic suspension Place 1 drop into the left eye 4 (four) times daily.   Difluprednate 0.05 % EMUL Place 1 drop into the left eye 4 (four) times daily.   Polyvinyl Alcohol-Povidone (REFRESH OP) Place 1 drop into both eyes daily as needed (dry eyes).   PROLENSA 0.07 % SOLN Place 1 drop into the left eye 2 (two) times daily.   RESTASIS 0.05 % ophthalmic emulsion Place 1 drop into both eyes 2 (two) times daily.   No current facility-administered medications for this visit. (Ophthalmic Drugs)   Current Outpatient Medications (Other)  Medication Sig   allopurinol (ZYLOPRIM) 100 MG tablet Take 200 mg by mouth daily.   bismuth subsalicylate (PEPTO  BISMOL) 262 MG chewable tablet Chew 262 mg by mouth as needed for indigestion.   gabapentin (NEURONTIN) 100 MG capsule Take 100 mg by mouth 3 (three) times daily.   lisinopril (ZESTRIL) 20 MG tablet Take 20 mg by mouth daily.   methocarbamol (ROBAXIN) 500 MG tablet Take 1 tablet (500 mg total) by mouth every 6 (six) hours as needed for muscle spasms.   oxyCODONE (ROXICODONE) 5 MG immediate release tablet Take 1-2 tablets (5-10 mg total) by mouth every 6 (six) hours as needed for severe pain.   rosuvastatin (CRESTOR) 20 MG tablet Take 1 tablet (20 mg total) by mouth daily.   traMADol (ULTRAM) 50 MG tablet Take 1-2 tablets (50-100 mg total) by mouth every 6 (six) hours as needed for moderate pain.   No current facility-administered medications for this visit. (Other)   REVIEW OF SYSTEMS:   ALLERGIES No Known Allergies  PAST MEDICAL HISTORY Past Medical History:  Diagnosis Date   Acute idiopathic gout of right ankle    Aortic valve sclerosis    Arthritis    "all over"   Chronic back pain    "all over"   Complication of anesthesia    slow to wake up at times   DDD (degenerative disc disease), lumbar    Difficult intubation    had trouble with intubation shape of throat;last sx no problems per  pt; 03/28/16 successful oral ETT with green Glidescope, stylet. Has limited oral opening/small glottic opening   Foot swelling    in [place] 07/2013   GERD (gastroesophageal reflux disease)    occ   Gout of left foot 10/2011   in [place]   Heart murmur    states no problems   History of melanoma    Hypercholesterolemia    Hyperlipidemia    Hypertension    Melanoma ([place]) ~ 2010   "above left eye"   Nonrheumatic aortic valve stenosis    Other chronic pain    Primary osteoarthritis involving multiple joints    Rising PSA level    Screening for prostate cancer    Stroke [facility])    "maybe w/my 1st back OR in the 1990's"; denies residual on 07/15/2015   Use of cane as ambulatory aid    straight    Walker as ambulation aid    Past Surgical History:  Procedure Laterality Date   25 GAUGE PARS PLANA VITRECTOMY WITH 20 GAUGE MVR PORT FOR MACULAR HOLE Left 05/25/2022   Procedure: 25 GAUGE PARS PLANA VITRECTOMY WITH 20 GAUGE MVR PORT FOR MACULAR HOLE;  Surgeon: Bernarda Caffey, MD;  Location: [place] [facility];  Service: Ophthalmology;  Laterality: Left;   APPENDECTOMY     BACK SURGERY     CARPAL TUNNEL RELEASE Right 07/15/2015   w/GUYON'S CANAL RELEASE    CARPAL TUNNEL RELEASE Right 07/15/2015   Procedure: RIGHT FOREARM AND WRIST CARPAL TUNNEL RELEASE;  Surgeon: Roseanne Kaufman, MD;  Location: [place];  Service: Orthopedics;  Laterality: Right;   CARPAL TUNNEL RELEASE Left 03/22/2017   Procedure: OPEN CARPAL TUNNEL RELEASE;  Surgeon: Roseanne Kaufman, MD;  Location: [facility];  Service: Orthopedics;  Laterality: Left;  60 mins   COLONOSCOPY     COLONOSCOPY WITH PROPOFOL N/A 01/05/2022   Procedure: COLONOSCOPY WITH PROPOFOL;  Surgeon: Milus Banister, MD;  Location: [facility];  Service: Gastroenterology;  Laterality: N/A;   FOOT SURGERY Right    cut nerve to relieve pain   GAS INSERTION Left 05/25/2022   Procedure: INSERTION OF GAS;  Surgeon: Bernarda Caffey, MD;  Location: [facility];  Service: Ophthalmology;  Laterality: Left;   HEMOSTASIS CLIP PLACEMENT  01/05/2022   Procedure: HEMOSTASIS CLIP PLACEMENT;  Surgeon: Milus Banister, MD;  Location: [facility];  Service: Gastroenterology;;   INGUINAL HERNIA REPAIR Left [place] N/A 03/28/2016   Procedure: INSERTION OF MESH;  Surgeon: Rolm Bookbinder, MD;  Location: [facility];  Service: General;  Laterality: N/A;   MELANOMA EXCISION     MEMBRANE PEEL Left 05/25/2022   Procedure: MEMBRANE PEEL;  Surgeon: Bernarda Caffey, MD;  Location: [facility];  Service: Ophthalmology;  Laterality: Left;   POLYPECTOMY  01/05/2022   Procedure: POLYPECTOMY;  Surgeon: Milus Banister, MD;  Location: [facility];  Service: Gastroenterology;;   POSTERIOR CERVICAL LAMINECTOMY  Right 10/07/2013   Procedure: POSTERIOR CERVICAL LAMINECTOMY RIGHT CERVICAL SEVEN -THORACIC ONE FORAMINOTOMY;  Surgeon: Faythe Ghee, MD;  Location: [facility];  Service: Neurosurgery;  Laterality: Right;   POSTERIOR LUMBAR FUSION  X 7   SPINAL CORD STIMULATOR IMPLANT  ~ 2012   SPINAL CORD STIMULATOR REMOVAL N/A 09/19/2012   Procedure: LUMBAR SPINAL CORD STIMULATOR REMOVAL;  Surgeon: Faythe Ghee, MD;  Location: [facility];  Service: Neurosurgery;  Laterality: N/A;   TONSILLECTOMY     TOTAL HIP ARTHROPLASTY Right 05/01/2022   Procedure: TOTAL HIP ARTHROPLASTY ANTERIOR APPROACH;  Surgeon: Gaynelle Arabian, MD;  Location: [facility];  Service: Orthopedics;  Laterality: Right;   ULNAR NERVE TRANSPOSITION Right 07/15/2015   supercharged AIN to deep motor branch ulnar nerve nerve transfer   UMBILICAL HERNIA REPAIR N/A 03/28/2016   Procedure: LAPAROSCOPIC UMBILICAL HERNIA;  Surgeon: Rolm Bookbinder, MD;  Location: [facility];  Service: General;  Laterality: N/A;   FAMILY HISTORY Family History  Problem Relation Age of Onset   Cancer Mother    Alzheimer's disease Father    Colon cancer Neg Hx    Colon polyps Neg Hx    Esophageal cancer Neg Hx    Stomach cancer Neg Hx    Rectal cancer Neg Hx    SOCIAL HISTORY Social History   Tobacco Use   Smoking status: Never   Smokeless tobacco: Never  Vaping Use   Vaping Use: Never used  Substance Use Topics   Alcohol use: Yes    Alcohol/week: 1.0 - 2.0 standard drink of alcohol    Types: 1 - 2 Cans of beer per week    Comment: socially   Drug use: No       OPHTHALMIC EXAM:  Base Eye Exam     Visual Acuity (Snellen - Linear)       Right Left   Dist [place]  HM   Dist ph [place]  NI         Tonometry (Tonopen, 9:00 AM)       Right Left   Pressure 16 13         Pupils       Dark Light Shape React APD   Right 3 2 Round Brisk None   Left dilated             Visual Fields       Left Right     Full   Restrictions Total superior  temporal, inferior temporal, superior nasal, inferior nasal deficiencies          Extraocular Movement       Right Left    Full, Ortho Full, Ortho         Neuro/Psych     Oriented x3: Yes   Mood/Affect: Normal         Dilation   Pt used dilating gtts this am           Slit Lamp and Fundus Exam     Slit Lamp Exam       Right Left   Lids/Lashes Dermatochalasis - upper lid Dermatochalasis - upper lid   Conjunctiva/Sclera White and quiet nasal and temporal pinguecula, sutures intact, 1+ injection   Cornea mild arcus, tear film debris, well healed cataract wound arcus, trace PEE, well healed cataract wound   Anterior Chamber deep and clear deep, .5+cell + pigment   Iris Round and dilated Round and moderately dilated to 6.25   Lens PC IOL in good position PC IOL in good position   Anterior Vitreous mild syneresis post vitrectomy, good gas fill         Fundus Exam       Right Left   Disc mild Pallor, Sharp rim Pink and Sharp, focal PPP IT rim   C/D Ratio 0.2 0.3   Macula Flat, Good foveal reflex, RPE mottling, No heme or edema flat under gas, mac hole closing   Vessels mild attenuation, mild tortuosity mild attenuation, mild tortuosity   Periphery Attached Attached, good 360 peripheral laser changes  IMAGING AND PROCEDURES  Imaging and Procedures for 06/01/2022          ASSESSMENT/PLAN:    ICD-10-CM   1. Macular hole of left eye  H35.342 CANCELED: OCT, Retina - OU - Both Eyes    2. Essential hypertension  I10     3. Hypertensive retinopathy of both eyes  H35.033     4. Pseudophakia, both eyes  Z96.1      Macular hole, left eye   - pre op OCT shows stable, thin FTMH, mild interval improvement in surrounding cystic changes  - pre op BCVA 20/70 - POW1 s/p PPV/TissueBlue/MP/14% C3F8 OS, 10.12.23             - doing well             - retina attached and good gas bubble in place w/ mac hole closing             - IOP okay at 13              - decrease PF to 4x/day OS  - zymaxid QID OS, continue until Sunday 10.22.23 then d/c. - Atropine BID OS, continue until Sunday 10.22.23 then d/c.  - PSO ung QHS and PRN.              - cont face down positioning 30 min/hr; avoid laying flat on back              - eye shield when sleeping x1 more week             - post op drop and positioning instructions reviewed              - tylenol/ibuprofen for pain  - f/u 3-4 wks  2,3. Hypertensive retinopathy OU - discussed importance of tight BP control - monitor  4. Pseudophakia OU  - s/p CE/IOL OU (Dr. Talbert Forest)  - IOLs in good position, doing well  - monitor  Ophthalmic Meds Ordered this visit:  Meds ordered this encounter  Medications   prednisoLONE acetate (PRED FORTE) 1 % ophthalmic suspension    Sig: Place 1 drop into the left eye 4 (four) times daily.    Dispense:  10 mL    Refill:  1   bacitracin-polymyxin b (POLYSPORIN) ophthalmic ointment    Sig: Place 1 Application into the left eye at bedtime. Apply 1/2inch ribbon to left eye and bedtime and as needed.    Dispense:  3.5 g    Refill:  1     Return for 3-4 wks, POV - mac hole OS.  There are no Patient Instructions on file for this visit.   Explained the diagnoses, plan, and follow up with the patient and they expressed understanding.  Patient expressed understanding of the importance of proper follow up care.   This document serves as a record of services personally performed by Gardiner Sleeper, MD, PhD. It was created on their behalf by San Jetty. Owens Shark, OA an ophthalmic technician. The creation of this record is the provider's dictation and/or activities during the visit.    Electronically signed by: San Jetty. Owens Shark, [place] 10.16.2023 12:17 PM  This document serves as a record of services personally performed by Gardiner Sleeper, MD, PhD. It was created on their behalf by Orvan Falconer, an ophthalmic technician. The creation of this record is the provider's dictation  and/or activities during the visit.    Electronically signed by: Orvan Falconer, OA, 06/01/22  12:17  PM  Gardiner Sleeper, M.D., Ph.D. Diseases & Surgery of the Retina and Vitreous [facility]  I have reviewed the above documentation for accuracy and completeness, and I agree with the above. Gardiner Sleeper, M.D., Ph.D. 06/01/22 12:17 PM   Abbreviations: M myopia (nearsighted); A astigmatism; H hyperopia (farsighted); P presbyopia; Mrx spectacle prescription;  CTL contact lenses; OD right eye; OS left eye; OU both eyes  XT exotropia; ET esotropia; PEK punctate epithelial keratitis; PEE punctate epithelial erosions; DES dry eye syndrome; MGD meibomian gland dysfunction; ATs artificial tears; PFAT's preservative free artificial tears; [place] nuclear sclerotic cataract; PSC posterior subcapsular cataract; ERM epi-retinal membrane; PVD posterior vitreous detachment; RD retinal detachment; DM diabetes mellitus; DR diabetic retinopathy; NPDR non-proliferative diabetic retinopathy; PDR proliferative diabetic retinopathy; CSME clinically significant macular edema; DME diabetic macular edema; dbh dot blot hemorrhages; CWS cotton wool spot; POAG primary open angle glaucoma; C/D cup-to-disc ratio; HVF humphrey visual field; GVF goldmann visual field; OCT optical coherence tomography; IOP intraocular pressure; BRVO Branch retinal vein occlusion; CRVO central retinal vein occlusion; CRAO central retinal artery occlusion; BRAO branch retinal artery occlusion; RT retinal tear; SB scleral buckle; PPV pars plana vitrectomy; VH Vitreous hemorrhage; PRP panretinal laser photocoagulation; IVK intravitreal kenalog; VMT vitreomacular traction; MH Macular hole;  NVD neovascularization of the disc; NVE neovascularization elsewhere; AREDS age related eye disease study; ARMD age related macular degeneration; POAG primary open angle glaucoma; EBMD epithelial/anterior basement membrane dystrophy; ACIOL anterior  chamber intraocular lens; IOL intraocular lens; PCIOL posterior chamber intraocular lens; Phaco/IOL phacoemulsification with intraocular lens placement; [place] photorefractive keratectomy; LASIK laser assisted in situ keratomileusis; HTN hypertension; DM diabetes mellitus; COPD chronic obstructive pulmonary disease

## 2022-06-01 ENCOUNTER — Encounter (INDEPENDENT_AMBULATORY_CARE_PROVIDER_SITE_OTHER): Payer: Self-pay | Admitting: Ophthalmology

## 2022-06-01 ENCOUNTER — Ambulatory Visit (INDEPENDENT_AMBULATORY_CARE_PROVIDER_SITE_OTHER): Payer: Medicare HMO | Admitting: Ophthalmology

## 2022-06-01 DIAGNOSIS — I10 Essential (primary) hypertension: Secondary | ICD-10-CM

## 2022-06-01 DIAGNOSIS — H35342 Macular cyst, hole, or pseudohole, left eye: Secondary | ICD-10-CM

## 2022-06-01 DIAGNOSIS — Z961 Presence of intraocular lens: Secondary | ICD-10-CM

## 2022-06-01 DIAGNOSIS — H35033 Hypertensive retinopathy, bilateral: Secondary | ICD-10-CM

## 2022-06-01 MED ORDER — PREDNISOLONE ACETATE 1 % OP SUSP: 1.0000 [drp] | Freq: Four times a day (QID) | OPHTHALMIC | 1 refills | Status: DC

## 2022-06-01 MED ORDER — BACITRACIN-POLYMYXIN B 500-10000 UNIT/GM OP OINT: 1.0000 | TOPICAL_OINTMENT | Freq: Every day | OPHTHALMIC | 1 refills | Status: DC

## 2022-06-02 DIAGNOSIS — R600 Localized edema: Secondary | ICD-10-CM | POA: Diagnosis not present

## 2022-06-02 DIAGNOSIS — R609 Edema, unspecified: Secondary | ICD-10-CM | POA: Diagnosis not present

## 2022-06-06 ENCOUNTER — Other Ambulatory Visit: Payer: Self-pay | Admitting: Interventional Cardiology

## 2022-06-06 ENCOUNTER — Other Ambulatory Visit (INDEPENDENT_AMBULATORY_CARE_PROVIDER_SITE_OTHER): Payer: Self-pay

## 2022-06-06 DIAGNOSIS — H35033 Hypertensive retinopathy, bilateral: Secondary | ICD-10-CM

## 2022-06-06 DIAGNOSIS — Z961 Presence of intraocular lens: Secondary | ICD-10-CM

## 2022-06-06 DIAGNOSIS — I10 Essential (primary) hypertension: Secondary | ICD-10-CM

## 2022-06-06 DIAGNOSIS — H35342 Macular cyst, hole, or pseudohole, left eye: Secondary | ICD-10-CM

## 2022-06-06 NOTE — Progress Notes (Signed)
[facility] Note  06/06/2022     CHIEF COMPLAINT Patient presents for No chief complaint on file.   HISTORY OF PRESENT ILLNESS: Anthony Hebert is a 71 y.o. male who presents to the clinic today for:    Pt states hes doing well w/ staying in position at home.   Referring physician: No referring provider defined for this encounter.  HISTORICAL INFORMATION:   Selected notes from the MEDICAL RECORD NUMBER Referred by Dr. Lucita Ferrara for mac hole OS LEE:  Ocular Hx- PMH-    CURRENT MEDICATIONS: Current Outpatient Medications (Ophthalmic Drugs)  Medication Sig   bacitracin-polymyxin b (POLYSPORIN) ophthalmic ointment Place 1 Application into the left eye at bedtime. Apply 1/2inch ribbon to left eye and bedtime and as needed.   Difluprednate 0.05 % EMUL Place 1 drop into the left eye 4 (four) times daily.   Polyvinyl Alcohol-Povidone (REFRESH OP) Place 1 drop into both eyes daily as needed (dry eyes).   prednisoLONE acetate (PRED FORTE) 1 % ophthalmic suspension Place 1 drop into the left eye 4 (four) times daily.   PROLENSA 0.07 % SOLN Place 1 drop into the left eye 2 (two) times daily.   RESTASIS 0.05 % ophthalmic emulsion Place 1 drop into both eyes 2 (two) times daily.   No current facility-administered medications for this visit. (Ophthalmic Drugs)   Current Outpatient Medications (Other)  Medication Sig   allopurinol (ZYLOPRIM) 100 MG tablet Take 200 mg by mouth daily.   bismuth subsalicylate (PEPTO BISMOL) 262 MG chewable tablet Chew 262 mg by mouth as needed for indigestion.   gabapentin (NEURONTIN) 100 MG capsule Take 100 mg by mouth 3 (three) times daily.   lisinopril (ZESTRIL) 20 MG tablet Take 20 mg by mouth daily.   methocarbamol (ROBAXIN) 500 MG tablet Take 1 tablet (500 mg total) by mouth every 6 (six) hours as needed for muscle spasms.   oxyCODONE (ROXICODONE) 5 MG immediate release tablet Take 1-2 tablets (5-10 mg total) by mouth  every 6 (six) hours as needed for severe pain.   rosuvastatin (CRESTOR) 20 MG tablet Take 1 tablet (20 mg total) by mouth daily.   traMADol (ULTRAM) 50 MG tablet Take 1-2 tablets (50-100 mg total) by mouth every 6 (six) hours as needed for moderate pain.   No current facility-administered medications for this visit. (Other)   REVIEW OF SYSTEMS:   ALLERGIES No Known Allergies  PAST MEDICAL HISTORY Past Medical History:  Diagnosis Date   Acute idiopathic gout of right ankle    Aortic valve sclerosis    Arthritis    "all over"   Chronic back pain    "all over"   Complication of anesthesia    slow to wake up at times   DDD (degenerative disc disease), lumbar    Difficult intubation    had trouble with intubation shape of throat;last sx no problems per pt; 03/28/16 successful oral ETT with green Glidescope, stylet. Has limited oral opening/small glottic opening   Foot swelling    in [place] 07/2013   GERD (gastroesophageal reflux disease)    occ   Gout of left foot 10/2011   in [place]   Heart murmur    states no problems   History of melanoma    Hypercholesterolemia    Hyperlipidemia    Hypertension    Melanoma ([place]) ~ 2010   "above left eye"   Nonrheumatic aortic valve stenosis    Other chronic pain  Primary osteoarthritis involving multiple joints    Rising PSA level    Screening for prostate cancer    Stroke [facility])    "maybe w/my 1st back OR in the 1990's"; denies residual on 07/15/2015   Use of cane as ambulatory aid    straight   Walker as ambulation aid    Past Surgical History:  Procedure Laterality Date   25 GAUGE PARS PLANA VITRECTOMY WITH 20 GAUGE MVR PORT FOR MACULAR HOLE Left 05/25/2022   Procedure: 25 GAUGE PARS PLANA VITRECTOMY WITH 20 GAUGE MVR PORT FOR MACULAR HOLE;  Surgeon: Bernarda Caffey, MD;  Location: [place];  Service: Ophthalmology;  Laterality: Left;   APPENDECTOMY     BACK SURGERY     CARPAL TUNNEL RELEASE Right 07/15/2015   w/GUYON'S CANAL RELEASE     CARPAL TUNNEL RELEASE Right 07/15/2015   Procedure: RIGHT FOREARM AND WRIST CARPAL TUNNEL RELEASE;  Surgeon: Roseanne Kaufman, MD;  Location: [place];  Service: Orthopedics;  Laterality: Right;   CARPAL TUNNEL RELEASE Left 03/22/2017   Procedure: OPEN CARPAL TUNNEL RELEASE;  Surgeon: Roseanne Kaufman, MD;  Location: [place];  Service: Orthopedics;  Laterality: Left;  60 mins   COLONOSCOPY     COLONOSCOPY WITH PROPOFOL N/A 01/05/2022   Procedure: COLONOSCOPY WITH PROPOFOL;  Surgeon: Milus Banister, MD;  Location: [facility];  Service: Gastroenterology;  Laterality: N/A;   FOOT SURGERY Right    cut nerve to relieve pain   GAS INSERTION Left 05/25/2022   Procedure: INSERTION OF GAS;  Surgeon: Bernarda Caffey, MD;  Location: [place];  Service: Ophthalmology;  Laterality: Left;   HEMOSTASIS CLIP PLACEMENT  01/05/2022   Procedure: HEMOSTASIS CLIP PLACEMENT;  Surgeon: Milus Banister, MD;  Location: [facility];  Service: Gastroenterology;;   INGUINAL HERNIA REPAIR Left [place] N/A 03/28/2016   Procedure: INSERTION OF MESH;  Surgeon: Rolm Bookbinder, MD;  Location: [place];  Service: General;  Laterality: N/A;   MELANOMA EXCISION     MEMBRANE PEEL Left 05/25/2022   Procedure: MEMBRANE PEEL;  Surgeon: Bernarda Caffey, MD;  Location: [place];  Service: Ophthalmology;  Laterality: Left;   POLYPECTOMY  01/05/2022   Procedure: POLYPECTOMY;  Surgeon: Milus Banister, MD;  Location: [facility];  Service: Gastroenterology;;   POSTERIOR CERVICAL LAMINECTOMY Right 10/07/2013   Procedure: POSTERIOR CERVICAL LAMINECTOMY RIGHT CERVICAL SEVEN -THORACIC ONE FORAMINOTOMY;  Surgeon: Faythe Ghee, MD;  Location: [place] [facility];  Service: Neurosurgery;  Laterality: Right;   POSTERIOR LUMBAR FUSION  X 7   SPINAL CORD STIMULATOR IMPLANT  ~ 2012   SPINAL CORD STIMULATOR REMOVAL N/A 09/19/2012   Procedure: LUMBAR SPINAL CORD STIMULATOR REMOVAL;  Surgeon: Faythe Ghee, MD;  Location: [place] [facility];  Service:  Neurosurgery;  Laterality: N/A;   TONSILLECTOMY     TOTAL HIP ARTHROPLASTY Right 05/01/2022   Procedure: TOTAL HIP ARTHROPLASTY ANTERIOR APPROACH;  Surgeon: Gaynelle Arabian, MD;  Location: [facility];  Service: Orthopedics;  Laterality: Right;   ULNAR NERVE TRANSPOSITION Right 07/15/2015   supercharged AIN to deep motor branch ulnar nerve nerve transfer   UMBILICAL HERNIA REPAIR N/A 03/28/2016   Procedure: LAPAROSCOPIC UMBILICAL HERNIA;  Surgeon: Rolm Bookbinder, MD;  Location: [facility] OR;  Service: General;  Laterality: N/A;   FAMILY HISTORY Family History  Problem Relation Age of Onset   Cancer Mother    Alzheimer's disease Father    Colon cancer Neg Hx    Colon polyps Neg Hx  Esophageal cancer Neg Hx    Stomach cancer Neg Hx    Rectal cancer Neg Hx    SOCIAL HISTORY Social History   Tobacco Use   Smoking status: Never   Smokeless tobacco: Never  Vaping Use   Vaping Use: Never used  Substance Use Topics   Alcohol use: Yes    Alcohol/week: 1.0 - 2.0 standard drink of alcohol    Types: 1 - 2 Cans of beer per week    Comment: socially   Drug use: No       OPHTHALMIC EXAM:  Not recorded    IMAGING AND PROCEDURES  Imaging and Procedures for 06/06/2022          ASSESSMENT/PLAN:    ICD-10-CM   1. Macular hole of left eye  H35.342     2. Essential hypertension  I10     3. Hypertensive retinopathy of both eyes  H35.033     4. Pseudophakia, both eyes  Z96.1      Macular hole, left eye   - pre op OCT shows stable, thin FTMH, mild interval improvement in surrounding cystic changes  - pre op BCVA 20/70 - POW1 s/p PPV/TissueBlue/MP/14% C3F8 OS, 10.12.23             - doing well             - retina attached and good gas bubble in place w/ mac hole closing             - IOP okay at 13             - decrease PF to 4x/day OS  - zymaxid QID OS, continue until Sunday 10.22.23 then d/c. - Atropine BID OS, continue until Sunday 10.22.23 then d/c.  - PSO ung QHS and PRN.               - cont face down positioning 30 min/hr; avoid laying flat on back              - eye shield when sleeping x1 more week             - post op drop and positioning instructions reviewed              - tylenol/ibuprofen for pain  - f/u 3-4 wks  2,3. Hypertensive retinopathy OU - discussed importance of tight BP control - monitor  4. Pseudophakia OU  - s/p CE/IOL OU (Dr. Talbert Forest)  - IOLs in good position, doing well  - monitor  Ophthalmic Meds Ordered this visit:  No orders of the defined types were placed in this encounter.    No follow-ups on file.  There are no Patient Instructions on file for this visit.   Explained the diagnoses, plan, and follow up with the patient and they expressed understanding.  Patient expressed understanding of the importance of proper follow up care.   This document serves as a record of services personally performed by Gardiner Sleeper, MD, PhD. It was created on their behalf by San Jetty. Owens Shark, OA an ophthalmic technician. The creation of this record is the provider's dictation and/or activities during the visit.    Electronically signed by: San Jetty. Owens Shark, [place] 10.24.2023 2:39 PM   Gardiner Sleeper, M.D., Ph.D. Diseases & Surgery of the Retina and Vitreous [facility]: M myopia (nearsighted); A astigmatism; H hyperopia (farsighted); P presbyopia; Mrx spectacle prescription;  CTL contact  lenses; OD right eye; OS left eye; OU both eyes  XT exotropia; ET esotropia; PEK punctate epithelial keratitis; PEE punctate epithelial erosions; DES dry eye syndrome; MGD meibomian gland dysfunction; ATs artificial tears; PFAT's preservative free artificial tears; [place] nuclear sclerotic cataract; PSC posterior subcapsular cataract; ERM epi-retinal membrane; PVD posterior vitreous detachment; RD retinal detachment; DM diabetes mellitus; DR diabetic retinopathy; NPDR non-proliferative diabetic retinopathy; PDR  proliferative diabetic retinopathy; CSME clinically significant macular edema; DME diabetic macular edema; dbh dot blot hemorrhages; CWS cotton wool spot; POAG primary open angle glaucoma; C/D cup-to-disc ratio; HVF humphrey visual field; GVF goldmann visual field; OCT optical coherence tomography; IOP intraocular pressure; BRVO Branch retinal vein occlusion; CRVO central retinal vein occlusion; CRAO central retinal artery occlusion; BRAO branch retinal artery occlusion; RT retinal tear; SB scleral buckle; PPV pars plana vitrectomy; VH Vitreous hemorrhage; PRP panretinal laser photocoagulation; IVK intravitreal kenalog; VMT vitreomacular traction; MH Macular hole;  NVD neovascularization of the disc; NVE neovascularization elsewhere; AREDS age related eye disease study; ARMD age related macular degeneration; POAG primary open angle glaucoma; EBMD epithelial/anterior basement membrane dystrophy; ACIOL anterior chamber intraocular lens; IOL intraocular lens; PCIOL posterior chamber intraocular lens; Phaco/IOL phacoemulsification with intraocular lens placement; [place] photorefractive keratectomy; LASIK laser assisted in situ keratomileusis; HTN hypertension; DM diabetes mellitus; COPD chronic obstructive pulmonary disease

## 2022-06-09 NOTE — Progress Notes (Signed)
[facility] Note  06/22/2022     CHIEF COMPLAINT Patient presents for Retina Follow Up   HISTORY OF PRESENT ILLNESS: Anthony Hebert is a 71 y.o. male who presents to the clinic today for:   HPI     Retina Follow Up   Patient presents with  Other.  In left eye.  This started 3 weeks ago.  I, the attending physician,  performed the HPI with the patient and updated documentation appropriately.        Comments   Patient here for 3 weeks retina follow up for PPV + MP OS (05-25-22) Patient states vision has improved in OS. Sees everything inside bouncing around. Sees like black rim and waves like ocean. No eye pain. Still using ung QHS OS. And pink top QID OS.       Last edited by Bernarda Caffey, MD on 06/22/2022 10:36 AM.    Pt states he can see a black line in his vision from the gas bubble  Referring physician: Aura Dials, MD [place],  [place] [address]  HISTORICAL INFORMATION:   Selected notes from the MEDICAL RECORD NUMBER Referred by Dr. Lucita Ferrara for mac hole OS LEE:  Ocular Hx- PMH-    CURRENT MEDICATIONS: Current Outpatient Medications (Ophthalmic Drugs)  Medication Sig   bacitracin-polymyxin b (POLYSPORIN) ophthalmic ointment Place 1 Application into the left eye at bedtime. Apply 1/2inch ribbon to left eye and bedtime and as needed.   Polyvinyl Alcohol-Povidone (REFRESH OP) Place 1 drop into both eyes daily as needed (dry eyes).   RESTASIS 0.05 % ophthalmic emulsion Place 1 drop into both eyes 2 (two) times daily.   Difluprednate 0.05 % EMUL Place 1 drop into the left eye 4 (four) times daily.   prednisoLONE acetate (PRED FORTE) 1 % ophthalmic suspension Place 1 drop into the left eye 4 (four) times daily.   PROLENSA 0.07 % SOLN Place 1 drop into the left eye 2 (two) times daily.   No current facility-administered medications for this visit. (Ophthalmic Drugs)   Current Outpatient Medications (Other)  Medication  Sig   allopurinol (ZYLOPRIM) 100 MG tablet Take 200 mg by mouth daily.   bismuth subsalicylate (PEPTO BISMOL) 262 MG chewable tablet Chew 262 mg by mouth as needed for indigestion.   gabapentin (NEURONTIN) 100 MG capsule Take 100 mg by mouth 3 (three) times daily.   lisinopril (ZESTRIL) 20 MG tablet Take 20 mg by mouth daily.   methocarbamol (ROBAXIN) 500 MG tablet Take 1 tablet (500 mg total) by mouth every 6 (six) hours as needed for muscle spasms.   oxyCODONE (ROXICODONE) 5 MG immediate release tablet Take 1-2 tablets (5-10 mg total) by mouth every 6 (six) hours as needed for severe pain.   rosuvastatin (CRESTOR) 20 MG tablet TAKE 1 TABLET EVERY DAY   traMADol (ULTRAM) 50 MG tablet Take 1-2 tablets (50-100 mg total) by mouth every 6 (six) hours as needed for moderate pain.   No current facility-administered medications for this visit. (Other)   REVIEW OF SYSTEMS:  ALLERGIES No Known Allergies  PAST MEDICAL HISTORY Past Medical History:  Diagnosis Date   Acute idiopathic gout of right ankle    Aortic valve sclerosis    Arthritis    "all over"   Chronic back pain    "all over"   Complication of anesthesia    slow to wake up at times   DDD (degenerative disc disease), lumbar  Difficult intubation    had trouble with intubation shape of throat;last sx no problems per pt; 03/28/16 successful oral ETT with green Glidescope, stylet. Has limited oral opening/small glottic opening   Foot swelling    in [place] 07/2013   GERD (gastroesophageal reflux disease)    occ   Gout of left foot 10/2011   in [place]   Heart murmur    states no problems   History of melanoma    Hypercholesterolemia    Hyperlipidemia    Hypertension    Melanoma ([place]) ~ 2010   "above left eye"   Nonrheumatic aortic valve stenosis    Other chronic pain    Primary osteoarthritis involving multiple joints    Rising PSA level    Screening for prostate cancer    Stroke [facility])    "maybe w/my 1st back OR in the  1990's"; denies residual on 07/15/2015   Use of cane as ambulatory aid    straight   Walker as ambulation aid    Past Surgical History:  Procedure Laterality Date   25 GAUGE PARS PLANA VITRECTOMY WITH 20 GAUGE MVR PORT FOR MACULAR HOLE Left 05/25/2022   Procedure: 25 GAUGE PARS PLANA VITRECTOMY WITH 20 GAUGE MVR PORT FOR MACULAR HOLE;  Surgeon: Bernarda Caffey, MD;  Location: [facility];  Service: Ophthalmology;  Laterality: Left;   APPENDECTOMY     BACK SURGERY     CARPAL TUNNEL RELEASE Right 07/15/2015   w/GUYON'S CANAL RELEASE    CARPAL TUNNEL RELEASE Right 07/15/2015   Procedure: RIGHT FOREARM AND WRIST CARPAL TUNNEL RELEASE;  Surgeon: Roseanne Kaufman, MD;  Location: [place];  Service: Orthopedics;  Laterality: Right;   CARPAL TUNNEL RELEASE Left 03/22/2017   Procedure: OPEN CARPAL TUNNEL RELEASE;  Surgeon: Roseanne Kaufman, MD;  Location: [place];  Service: Orthopedics;  Laterality: Left;  60 mins   COLONOSCOPY     COLONOSCOPY WITH PROPOFOL N/A 01/05/2022   Procedure: COLONOSCOPY WITH PROPOFOL;  Surgeon: Milus Banister, MD;  Location: [facility];  Service: Gastroenterology;  Laterality: N/A;   FOOT SURGERY Right    cut nerve to relieve pain   GAS INSERTION Left 05/25/2022   Procedure: INSERTION OF GAS;  Surgeon: Bernarda Caffey, MD;  Location: [facility];  Service: Ophthalmology;  Laterality: Left;   HEMOSTASIS CLIP PLACEMENT  01/05/2022   Procedure: HEMOSTASIS CLIP PLACEMENT;  Surgeon: Milus Banister, MD;  Location: [facility];  Service: Gastroenterology;;   INGUINAL HERNIA REPAIR Left [place] N/A 03/28/2016   Procedure: INSERTION OF MESH;  Surgeon: Rolm Bookbinder, MD;  Location: [facility];  Service: General;  Laterality: N/A;   MELANOMA EXCISION     MEMBRANE PEEL Left 05/25/2022   Procedure: MEMBRANE PEEL;  Surgeon: Bernarda Caffey, MD;  Location: [facility];  Service: Ophthalmology;  Laterality: Left;   POLYPECTOMY  01/05/2022   Procedure: POLYPECTOMY;  Surgeon: Milus Banister, MD;   Location: [facility];  Service: Gastroenterology;;   POSTERIOR CERVICAL LAMINECTOMY Right 10/07/2013   Procedure: POSTERIOR CERVICAL LAMINECTOMY RIGHT CERVICAL SEVEN -THORACIC ONE FORAMINOTOMY;  Surgeon: Faythe Ghee, MD;  Location: [place] [facility];  Service: Neurosurgery;  Laterality: Right;   POSTERIOR LUMBAR FUSION  X 7   SPINAL CORD STIMULATOR IMPLANT  ~ 2012   SPINAL CORD STIMULATOR REMOVAL N/A 09/19/2012   Procedure: LUMBAR SPINAL CORD STIMULATOR REMOVAL;  Surgeon: Faythe Ghee, MD;  Location: [place] [facility];  Service: Neurosurgery;  Laterality: N/A;   TONSILLECTOMY  TOTAL HIP ARTHROPLASTY Right 05/01/2022   Procedure: TOTAL HIP ARTHROPLASTY ANTERIOR APPROACH;  Surgeon: Gaynelle Arabian, MD;  Location: [facility];  Service: Orthopedics;  Laterality: Right;   ULNAR NERVE TRANSPOSITION Right 07/15/2015   supercharged AIN to deep motor branch ulnar nerve nerve transfer   UMBILICAL HERNIA REPAIR N/A 03/28/2016   Procedure: LAPAROSCOPIC UMBILICAL HERNIA;  Surgeon: Rolm Bookbinder, MD;  Location: [facility];  Service: General;  Laterality: N/A;   FAMILY HISTORY Family History  Problem Relation Age of Onset   Cancer Mother    Alzheimer's disease Father    Colon cancer Neg Hx    Colon polyps Neg Hx    Esophageal cancer Neg Hx    Stomach cancer Neg Hx    Rectal cancer Neg Hx    SOCIAL HISTORY Social History   Tobacco Use   Smoking status: Never   Smokeless tobacco: Never  Vaping Use   Vaping Use: Never used  Substance Use Topics   Alcohol use: Yes    Alcohol/week: 1.0 - 2.0 standard drink of alcohol    Types: 1 - 2 Cans of beer per week    Comment: socially   Drug use: No       OPHTHALMIC EXAM:  Base Eye Exam     Visual Acuity (Snellen - Linear)       Right Left   Dist [place] 20/25 +2 20/400 -1   Dist ph [place] NI NI         Tonometry (Tonopen, 8:09 AM)       Right Left   Pressure 21 17         Pupils       Dark Light Shape React APD   Right 3 2 Round Brisk None   Left  3 3 Round NR None         Visual Fields (Counting fingers)       Left Right     Full   Restrictions Partial inner superior temporal, inferior temporal, superior nasal, inferior nasal deficiencies          Extraocular Movement       Right Left    Full, Ortho Full, Ortho         Neuro/Psych     Oriented x3: Yes   Mood/Affect: Normal         Dilation     Both eyes: 1.0% Mydriacyl, 2.5% Phenylephrine @ 8:09 AM           Slit Lamp and Fundus Exam     Slit Lamp Exam       Right Left   Lids/Lashes Dermatochalasis - upper lid Dermatochalasis - upper lid   Conjunctiva/Sclera White and quiet nasal and temporal pinguecula, sutures dissolving, trace injection, Subconjunctival hemorrhage -- improved   Cornea mild arcus, tear film debris, well healed cataract wound arcus, trace PEE, well healed cataract wound   Anterior Chamber deep and clear deep and clear   Iris Round and dilated Round and moderately dilated to 6.25   Lens PC IOL in good position PC IOL in good position   Anterior Vitreous mild syneresis post vitrectomy, 45% gas bubble         Fundus Exam       Right Left   Disc mild Pallor, Sharp rim mild Pallor, Sharp rim, PPA   C/D Ratio 0.2 0.3   Macula Flat, Good foveal reflex, RPE mottling, No heme or edema Flat, Blunted foveal reflex, mac hole closed, RPE mottling, No  heme or edema   Vessels mild attenuation, mild tortuosity mild attenuation, mild tortuosity   Periphery Attached Attached, good 360 peripheral laser changes           IMAGING AND PROCEDURES  Imaging and Procedures for 06/22/2022  OCT, Retina - OU - Both Eyes       Right Eye Quality was good. Central Foveal Thickness: 274. Progression has been stable. Findings include normal foveal contour, no IRF, no SRF.   Left Eye Quality was good. Central Foveal Thickness: 303. Progression has improved. Findings include normal foveal contour, no IRF, no SRF, outer retinal atrophy (Mac hole  closed; mild thinning of central EZ; tr vit opacities).   Notes *Images captured and stored on drive  Diagnosis / Impression:  OD: NFP, no IRF/SRF OS: Mac hole closed; mild thinning of central EZ; tr vit opacities  Clinical management:  See below  Abbreviations: NFP - Normal foveal profile. CME - cystoid macular edema. PED - pigment epithelial detachment. IRF - intraretinal fluid. SRF - subretinal fluid. EZ - ellipsoid zone. ERM - epiretinal membrane. ORA - outer retinal atrophy. ORT - outer retinal tubulation. SRHM - subretinal hyper-reflective material. IRHM - intraretinal hyper-reflective material            ASSESSMENT/PLAN:    ICD-10-CM   1. Macular hole of left eye  H35.342 OCT, Retina - OU - Both Eyes    2. Essential hypertension  I10     3. Hypertensive retinopathy of both eyes  H35.033     4. Pseudophakia, both eyes  Z96.1      Macular hole, left eye   - pre op OCT shows stable, thin FTMH, mild interval improvement in surrounding cystic changes  - pre op BCVA 20/70 - POW4 s/p PPV/TissueBlue/MP/14% C3F8 OS, 10.12.23             - doing well             - mac hole closed, retina attached  - gas bubble 45%  - BCVA: 20/400             - IOP okay at 17             - decrease PF to BID OS  - PSO ung PRN              - avoid laying flat on back              - post op drop and positioning instructions reviewed   - f/u 3-4 wks  2,3. Hypertensive retinopathy OU - discussed importance of tight BP control - monitor  4. Pseudophakia OU  - s/p CE/IOL OU (Dr. Talbert Forest)  - IOLs in good position, doing well  - monitor  Ophthalmic Meds Ordered this visit:  Meds ordered this encounter  Medications   prednisoLONE acetate (PRED FORTE) 1 % ophthalmic suspension    Sig: Place 1 drop into the left eye 4 (four) times daily.    Dispense:  10 mL    Refill:  1     Return for f/u 3-4 weeks, mac hole OS, DFE, OCT.  There are no Patient Instructions on file for this  visit.   Explained the diagnoses, plan, and follow up with the patient and they expressed understanding.  Patient expressed understanding of the importance of proper follow up care.   This document serves as a record of services personally performed by Gardiner Sleeper, MD, PhD. It was created on their  behalf by San Jetty. Owens Shark, OA an ophthalmic technician. The creation of this record is the provider's dictation and/or activities during the visit.    Electronically signed by: San Jetty. Marguerita Merles 11.09.2023 10:37 AM  Gardiner Sleeper, M.D., Ph.D. Diseases & Surgery of the Retina and Vitreous [facility]  I have reviewed the above documentation for accuracy and completeness, and I agree with the above. Gardiner Sleeper, M.D., Ph.D. 06/22/22 10:39 AM  Abbreviations: M myopia (nearsighted); A astigmatism; H hyperopia (farsighted); P presbyopia; Mrx spectacle prescription;  CTL contact lenses; OD right eye; OS left eye; OU both eyes  XT exotropia; ET esotropia; PEK punctate epithelial keratitis; PEE punctate epithelial erosions; DES dry eye syndrome; MGD meibomian gland dysfunction; ATs artificial tears; PFAT's preservative free artificial tears; [place] nuclear sclerotic cataract; PSC posterior subcapsular cataract; ERM epi-retinal membrane; PVD posterior vitreous detachment; RD retinal detachment; DM diabetes mellitus; DR diabetic retinopathy; NPDR non-proliferative diabetic retinopathy; PDR proliferative diabetic retinopathy; CSME clinically significant macular edema; DME diabetic macular edema; dbh dot blot hemorrhages; CWS cotton wool spot; POAG primary open angle glaucoma; C/D cup-to-disc ratio; HVF humphrey visual field; GVF goldmann visual field; OCT optical coherence tomography; IOP intraocular pressure; BRVO Branch retinal vein occlusion; CRVO central retinal vein occlusion; CRAO central retinal artery occlusion; BRAO branch retinal artery occlusion; RT retinal tear; SB scleral  buckle; PPV pars plana vitrectomy; VH Vitreous hemorrhage; PRP panretinal laser photocoagulation; IVK intravitreal kenalog; VMT vitreomacular traction; MH Macular hole;  NVD neovascularization of the disc; NVE neovascularization elsewhere; AREDS age related eye disease study; ARMD age related macular degeneration; POAG primary open angle glaucoma; EBMD epithelial/anterior basement membrane dystrophy; ACIOL anterior chamber intraocular lens; IOL intraocular lens; PCIOL posterior chamber intraocular lens; Phaco/IOL phacoemulsification with intraocular lens placement; [place] photorefractive keratectomy; LASIK laser assisted in situ keratomileusis; HTN hypertension; DM diabetes mellitus; COPD chronic obstructive pulmonary disease

## 2022-06-13 DIAGNOSIS — Z5189 Encounter for other specified aftercare: Secondary | ICD-10-CM | POA: Diagnosis not present

## 2022-06-22 ENCOUNTER — Encounter (INDEPENDENT_AMBULATORY_CARE_PROVIDER_SITE_OTHER): Payer: Self-pay | Admitting: Ophthalmology

## 2022-06-22 ENCOUNTER — Ambulatory Visit (INDEPENDENT_AMBULATORY_CARE_PROVIDER_SITE_OTHER): Payer: Medicare HMO | Admitting: Ophthalmology

## 2022-06-22 DIAGNOSIS — I10 Essential (primary) hypertension: Secondary | ICD-10-CM | POA: Diagnosis not present

## 2022-06-22 DIAGNOSIS — H35033 Hypertensive retinopathy, bilateral: Secondary | ICD-10-CM

## 2022-06-22 DIAGNOSIS — H35342 Macular cyst, hole, or pseudohole, left eye: Secondary | ICD-10-CM

## 2022-06-22 DIAGNOSIS — Z961 Presence of intraocular lens: Secondary | ICD-10-CM

## 2022-06-22 MED ORDER — PREDNISOLONE ACETATE 1 % OP SUSP: 1.0000 [drp] | Freq: Four times a day (QID) | OPHTHALMIC | 1 refills | Status: DC

## 2022-06-26 ENCOUNTER — Other Ambulatory Visit: Payer: Self-pay | Admitting: Interventional Cardiology

## 2022-06-28 DIAGNOSIS — S63592D Other specified sprain of left wrist, subsequent encounter: Secondary | ICD-10-CM | POA: Diagnosis not present

## 2022-06-28 DIAGNOSIS — S52501D Unspecified fracture of the lower end of right radius, subsequent encounter for closed fracture with routine healing: Secondary | ICD-10-CM | POA: Diagnosis not present

## 2022-06-28 DIAGNOSIS — M25531 Pain in right wrist: Secondary | ICD-10-CM | POA: Diagnosis not present

## 2022-06-28 DIAGNOSIS — S63591D Other specified sprain of right wrist, subsequent encounter: Secondary | ICD-10-CM | POA: Diagnosis not present

## 2022-06-29 NOTE — Progress Notes (Signed)
[facility] Note  07/13/2022     CHIEF COMPLAINT Patient presents for Post-op Follow-up   HISTORY OF PRESENT ILLNESS: Anthony Hebert is a 71 y.o. male who presents to the clinic today for:   HPI     Post-op Follow-up   In left eye.  Discomfort includes none.  Vision is improved.  I, the attending physician,  performed the HPI with the patient and updated documentation appropriately.        Comments   3 week s/p ppv,mp. Patient states he can tell the bubble is getting smaller, vision is the same      Last edited by Bernarda Caffey, MD on 07/14/2022  3:57 PM.    Pt states gas bubble is about half the size it was last time, pt has to lean his face down in order to read the eye chart, he is using PF BID and no ointment  Referring physician: Aura Dials, MD [place],  [place] [address]  HISTORICAL INFORMATION:   Selected notes from the MEDICAL RECORD NUMBER Referred by Dr. Lucita Ferrara for mac hole OS LEE:  Ocular Hx- PMH-    CURRENT MEDICATIONS: Current Outpatient Medications (Ophthalmic Drugs)  Medication Sig   bacitracin-polymyxin b (POLYSPORIN) ophthalmic ointment Place 1 Application into the left eye at bedtime. Apply 1/2inch ribbon to left eye and bedtime and as needed.   Polyvinyl Alcohol-Povidone (REFRESH OP) Place 1 drop into both eyes daily as needed (dry eyes).   prednisoLONE acetate (PRED FORTE) 1 % ophthalmic suspension Place 1 drop into the left eye 4 (four) times daily.   RESTASIS 0.05 % ophthalmic emulsion Place 1 drop into both eyes 2 (two) times daily.   Difluprednate 0.05 % EMUL Place 1 drop into the left eye 4 (four) times daily.   PROLENSA 0.07 % SOLN Place 1 drop into the left eye 2 (two) times daily.   No current facility-administered medications for this visit. (Ophthalmic Drugs)   Current Outpatient Medications (Other)  Medication Sig   allopurinol (ZYLOPRIM) 100 MG tablet Take 200 mg by mouth daily.    bismuth subsalicylate (PEPTO BISMOL) 262 MG chewable tablet Chew 262 mg by mouth as needed for indigestion.   gabapentin (NEURONTIN) 100 MG capsule Take 100 mg by mouth 3 (three) times daily.   lisinopril (ZESTRIL) 20 MG tablet Take 20 mg by mouth daily.   methocarbamol (ROBAXIN) 500 MG tablet Take 1 tablet (500 mg total) by mouth every 6 (six) hours as needed for muscle spasms.   oxyCODONE (ROXICODONE) 5 MG immediate release tablet Take 1-2 tablets (5-10 mg total) by mouth every 6 (six) hours as needed for severe pain.   rosuvastatin (CRESTOR) 20 MG tablet Take 1 tablet (20 mg total) by mouth daily.   traMADol (ULTRAM) 50 MG tablet Take 1-2 tablets (50-100 mg total) by mouth every 6 (six) hours as needed for moderate pain.   No current facility-administered medications for this visit. (Other)   REVIEW OF SYSTEMS: ROS   Positive for: Musculoskeletal, Cardiovascular, Eyes Negative for: Constitutional, Gastrointestinal, Neurological, Skin, Genitourinary, HENT, Endocrine, Respiratory, Psychiatric, Allergic/Imm, Heme/Lymph Last edited by Elmore Guise, COT on 07/13/2022  7:54 AM.     ALLERGIES No Known Allergies  PAST MEDICAL HISTORY Past Medical History:  Diagnosis Date   Acute idiopathic gout of right ankle    Aortic valve sclerosis    Arthritis    "all over"   Chronic back pain    "  all over"   Complication of anesthesia    slow to wake up at times   DDD (degenerative disc disease), lumbar    Difficult intubation    had trouble with intubation shape of throat;last sx no problems per pt; 03/28/16 successful oral ETT with green Glidescope, stylet. Has limited oral opening/small glottic opening   Foot swelling    in [place] 07/2013   GERD (gastroesophageal reflux disease)    occ   Gout of left foot 10/2011   in [place]   Heart murmur    states no problems   History of melanoma    Hypercholesterolemia    Hyperlipidemia    Hypertension    Melanoma ([place]) ~ 2010   "above left eye"    Nonrheumatic aortic valve stenosis    Other chronic pain    Primary osteoarthritis involving multiple joints    Rising PSA level    Screening for prostate cancer    Stroke [facility])    "maybe w/my 1st back OR in the 1990's"; denies residual on 07/15/2015   Use of cane as ambulatory aid    straight   Walker as ambulation aid    Past Surgical History:  Procedure Laterality Date   25 GAUGE PARS PLANA VITRECTOMY WITH 20 GAUGE MVR PORT FOR MACULAR HOLE Left 05/25/2022   Procedure: 25 GAUGE PARS PLANA VITRECTOMY WITH 20 GAUGE MVR PORT FOR MACULAR HOLE;  Surgeon: Bernarda Caffey, MD;  Location: [place];  Service: Ophthalmology;  Laterality: Left;   APPENDECTOMY     BACK SURGERY     CARPAL TUNNEL RELEASE Right 07/15/2015   w/GUYON'S CANAL RELEASE    CARPAL TUNNEL RELEASE Right 07/15/2015   Procedure: RIGHT FOREARM AND WRIST CARPAL TUNNEL RELEASE;  Surgeon: Roseanne Kaufman, MD;  Location: [place];  Service: Orthopedics;  Laterality: Right;   CARPAL TUNNEL RELEASE Left 03/22/2017   Procedure: OPEN CARPAL TUNNEL RELEASE;  Surgeon: Roseanne Kaufman, MD;  Location: [place];  Service: Orthopedics;  Laterality: Left;  60 mins   COLONOSCOPY     COLONOSCOPY WITH PROPOFOL N/A 01/05/2022   Procedure: COLONOSCOPY WITH PROPOFOL;  Surgeon: Milus Banister, MD;  Location: [facility];  Service: Gastroenterology;  Laterality: N/A;   FOOT SURGERY Right    cut nerve to relieve pain   GAS INSERTION Left 05/25/2022   Procedure: INSERTION OF GAS;  Surgeon: Bernarda Caffey, MD;  Location: [place];  Service: Ophthalmology;  Laterality: Left;   HEMOSTASIS CLIP PLACEMENT  01/05/2022   Procedure: HEMOSTASIS CLIP PLACEMENT;  Surgeon: Milus Banister, MD;  Location: [facility];  Service: Gastroenterology;;   INGUINAL HERNIA REPAIR Left [place] N/A 03/28/2016   Procedure: INSERTION OF MESH;  Surgeon: Rolm Bookbinder, MD;  Location: [place];  Service: General;  Laterality: N/A;   MELANOMA EXCISION     MEMBRANE PEEL Left  05/25/2022   Procedure: MEMBRANE PEEL;  Surgeon: Bernarda Caffey, MD;  Location: [place];  Service: Ophthalmology;  Laterality: Left;   POLYPECTOMY  01/05/2022   Procedure: POLYPECTOMY;  Surgeon: Milus Banister, MD;  Location: [facility];  Service: Gastroenterology;;   POSTERIOR CERVICAL LAMINECTOMY Right 10/07/2013   Procedure: POSTERIOR CERVICAL LAMINECTOMY RIGHT CERVICAL SEVEN -THORACIC ONE FORAMINOTOMY;  Surgeon: Faythe Ghee, MD;  Location: [place] [facility];  Service: Neurosurgery;  Laterality: Right;   POSTERIOR LUMBAR FUSION  X 7   SPINAL CORD STIMULATOR IMPLANT  ~ 2012   SPINAL CORD STIMULATOR REMOVAL N/A 09/19/2012   Procedure: LUMBAR  SPINAL CORD STIMULATOR REMOVAL;  Surgeon: Faythe Ghee, MD;  Location: [place] [facility];  Service: Neurosurgery;  Laterality: N/A;   TONSILLECTOMY     TOTAL HIP ARTHROPLASTY Right 05/01/2022   Procedure: TOTAL HIP ARTHROPLASTY ANTERIOR APPROACH;  Surgeon: Gaynelle Arabian, MD;  Location: [facility];  Service: Orthopedics;  Laterality: Right;   ULNAR NERVE TRANSPOSITION Right 07/15/2015   supercharged AIN to deep motor branch ulnar nerve nerve transfer   UMBILICAL HERNIA REPAIR N/A 03/28/2016   Procedure: LAPAROSCOPIC UMBILICAL HERNIA;  Surgeon: Rolm Bookbinder, MD;  Location: [facility];  Service: General;  Laterality: N/A;   FAMILY HISTORY Family History  Problem Relation Age of Onset   Cancer Mother    Alzheimer's disease Father    Colon cancer Neg Hx    Colon polyps Neg Hx    Esophageal cancer Neg Hx    Stomach cancer Neg Hx    Rectal cancer Neg Hx    SOCIAL HISTORY Social History   Tobacco Use   Smoking status: Never   Smokeless tobacco: Never  Vaping Use   Vaping Use: Never used  Substance Use Topics   Alcohol use: Yes    Alcohol/week: 1.0 - 2.0 standard drink of alcohol    Types: 1 - 2 Cans of beer per week    Comment: socially   Drug use: No       OPHTHALMIC EXAM:  Base Eye Exam     Visual Acuity (Snellen - Linear)       Right Left    Dist [place] 20/25-2 20/100 -2   Dist ph [place] 20/25 20/NI  Leaning face down slightly; 20/400 OS with normal head position        Tonometry (Tonopen, 8:02 AM)       Right Left   Pressure 22 20         Pupils       Dark Light Shape React APD   Right 3 2 Round Brisk None   Left 3 2 Round Minimal None         Visual Fields (Counting fingers)       Left Right     Full   Restrictions Partial inner superior temporal, inferior temporal, superior nasal, inferior nasal deficiencies          Extraocular Movement       Right Left    Full, Ortho Full, Ortho         Neuro/Psych     Oriented x3: Yes   Mood/Affect: Normal         Dilation     Both eyes: 1.0% Mydriacyl, 2.5% Phenylephrine @ 8:02 AM           Slit Lamp and Fundus Exam     Slit Lamp Exam       Right Left   Lids/Lashes Dermatochalasis - upper lid Dermatochalasis - upper lid   Conjunctiva/Sclera White and quiet nasal and temporal pinguecula   Cornea mild arcus, tear film debris, well healed cataract wound arcus, trace PEE, well healed cataract wound   Anterior Chamber deep and clear deep and clear   Iris Round and dilated Round and dilated   Lens PC IOL in good position PC IOL in good position, small PC opening   Anterior Vitreous mild syneresis post vitrectomy, mild cell/pigment, 5-10% gas bubble         Fundus Exam       Right Left   Disc mild Pallor, Sharp rim 3+temporal Pallor, [place] Ard  rim, PPA, focal PPP   C/D Ratio 0.2 0.3   Macula Flat, Good foveal reflex, RPE mottling, No heme or edema Flat, Blunted foveal reflex, mac hole closed, mild RPE mottling, No heme or edema   Vessels mild attenuation, mild tortuosity attenuated, mild tortuosity, mild copper wiring   Periphery Attached Attached, good 360 peripheral laser changes           IMAGING AND PROCEDURES  Imaging and Procedures for 07/13/2022  OCT, Retina - OU - Both Eyes       Right Eye Quality was good. Central Foveal  Thickness: 270. Progression has been stable. Findings include normal foveal contour, no IRF, no SRF.   Left Eye Quality was good. Central Foveal Thickness: 302. Progression has improved. Findings include no IRF, no SRF, abnormal foveal contour, outer retinal atrophy (Mac hole closed; mild decrease in ellipsoid signal centrally; tr vit opacities).   Notes *Images captured and stored on drive  Diagnosis / Impression:  OD: NFP, no IRF/SRF OS: Mac hole closed; mild decrease in ellipsoid signal centrally; tr vit opacities  Clinical management:  See below  Abbreviations: NFP - Normal foveal profile. CME - cystoid macular edema. PED - pigment epithelial detachment. IRF - intraretinal fluid. SRF - subretinal fluid. EZ - ellipsoid zone. ERM - epiretinal membrane. ORA - outer retinal atrophy. ORT - outer retinal tubulation. SRHM - subretinal hyper-reflective material. IRHM - intraretinal hyper-reflective material            ASSESSMENT/PLAN:    ICD-10-CM   1. Macular hole of left eye  H35.342 OCT, Retina - OU - Both Eyes    2. NAION (non-arteritic anterior ischemic optic neuropathy), left  H47.012     3. Essential hypertension  I10     4. Hypertensive retinopathy of both eyes  H35.033     5. Pseudophakia, both eyes  Z96.1      Macular hole, left eye   - pre op OCT shows stable, thin FTMH, mild interval improvement in surrounding cystic changes  - pre op BCVA 20/70 - POW7 s/p PPV/TissueBlue/MP/14% C3F8 OS, 10.12.23             - mac hole closed, retina attached  - gas bubble 5-10%  - BCVA: 20/100-2 eccentric vision; ~20/400 centrally             - IOP okay at 20             - cont PF BID OS  - PSO ung PRN              - avoid laying flat on back              - post op drop and positioning instructions reviewed   - f/u 2 wks, DFE, OCT, HVF 30-2  2. NAION OS  - decreased vision out of proportion with post-op course -- mac hole closed nicely and gas bubble not impeding central  macula  - pt denies temporal pain, jaw claudication, headache; ROS for GCA negative  - exam shows pale disc  - suspect NAION OS  - recommend restarting ASA  - will check formal HVF 30-2 once gas bubble is completely gone in ~2wks  3,4. Hypertensive retinopathy OU - discussed importance of tight BP control - monitor  5. Pseudophakia OU  - s/p CE/IOL OU (Dr. Talbert Forest)  - IOLs in good position, doing well  - monitor  Ophthalmic Meds Ordered this visit:  No orders of the defined types  were placed in this encounter.    Return in about 2 weeks (around 07/27/2022) for f/u mac hole OS, DFE, OCT, HVF.  There are no Patient Instructions on file for this visit.   Explained the diagnoses, plan, and follow up with the patient and they expressed understanding.  Patient expressed understanding of the importance of proper follow up care.   This document serves as a record of services personally performed by Gardiner Sleeper, MD, PhD. It was created on their behalf by San Jetty. Owens Shark, OA an ophthalmic technician. The creation of this record is the provider's dictation and/or activities during the visit.    Electronically signed by: San Jetty. Owens Shark, [place] 11.16.2023 4:00 PM  Gardiner Sleeper, M.D., Ph.D. Diseases & Surgery of the Retina and Vitreous [facility]  I have reviewed the above documentation for accuracy and completeness, and I agree with the above. Gardiner Sleeper, M.D., Ph.D. 07/14/22 4:06 PM   Abbreviations: M myopia (nearsighted); A astigmatism; H hyperopia (farsighted); P presbyopia; Mrx spectacle prescription;  CTL contact lenses; OD right eye; OS left eye; OU both eyes  XT exotropia; ET esotropia; PEK punctate epithelial keratitis; PEE punctate epithelial erosions; DES dry eye syndrome; MGD meibomian gland dysfunction; ATs artificial tears; PFAT's preservative free artificial tears; [place] nuclear sclerotic cataract; PSC posterior subcapsular cataract; ERM  epi-retinal membrane; PVD posterior vitreous detachment; RD retinal detachment; DM diabetes mellitus; DR diabetic retinopathy; NPDR non-proliferative diabetic retinopathy; PDR proliferative diabetic retinopathy; CSME clinically significant macular edema; DME diabetic macular edema; dbh dot blot hemorrhages; CWS cotton wool spot; POAG primary open angle glaucoma; C/D cup-to-disc ratio; HVF humphrey visual field; GVF goldmann visual field; OCT optical coherence tomography; IOP intraocular pressure; BRVO Branch retinal vein occlusion; CRVO central retinal vein occlusion; CRAO central retinal artery occlusion; BRAO branch retinal artery occlusion; RT retinal tear; SB scleral buckle; PPV pars plana vitrectomy; VH Vitreous hemorrhage; PRP panretinal laser photocoagulation; IVK intravitreal kenalog; VMT vitreomacular traction; MH Macular hole;  NVD neovascularization of the disc; NVE neovascularization elsewhere; AREDS age related eye disease study; ARMD age related macular degeneration; POAG primary open angle glaucoma; EBMD epithelial/anterior basement membrane dystrophy; ACIOL anterior chamber intraocular lens; IOL intraocular lens; PCIOL posterior chamber intraocular lens; Phaco/IOL phacoemulsification with intraocular lens placement; [place] photorefractive keratectomy; LASIK laser assisted in situ keratomileusis; HTN hypertension; DM diabetes mellitus; COPD chronic obstructive pulmonary disease

## 2022-07-13 ENCOUNTER — Ambulatory Visit (INDEPENDENT_AMBULATORY_CARE_PROVIDER_SITE_OTHER): Payer: Medicare HMO | Admitting: Ophthalmology

## 2022-07-13 ENCOUNTER — Encounter (INDEPENDENT_AMBULATORY_CARE_PROVIDER_SITE_OTHER): Payer: Self-pay | Admitting: Ophthalmology

## 2022-07-13 DIAGNOSIS — I10 Essential (primary) hypertension: Secondary | ICD-10-CM | POA: Diagnosis not present

## 2022-07-13 DIAGNOSIS — H35342 Macular cyst, hole, or pseudohole, left eye: Secondary | ICD-10-CM

## 2022-07-13 DIAGNOSIS — Z961 Presence of intraocular lens: Secondary | ICD-10-CM

## 2022-07-13 DIAGNOSIS — H35033 Hypertensive retinopathy, bilateral: Secondary | ICD-10-CM | POA: Diagnosis not present

## 2022-07-13 DIAGNOSIS — H47012 Ischemic optic neuropathy, left eye: Secondary | ICD-10-CM

## 2022-07-13 DIAGNOSIS — S32049A Unspecified fracture of fourth lumbar vertebra, initial encounter for closed fracture: Secondary | ICD-10-CM | POA: Diagnosis not present

## 2022-07-14 ENCOUNTER — Encounter (INDEPENDENT_AMBULATORY_CARE_PROVIDER_SITE_OTHER): Payer: Self-pay | Admitting: Ophthalmology

## 2022-07-24 NOTE — Progress Notes (Unsigned)
Cardiology Office Note   Date:  07/25/2022   ID:  Carnie, Bruemmer 1950/09/12, MRN [id number]  PCP:  Aura Dials, MD    No chief complaint on file.  Aortic sclerosis  Wt Readings from Last 3 Encounters:  07/25/22 211 lb (95.7 kg)  05/25/22 195 lb (88.5 kg)  05/01/22 205 lb 0.4 oz (93 kg)       History of Present Illness: Anthony Hebert is a 71 y.o. male who I saw for the first time in 2022.  Prior records show:  "He has a h/o MI about 25 years ago after back surgery. ECG showed the MI but he never had any other heart tests done at that time. He does not recall having chest pain. He has been on meds for HTN and high cholesterol, for at least 15 years. "   "Had low back stimulator removed in 2/14. No cardiac issues.  Tolerated neck surgery in 2/15 for pinched nerve. "   2019 echo showed: "Left ventricle: The cavity size was normal. Wall thickness was    normal. Systolic function was normal. The estimated ejection    fraction was in the range of 55% to 60%. Wall motion was normal;    there were no regional wall motion abnormalities.  - Aortic valve: There was mild stenosis. Valve area (VTI): 1.7    cm^2. Valve area (Vmax): 1.67 cm^2. Valve area (Vmean): 1.78    cm^2.  - Pulmonary arteries: PA peak pressure: 31 mm Hg (S).   Impressions:   - Technically difficult; definity used; normal LV systolic    function; probable mild diastolic dysfunction; mild AS with mean    gradient 10 mmHg); mild TR. "  He had COVID in August 2022.  He had eye surgery in 2022. As of 2022, he was exercising in the gym with weights and a bicycle.  He would go 3-4 times a week.  Had a fall in 2023, fell off a ladder and broke wrist and L4 fracture.  He has had several surgeries, eye and hip replacement.  No cardiac issues.  Denies : Chest pain. Dizziness. Leg edema. Nitroglycerin use. Orthopnea. Palpitations. Paroxysmal nocturnal dyspnea. Shortness of breath. Syncope.     Past  Medical History:  Diagnosis Date   Acute idiopathic gout of right ankle    Aortic valve sclerosis    Arthritis    "all over"   Chronic back pain    "all over"   Complication of anesthesia    slow to wake up at times   DDD (degenerative disc disease), lumbar    Difficult intubation    had trouble with intubation shape of throat;last sx no problems per pt; 03/28/16 successful oral ETT with green Glidescope, stylet. Has limited oral opening/small glottic opening   Foot swelling    in [place] 07/2013   GERD (gastroesophageal reflux disease)    occ   Gout of left foot 10/2011   in [place]   Heart murmur    states no problems   History of melanoma    Hypercholesterolemia    Hyperlipidemia    Hypertension    Melanoma ([place]) ~ 2010   "above left eye"   Nonrheumatic aortic valve stenosis    Other chronic pain    Primary osteoarthritis involving multiple joints    Rising PSA level    Screening for prostate cancer    Stroke [facility])    "maybe w/my 1st back OR in the 1990's"; denies  residual on 07/15/2015   Use of cane as ambulatory aid    straight   Walker as ambulation aid     Past Surgical History:  Procedure Laterality Date   25 GAUGE PARS PLANA VITRECTOMY WITH 20 GAUGE MVR PORT FOR MACULAR HOLE Left 05/25/2022   Procedure: 25 GAUGE PARS PLANA VITRECTOMY WITH 20 GAUGE MVR PORT FOR MACULAR HOLE;  Surgeon: Bernarda Caffey, MD;  Location: [place];  Service: Ophthalmology;  Laterality: Left;   APPENDECTOMY     BACK SURGERY     CARPAL TUNNEL RELEASE Right 07/15/2015   w/GUYON'S CANAL RELEASE    CARPAL TUNNEL RELEASE Right 07/15/2015   Procedure: RIGHT FOREARM AND WRIST CARPAL TUNNEL RELEASE;  Surgeon: Roseanne Kaufman, MD;  Location: [place];  Service: Orthopedics;  Laterality: Right;   CARPAL TUNNEL RELEASE Left 03/22/2017   Procedure: OPEN CARPAL TUNNEL RELEASE;  Surgeon: Roseanne Kaufman, MD;  Location: [place];  Service: Orthopedics;  Laterality: Left;  60 mins   COLONOSCOPY     COLONOSCOPY WITH PROPOFOL  N/A 01/05/2022   Procedure: COLONOSCOPY WITH PROPOFOL;  Surgeon: Milus Banister, MD;  Location: [facility];  Service: Gastroenterology;  Laterality: N/A;   FOOT SURGERY Right    cut nerve to relieve pain   GAS INSERTION Left 05/25/2022   Procedure: INSERTION OF GAS;  Surgeon: Bernarda Caffey, MD;  Location: [place];  Service: Ophthalmology;  Laterality: Left;   HEMOSTASIS CLIP PLACEMENT  01/05/2022   Procedure: HEMOSTASIS CLIP PLACEMENT;  Surgeon: Milus Banister, MD;  Location: [facility];  Service: Gastroenterology;;   INGUINAL HERNIA REPAIR Left [place] N/A 03/28/2016   Procedure: INSERTION OF MESH;  Surgeon: Rolm Bookbinder, MD;  Location: [place];  Service: General;  Laterality: N/A;   MELANOMA EXCISION     MEMBRANE PEEL Left 05/25/2022   Procedure: MEMBRANE PEEL;  Surgeon: Bernarda Caffey, MD;  Location: [place];  Service: Ophthalmology;  Laterality: Left;   POLYPECTOMY  01/05/2022   Procedure: POLYPECTOMY;  Surgeon: Milus Banister, MD;  Location: [facility];  Service: Gastroenterology;;   POSTERIOR CERVICAL LAMINECTOMY Right 10/07/2013   Procedure: POSTERIOR CERVICAL LAMINECTOMY RIGHT CERVICAL SEVEN -THORACIC ONE FORAMINOTOMY;  Surgeon: Faythe Ghee, MD;  Location: [facility];  Service: Neurosurgery;  Laterality: Right;   POSTERIOR LUMBAR FUSION  X 7   SPINAL CORD STIMULATOR IMPLANT  ~ 2012   SPINAL CORD STIMULATOR REMOVAL N/A 09/19/2012   Procedure: LUMBAR SPINAL CORD STIMULATOR REMOVAL;  Surgeon: Faythe Ghee, MD;  Location: [place] [facility];  Service: Neurosurgery;  Laterality: N/A;   TONSILLECTOMY     TOTAL HIP ARTHROPLASTY Right 05/01/2022   Procedure: TOTAL HIP ARTHROPLASTY ANTERIOR APPROACH;  Surgeon: Gaynelle Arabian, MD;  Location: [facility];  Service: Orthopedics;  Laterality: Right;   ULNAR NERVE TRANSPOSITION Right 07/15/2015   supercharged AIN to deep motor branch ulnar nerve nerve transfer   UMBILICAL HERNIA REPAIR N/A 03/28/2016   Procedure: LAPAROSCOPIC  UMBILICAL HERNIA;  Surgeon: Rolm Bookbinder, MD;  Location: [place] [facility];  Service: General;  Laterality: N/A;     Current Outpatient Medications  Medication Sig Dispense Refill   allopurinol (ZYLOPRIM) 100 MG tablet Take 200 mg by mouth daily.     bismuth subsalicylate (PEPTO BISMOL) 262 MG chewable tablet Chew 262 mg by mouth as needed for indigestion.     gabapentin (NEURONTIN) 100 MG capsule Take 100 mg by mouth 3 (three) times daily.     lisinopril (ZESTRIL) 20 MG tablet  Take 20 mg by mouth daily.     methocarbamol (ROBAXIN) 500 MG tablet Take 1 tablet (500 mg total) by mouth every 6 (six) hours as needed for muscle spasms. 40 tablet 0   oxyCODONE (ROXICODONE) 5 MG immediate release tablet Take 1-2 tablets (5-10 mg total) by mouth every 6 (six) hours as needed for severe pain. 42 tablet 0   prednisoLONE acetate (PRED FORTE) 1 % ophthalmic suspension Place 1 drop into the left eye 4 (four) times daily. 10 mL 1   RESTASIS 0.05 % ophthalmic emulsion Place 1 drop into both eyes 2 (two) times daily.     rosuvastatin (CRESTOR) 20 MG tablet Take 1 tablet (20 mg total) by mouth daily. 90 tablet 3   traMADol (ULTRAM) 50 MG tablet Take 1-2 tablets (50-100 mg total) by mouth every 6 (six) hours as needed for moderate pain. 40 tablet 0   Polyvinyl Alcohol-Povidone (REFRESH OP) Place 1 drop into both eyes daily as needed (dry eyes).     triamcinolone cream (KENALOG) 0.1 % APPLY TWICE DAILY TO THE AFFECTED AREAS FOR 14 DAYS     No current facility-administered medications for this visit.    Allergies:   Patient has no known allergies.    Social History:  The patient  reports that he has never smoked. He has never used smokeless tobacco. He reports current alcohol use of about 1.0 - 2.0 standard drink of alcohol per week. He reports that he does not use drugs.   Family History:  The patient's family history includes Alzheimer's disease in his father; Cancer in his mother.    ROS:  Please see the  history of present illness.   Otherwise, review of systems are positive for visual issues.   All other systems are reviewed and negative.    PHYSICAL EXAM: VS:  BP 126/74   Pulse 70   Ht '5\' 5"'$  (1.651 m)   Wt 211 lb (95.7 kg)   SpO2 93%   BMI 35.11 kg/m  , BMI Body mass index is 35.11 kg/m. GEN: Well nourished, well developed, in no acute distress HEENT: normal Neck: no JVD, carotid bruits, or masses Cardiac: RRR; 2/6 systolic murmurs, rubs, or gallops,no edema  Respiratory:  clear to auscultation bilaterally, normal work of breathing GI: soft, nontender, nondistended, + BS MS: no deformity or atrophy Skin: warm and dry, no rash Neuro:  Strength and sensation are intact Psych: euthymic mood, full affect   EKG:   The ekg ordered in July 2023 demonstrates NSR, IRBBB, PVC   Recent Labs: 09/20/2021: ALT 41 05/25/2022: BUN 24; Creatinine, Ser 1.04; Hemoglobin 11.0; Platelets 222; Potassium 4.5; Sodium 137   Lipid Panel    Component Value Date/Time   CHOL 148 09/20/2021 1028   TRIG 61 09/20/2021 1028   HDL 65 09/20/2021 1028   CHOLHDL 2.3 09/20/2021 1028   LDLCALC 70 09/20/2021 1028     Other studies Reviewed: Additional studies/ records that were reviewed today with results demonstrating: labs reviewed.   ASSESSMENT AND PLAN:  Aortic stenosis: Mild aortic stenosis in 2019.  No symptoms of severe aortic stenosis.  Murmur does not affect S2. Hypertension: The current medical regimen is effective;  continue present plan and medications. Hyperlipidemia: Lipids well-controlled.  Normal LFTs in February 2023.  Followed by his primary care doctor, Dr. Jeronimo Greaves.  LDL 69, HDL 44.  Whole food, plant-based diet.  Avoid processed foods.  He does eat a lot of deer meat.  He eats a lot  of seafood.  Try to maintain fiber intake as well.  Healthy diet should also help with weight loss in the setting of morbid obesity.   Current medicines are reviewed at length with the patient today.  The  patient concerns regarding his medicines were addressed.  The following changes have been made:  No change  Labs/ tests ordered today include: lipids followed by PCP No orders of the defined types were placed in this encounter.   Recommend 150 minutes/week of aerobic exercise Low fat, low carb, high fiber diet recommended  Disposition:   FU in 1 year   Signed, Larae Grooms, MD  07/25/2022 8:34 AM    [facility] [address], [place], [place]  [address] Phone: [phone]; Fax: [phone]

## 2022-07-24 NOTE — Progress Notes (Signed)
[facility] Note  07/27/2022     CHIEF COMPLAINT Patient presents for Post-op Follow-up   HISTORY OF PRESENT ILLNESS: Anthony Hebert is a 71 y.o. male who presents to the clinic today for:   HPI     Post-op Follow-up   In left eye.  Discomfort includes none.  Vision is stable.  I, the attending physician,  performed the HPI with the patient and updated documentation appropriately.        Comments   Patient feels that the bubble is gone. When reading there is a blind spot in the left eye. He is Pred OS QD. There has been no changes in his general health.       Last edited by Bernarda Caffey, MD on 07/27/2022  6:24 PM.    Pt states his left eye vision is clearer, but if he covers his right eye, he cannot ready anything, pt thinks gas bubble disappeared yesterday   Referring physician: Aura Dials, MD [place],  [place] [address]  HISTORICAL INFORMATION:   Selected notes from the MEDICAL RECORD NUMBER Referred by Dr. Lucita Ferrara for mac hole OS LEE:  Ocular Hx- PMH-    CURRENT MEDICATIONS: Current Outpatient Medications (Ophthalmic Drugs)  Medication Sig   Polyvinyl Alcohol-Povidone (REFRESH OP) Place 1 drop into both eyes daily as needed (dry eyes).   prednisoLONE acetate (PRED FORTE) 1 % ophthalmic suspension Place 1 drop into the left eye 4 (four) times daily.   RESTASIS 0.05 % ophthalmic emulsion Place 1 drop into both eyes 2 (two) times daily.   No current facility-administered medications for this visit. (Ophthalmic Drugs)   Current Outpatient Medications (Other)  Medication Sig   allopurinol (ZYLOPRIM) 100 MG tablet Take 200 mg by mouth daily.   bismuth subsalicylate (PEPTO BISMOL) 262 MG chewable tablet Chew 262 mg by mouth as needed for indigestion.   gabapentin (NEURONTIN) 100 MG capsule Take 100 mg by mouth 3 (three) times daily.   lisinopril (ZESTRIL) 20 MG tablet Take 20 mg by mouth daily.   methocarbamol  (ROBAXIN) 500 MG tablet Take 1 tablet (500 mg total) by mouth every 6 (six) hours as needed for muscle spasms.   oxyCODONE (ROXICODONE) 5 MG immediate release tablet Take 1-2 tablets (5-10 mg total) by mouth every 6 (six) hours as needed for severe pain.   rosuvastatin (CRESTOR) 20 MG tablet Take 1 tablet (20 mg total) by mouth daily.   traMADol (ULTRAM) 50 MG tablet Take 1-2 tablets (50-100 mg total) by mouth every 6 (six) hours as needed for moderate pain.   triamcinolone cream (KENALOG) 0.1 % APPLY TWICE DAILY TO THE AFFECTED AREAS FOR 14 DAYS   No current facility-administered medications for this visit. (Other)   REVIEW OF SYSTEMS: ROS   Positive for: Musculoskeletal, Cardiovascular, Eyes Negative for: Constitutional, Gastrointestinal, Neurological, Skin, Genitourinary, HENT, Endocrine, Respiratory, Psychiatric, Allergic/Imm, Heme/Lymph Last edited by Annie Paras, COT on 07/27/2022  8:34 AM.     ALLERGIES No Known Allergies  PAST MEDICAL HISTORY Past Medical History:  Diagnosis Date   Acute idiopathic gout of right ankle    Aortic valve sclerosis    Arthritis    "all over"   Chronic back pain    "all over"   Complication of anesthesia    slow to wake up at times   DDD (degenerative disc disease), lumbar    Difficult intubation    had trouble with intubation shape  of throat;last sx no problems per pt; 03/28/16 successful oral ETT with green Glidescope, stylet. Has limited oral opening/small glottic opening   Foot swelling    in [place] 07/2013   GERD (gastroesophageal reflux disease)    occ   Gout of left foot 10/2011   in [facility]   Heart murmur    states no problems   History of melanoma    Hypercholesterolemia    Hyperlipidemia    Hypertension    Melanoma ([facility]) ~ 2010   "above left eye"   Nonrheumatic aortic valve stenosis    Other chronic pain    Primary osteoarthritis involving multiple joints    Rising PSA level    Screening for prostate cancer    Stroke  [facility])    "maybe w/my 1st back OR in the 1990's"; denies residual on 07/15/2015   Use of cane as ambulatory aid    straight   Walker as ambulation aid    Past Surgical History:  Procedure Laterality Date   25 GAUGE PARS PLANA VITRECTOMY WITH 20 GAUGE MVR PORT FOR MACULAR HOLE Left 05/25/2022   Procedure: 25 GAUGE PARS PLANA VITRECTOMY WITH 20 GAUGE MVR PORT FOR MACULAR HOLE;  Surgeon: Bernarda Caffey, MD;  Location: [facility];  Service: Ophthalmology;  Laterality: Left;   APPENDECTOMY     BACK SURGERY     CARPAL TUNNEL RELEASE Right 07/15/2015   w/GUYON'S CANAL RELEASE    CARPAL TUNNEL RELEASE Right 07/15/2015   Procedure: RIGHT FOREARM AND WRIST CARPAL TUNNEL RELEASE;  Surgeon: Roseanne Kaufman, MD;  Location: [facility];  Service: Orthopedics;  Laterality: Right;   CARPAL TUNNEL RELEASE Left 03/22/2017   Procedure: OPEN CARPAL TUNNEL RELEASE;  Surgeon: Roseanne Kaufman, MD;  Location: [facility];  Service: Orthopedics;  Laterality: Left;  60 mins   COLONOSCOPY     COLONOSCOPY WITH PROPOFOL N/A 01/05/2022   Procedure: COLONOSCOPY WITH PROPOFOL;  Surgeon: Milus Banister, MD;  Location: [facility];  Service: Gastroenterology;  Laterality: N/A;   FOOT SURGERY Right    cut nerve to relieve pain   GAS INSERTION Left 05/25/2022   Procedure: INSERTION OF GAS;  Surgeon: Bernarda Caffey, MD;  Location: [facility];  Service: Ophthalmology;  Laterality: Left;   HEMOSTASIS CLIP PLACEMENT  01/05/2022   Procedure: HEMOSTASIS CLIP PLACEMENT;  Surgeon: Milus Banister, MD;  Location: [facility];  Service: Gastroenterology;;   INGUINAL HERNIA REPAIR Left [place] N/A 03/28/2016   Procedure: INSERTION OF MESH;  Surgeon: Rolm Bookbinder, MD;  Location: [facility];  Service: General;  Laterality: N/A;   MELANOMA EXCISION     MEMBRANE PEEL Left 05/25/2022   Procedure: MEMBRANE PEEL;  Surgeon: Bernarda Caffey, MD;  Location: [facility];  Service: Ophthalmology;  Laterality: Left;   POLYPECTOMY  01/05/2022   Procedure:  POLYPECTOMY;  Surgeon: Milus Banister, MD;  Location: [facility];  Service: Gastroenterology;;   POSTERIOR CERVICAL LAMINECTOMY Right 10/07/2013   Procedure: POSTERIOR CERVICAL LAMINECTOMY RIGHT CERVICAL SEVEN -THORACIC ONE FORAMINOTOMY;  Surgeon: Faythe Ghee, MD;  Location: [facility];  Service: Neurosurgery;  Laterality: Right;   POSTERIOR LUMBAR FUSION  X 7   SPINAL CORD STIMULATOR IMPLANT  ~ 2012   SPINAL CORD STIMULATOR REMOVAL N/A 09/19/2012   Procedure: LUMBAR SPINAL CORD STIMULATOR REMOVAL;  Surgeon: Faythe Ghee, MD;  Location: [facility];  Service: Neurosurgery;  Laterality: N/A;   TONSILLECTOMY     TOTAL HIP ARTHROPLASTY Right 05/01/2022   Procedure:  TOTAL HIP ARTHROPLASTY ANTERIOR APPROACH;  Surgeon: Gaynelle Arabian, MD;  Location: [facility];  Service: Orthopedics;  Laterality: Right;   ULNAR NERVE TRANSPOSITION Right 07/15/2015   supercharged AIN to deep motor branch ulnar nerve nerve transfer   UMBILICAL HERNIA REPAIR N/A 03/28/2016   Procedure: LAPAROSCOPIC UMBILICAL HERNIA;  Surgeon: Rolm Bookbinder, MD;  [facility];  Service: General;  Laterality: N/A;   FAMILY HISTORY Family History  Problem Relation Age of Onset   Cancer Mother    Alzheimer's disease Father    Colon cancer Neg Hx    Colon polyps Neg Hx    Esophageal cancer Neg Hx    Stomach cancer Neg Hx    Rectal cancer Neg Hx    SOCIAL HISTORY Social History   Tobacco Use   Smoking status: Never   Smokeless tobacco: Never  Vaping Use   Vaping Use: Never used  Substance Use Topics   Alcohol use: Yes    Alcohol/week: 1.0 - 2.0 standard drink of alcohol    Types: 1 - 2 Cans of beer per week    Comment: socially   Drug use: No       OPHTHALMIC EXAM:  Base Eye Exam     Visual Acuity (Snellen - Linear)       Right Left   Dist [place] 20/25 CF at 5'   Dist ph   NI         Tonometry (Tonopen, 8:37 AM)       Right Left   Pressure 20 22         Pupils       Dark Light Shape React  APD   Right 3 2 Round Brisk None   Left 3 2 Round Minimal None         Visual Fields       Left Right     Full   Restrictions Partial inner superior temporal, inferior temporal, superior nasal, inferior nasal deficiencies          Extraocular Movement       Right Left    Full, Ortho Full, Ortho         Neuro/Psych     Oriented x3: Yes   Mood/Affect: Normal         Dilation     Both eyes: 1.0% Mydriacyl, 2.5% Phenylephrine @ 8:34 AM           Slit Lamp and Fundus Exam     Slit Lamp Exam       Right Left   Lids/Lashes Dermatochalasis - upper lid Dermatochalasis - upper lid   Conjunctiva/Sclera White and quiet nasal and temporal pinguecula   Cornea mild arcus, tear film debris, well healed cataract wound arcus, well healed cataract wound, trace tear film debris   Anterior Chamber deep and clear deep and clear   Iris Round and dilated Round and dilated   Lens PC IOL in good position PC IOL in good position, small PC opening   Anterior Vitreous mild syneresis post vitrectomy, mild cell/pigment, gas bubble gone         Fundus Exam       Right Left   Disc mild Pallor, Sharp rim 3+temporal Pallor, Sharp rim, PPA, focal PPP   C/D Ratio 0.2 0.3   Macula Flat, Good foveal reflex, RPE mottling, No heme or edema Flat, Blunted foveal reflex, mac hole closed, mild RPE mottling, No heme or edema   Vessels mild attenuation, mild tortuosity attenuated,  mild tortuosity   Periphery Attached Attached, good 360 peripheral laser changes           IMAGING AND PROCEDURES  Imaging and Procedures for 07/27/2022  OCT, Retina - OU - Both Eyes       Right Eye Quality was good. Central Foveal Thickness: 270. Progression has been stable. Findings include normal foveal contour, no IRF, no SRF.   Left Eye Quality was good. Central Foveal Thickness: 302. Progression has been stable. Findings include no IRF, no SRF, abnormal foveal contour, outer retinal atrophy (Mac hole  closed; trace cystic changes centrally, mild decrease in ellipsoid signal centrally; tr vit opacities).   Notes *Images captured and stored on drive  Diagnosis / Impression:  OD: NFP, no IRF/SRF OS: Mac hole closed; trace cystic changes centrally, mild decrease in ellipsoid signal centrally; tr vit opacities  Clinical management:  See below  Abbreviations: NFP - Normal foveal profile. CME - cystoid macular edema. PED - pigment epithelial detachment. IRF - intraretinal fluid. SRF - subretinal fluid. EZ - ellipsoid zone. ERM - epiretinal membrane. ORA - outer retinal atrophy. ORT - outer retinal tubulation. SRHM - subretinal hyper-reflective material. IRHM - intraretinal hyper-reflective material      Humphrey Visual Field - OU - Both Eyes       Right Eye Reliability was good. Progression has no prior data. Findings include non-specific defects.   Left Eye Reliability was good. Progression has no prior data. Findings include inferior altitudinal defect, superior paracentral defect.   Notes HVF 30-2 OU SITA-STD  OD Fixation losses: 1/18 False positive errors: 0% False negative errors: 2% Findings: focal defects superior paracentral  OS Fixation losses: 3/21 False positive errors: 0% False negative errors: 7% Findings: inferior altitudinal scotoma             ASSESSMENT/PLAN:    ICD-10-CM   1. NAION (non-arteritic anterior ischemic optic neuropathy), left  H47.012 Humphrey Visual Field - OU - Both Eyes    2. Macular hole of left eye  H35.342 OCT, Retina - OU - Both Eyes    3. Essential hypertension  I10     4. Hypertensive retinopathy of both eyes  H35.033     5. Pseudophakia, both eyes  Z96.1      Macular hole, left eye   - pre op OCT shows stable, thin FTMH, mild interval improvement in surrounding cystic changes  - pre op BCVA 20/70 - s/p PPV/TissueBlue/MP/14% C3F8 OS, 10.12.23             - mac hole closed nicely, retina attached  - gas bubble  gone  - BCVA: CF_0 ' -- post-op NAION             - IOP slightly elevated at 22             - okay to stop PredForte - PSO ung PRN              - d/c all post-op restrictions  - f/u 2-3 months, DFE, OCT  2. NAION OS  - decreased vision out of proportion with post-op course -- mac hole closed nicely and gas bubble not impeding central macula  - pt denies temporal pain, jaw claudication, headache; ROS for GCA negative  - exam shows pale disc  - suspect NAION OS  - HVF 30-2 on 12.14.23 shows inferior altitudinal scotoma  - recommend restarting ASA  - recommend appt with PCP and possible sleep study  3,4. Hypertensive retinopathy OU -  discussed importance of tight BP control - monitor  5. Pseudophakia OU  - s/p CE/IOL OU (Dr. Talbert Forest)  - IOLs in good position, doing well  - monitor  Ophthalmic Meds Ordered this visit:  No orders of the defined types were placed in this encounter.    Return for f/u 2-3 months, FTMH OS, DFE, OCT.  There are no Patient Instructions on file for this visit.   Explained the diagnoses, plan, and follow up with the patient and they expressed understanding.  Patient expressed understanding of the importance of proper follow up care.   This document serves as a record of services personally performed by Gardiner Sleeper, MD, PhD. It was created on their behalf by San Jetty. Owens Shark, OA an ophthalmic technician. The creation of this record is the provider's dictation and/or activities during the visit.    Electronically signed by: San Jetty. Owens Shark, [place] 12.11.2023 6:24 PM  Gardiner Sleeper, M.D., Ph.D. Diseases & Surgery of the Retina and Vitreous [facility]  I have reviewed the above documentation for accuracy and completeness, and I agree with the above. Gardiner Sleeper, M.D., Ph.D. 07/27/22 6:32 PM   Abbreviations: M myopia (nearsighted); A astigmatism; H hyperopia (farsighted); P presbyopia; Mrx spectacle prescription;  CTL contact  lenses; OD right eye; OS left eye; OU both eyes  XT exotropia; ET esotropia; PEK punctate epithelial keratitis; PEE punctate epithelial erosions; DES dry eye syndrome; MGD meibomian gland dysfunction; ATs artificial tears; PFAT's preservative free artificial tears; [place] nuclear sclerotic cataract; PSC posterior subcapsular cataract; ERM epi-retinal membrane; PVD posterior vitreous detachment; RD retinal detachment; DM diabetes mellitus; DR diabetic retinopathy; NPDR non-proliferative diabetic retinopathy; PDR proliferative diabetic retinopathy; CSME clinically significant macular edema; DME diabetic macular edema; dbh dot blot hemorrhages; CWS cotton wool spot; POAG primary open angle glaucoma; C/D cup-to-disc ratio; HVF humphrey visual field; GVF goldmann visual field; OCT optical coherence tomography; IOP intraocular pressure; BRVO Branch retinal vein occlusion; CRVO central retinal vein occlusion; CRAO central retinal artery occlusion; BRAO branch retinal artery occlusion; RT retinal tear; SB scleral buckle; PPV pars plana vitrectomy; VH Vitreous hemorrhage; PRP panretinal laser photocoagulation; IVK intravitreal kenalog; VMT vitreomacular traction; MH Macular hole;  NVD neovascularization of the disc; NVE neovascularization elsewhere; AREDS age related eye disease study; ARMD age related macular degeneration; POAG primary open angle glaucoma; EBMD epithelial/anterior basement membrane dystrophy; ACIOL anterior chamber intraocular lens; IOL intraocular lens; PCIOL posterior chamber intraocular lens; Phaco/IOL phacoemulsification with intraocular lens placement; [place] photorefractive keratectomy; LASIK laser assisted in situ keratomileusis; HTN hypertension; DM diabetes mellitus; COPD chronic obstructive pulmonary disease

## 2022-07-25 ENCOUNTER — Ambulatory Visit: Payer: Medicare HMO | Attending: Interventional Cardiology | Admitting: Interventional Cardiology

## 2022-07-25 ENCOUNTER — Encounter: Payer: Self-pay | Admitting: Interventional Cardiology

## 2022-07-25 VITALS — BP 126/74 | HR 70 | Ht 65.0 in | Wt 211.0 lb

## 2022-07-25 DIAGNOSIS — I10 Essential (primary) hypertension: Secondary | ICD-10-CM | POA: Diagnosis not present

## 2022-07-25 DIAGNOSIS — I350 Nonrheumatic aortic (valve) stenosis: Secondary | ICD-10-CM

## 2022-07-25 DIAGNOSIS — E782 Mixed hyperlipidemia: Secondary | ICD-10-CM

## 2022-07-25 MED ORDER — ROSUVASTATIN CALCIUM 20 MG PO TABS: 20.0000 mg | ORAL_TABLET | Freq: Every day | ORAL | 3 refills | Status: DC

## 2022-07-25 NOTE — Patient Instructions (Signed)
Medication Instructions:  Your physician recommends that you continue on your current medications as directed. Please refer to the Current Medication list given to you today.  *If you need a refill on your cardiac medications before your next appointment, please call your pharmacy*   Lab Work: none If you have labs (blood work) drawn today and your tests are completely normal, you will receive your results only by: [place] (if you have MyChart) OR A paper copy in the mail If you have any lab test that is abnormal or we need to change your treatment, we will call you to review the results.   Testing/Procedures: none   Follow-Up: At [facility], you and your health needs are our priority.  As part of our continuing mission to provide you with exceptional heart care, we have created designated Provider Care Teams.  These Care Teams include your primary Cardiologist (physician) and Advanced Practice Providers (APPs -  Physician Assistants and Nurse Practitioners) who all work together to provide you with the care you need, when you need it.  We recommend signing up for the patient portal called "MyChart".  Sign up information is provided on this After Visit Summary.  MyChart is used to connect with patients for Virtual Visits (Telemedicine).  Patients are able to view lab/test results, encounter notes, upcoming appointments, etc.  Non-urgent messages can be sent to your provider as well.   To learn more about what you can do with MyChart, go to [email or web address].    Your next appointment:   12 month(s)  The format for your next appointment:   In Person  Provider:   Dr Irish Lack    Other [place]

## 2022-07-27 ENCOUNTER — Encounter (INDEPENDENT_AMBULATORY_CARE_PROVIDER_SITE_OTHER): Payer: Self-pay | Admitting: Ophthalmology

## 2022-07-27 ENCOUNTER — Ambulatory Visit (INDEPENDENT_AMBULATORY_CARE_PROVIDER_SITE_OTHER): Payer: Medicare HMO | Admitting: Ophthalmology

## 2022-07-27 DIAGNOSIS — H47012 Ischemic optic neuropathy, left eye: Secondary | ICD-10-CM

## 2022-07-27 DIAGNOSIS — I10 Essential (primary) hypertension: Secondary | ICD-10-CM

## 2022-07-27 DIAGNOSIS — H35033 Hypertensive retinopathy, bilateral: Secondary | ICD-10-CM | POA: Diagnosis not present

## 2022-07-27 DIAGNOSIS — Z961 Presence of intraocular lens: Secondary | ICD-10-CM

## 2022-07-27 DIAGNOSIS — H35342 Macular cyst, hole, or pseudohole, left eye: Secondary | ICD-10-CM

## 2022-07-31 DIAGNOSIS — H524 Presbyopia: Secondary | ICD-10-CM | POA: Diagnosis not present

## 2022-09-11 DIAGNOSIS — S52501D Unspecified fracture of the lower end of right radius, subsequent encounter for closed fracture with routine healing: Secondary | ICD-10-CM | POA: Diagnosis not present

## 2022-09-11 DIAGNOSIS — S63591D Other specified sprain of right wrist, subsequent encounter: Secondary | ICD-10-CM | POA: Diagnosis not present

## 2022-09-11 DIAGNOSIS — S63592D Other specified sprain of left wrist, subsequent encounter: Secondary | ICD-10-CM | POA: Diagnosis not present

## 2022-09-14 DIAGNOSIS — L821 Other seborrheic keratosis: Secondary | ICD-10-CM | POA: Diagnosis not present

## 2022-09-14 DIAGNOSIS — D485 Neoplasm of uncertain behavior of skin: Secondary | ICD-10-CM | POA: Diagnosis not present

## 2022-09-14 DIAGNOSIS — L814 Other melanin hyperpigmentation: Secondary | ICD-10-CM | POA: Diagnosis not present

## 2022-09-14 DIAGNOSIS — Z85820 Personal history of malignant melanoma of skin: Secondary | ICD-10-CM | POA: Diagnosis not present

## 2022-09-14 DIAGNOSIS — D225 Melanocytic nevi of trunk: Secondary | ICD-10-CM | POA: Diagnosis not present

## 2022-09-20 NOTE — Progress Notes (Signed)
[facility] Note  09/27/2022     CHIEF COMPLAINT Patient presents for Retina Follow Up   HISTORY OF PRESENT ILLNESS: Anthony Hebert is a 72 y.o. male who presents to the clinic today for:   HPI     Retina Follow Up   Patient presents with  Other.  In left eye.  Since onset it is stable.  I, the attending physician,  performed the HPI with the patient and updated documentation appropriately.        Comments   8 week exu eval - pt states vision the same OU      Last edited by Bernarda Caffey, MD on 09/27/2022  1:38 PM.    Pt feels like his field of vision has improved, but if he tries to focus on something it goes away and turns black  Referring physician: Aura Dials, MD [place],  [place] [address]  HISTORICAL INFORMATION:   Selected notes from the MEDICAL RECORD NUMBER Referred by Dr. Lucita Ferrara for mac hole OS LEE:  Ocular Hx- PMH-    CURRENT MEDICATIONS: Current Outpatient Medications (Ophthalmic Drugs)  Medication Sig   Polyvinyl Alcohol-Povidone (REFRESH OP) Place 1 drop into both eyes daily as needed (dry eyes).   prednisoLONE acetate (PRED FORTE) 1 % ophthalmic suspension Place 1 drop into the left eye 4 (four) times daily.   RESTASIS 0.05 % ophthalmic emulsion Place 1 drop into both eyes 2 (two) times daily.   No current facility-administered medications for this visit. (Ophthalmic Drugs)   Current Outpatient Medications (Other)  Medication Sig   allopurinol (ZYLOPRIM) 100 MG tablet Take 200 mg by mouth daily.   bismuth subsalicylate (PEPTO BISMOL) 262 MG chewable tablet Chew 262 mg by mouth as needed for indigestion.   gabapentin (NEURONTIN) 100 MG capsule Take 100 mg by mouth 3 (three) times daily.   lisinopril (ZESTRIL) 20 MG tablet Take 20 mg by mouth daily.   methocarbamol (ROBAXIN) 500 MG tablet Take 1 tablet (500 mg total) by mouth every 6 (six) hours as needed for muscle spasms.   oxyCODONE  (ROXICODONE) 5 MG immediate release tablet Take 1-2 tablets (5-10 mg total) by mouth every 6 (six) hours as needed for severe pain.   rosuvastatin (CRESTOR) 20 MG tablet Take 1 tablet (20 mg total) by mouth daily.   traMADol (ULTRAM) 50 MG tablet Take 1-2 tablets (50-100 mg total) by mouth every 6 (six) hours as needed for moderate pain.   triamcinolone cream (KENALOG) 0.1 % APPLY TWICE DAILY TO THE AFFECTED AREAS FOR 14 DAYS   No current facility-administered medications for this visit. (Other)   REVIEW OF SYSTEMS: ROS   Positive for: Eyes Last edited by Bernarda Caffey, MD on 09/27/2022  1:38 PM.     ALLERGIES No Known Allergies  PAST MEDICAL HISTORY Past Medical History:  Diagnosis Date   Acute idiopathic gout of right ankle    Aortic valve sclerosis    Arthritis    "all over"   Chronic back pain    "all over"   Complication of anesthesia    slow to wake up at times   DDD (degenerative disc disease), lumbar    Difficult intubation    had trouble with intubation shape of throat;last sx no problems per pt; 03/28/16 successful oral ETT with green Glidescope, stylet. Has limited oral opening/small glottic opening   Foot swelling    in [place] 07/2013   GERD (gastroesophageal  reflux disease)    occ   Gout of left foot 10/2011   in [place]   Heart murmur    states no problems   History of melanoma    Hypercholesterolemia    Hyperlipidemia    Hypertension    Melanoma ([place]) ~ 2010   "above left eye"   Nonrheumatic aortic valve stenosis    Other chronic pain    Primary osteoarthritis involving multiple joints    Rising PSA level    Screening for prostate cancer    Stroke [facility])    "maybe w/my 1st back OR in the 1990's"; denies residual on 07/15/2015   Use of cane as ambulatory aid    straight   Walker as ambulation aid    Past Surgical History:  Procedure Laterality Date   25 GAUGE PARS PLANA VITRECTOMY WITH 20 GAUGE MVR PORT FOR MACULAR HOLE Left 05/25/2022   Procedure: 25 GAUGE  PARS PLANA VITRECTOMY WITH 20 GAUGE MVR PORT FOR MACULAR HOLE;  Surgeon: Bernarda Caffey, MD;  Location: [place];  Service: Ophthalmology;  Laterality: Left;   APPENDECTOMY     BACK SURGERY     CARPAL TUNNEL RELEASE Right 07/15/2015   w/GUYON'S CANAL RELEASE    CARPAL TUNNEL RELEASE Right 07/15/2015   Procedure: RIGHT FOREARM AND WRIST CARPAL TUNNEL RELEASE;  Surgeon: Roseanne Kaufman, MD;  Location: [place];  Service: Orthopedics;  Laterality: Right;   CARPAL TUNNEL RELEASE Left 03/22/2017   Procedure: OPEN CARPAL TUNNEL RELEASE;  Surgeon: Roseanne Kaufman, MD;  Location: [place];  Service: Orthopedics;  Laterality: Left;  60 mins   COLONOSCOPY     COLONOSCOPY WITH PROPOFOL N/A 01/05/2022   Procedure: COLONOSCOPY WITH PROPOFOL;  Surgeon: Milus Banister, MD;  Location: [facility];  Service: Gastroenterology;  Laterality: N/A;   FOOT SURGERY Right    cut nerve to relieve pain   GAS INSERTION Left 05/25/2022   Procedure: INSERTION OF GAS;  Surgeon: Bernarda Caffey, MD;  Location: [place];  Service: Ophthalmology;  Laterality: Left;   HEMOSTASIS CLIP PLACEMENT  01/05/2022   Procedure: HEMOSTASIS CLIP PLACEMENT;  Surgeon: Milus Banister, MD;  Location: [facility];  Service: Gastroenterology;;   INGUINAL HERNIA REPAIR Left [place] N/A 03/28/2016   Procedure: INSERTION OF MESH;  Surgeon: Rolm Bookbinder, MD;  Location: [place];  Service: General;  Laterality: N/A;   MELANOMA EXCISION     MEMBRANE PEEL Left 05/25/2022   Procedure: MEMBRANE PEEL;  Surgeon: Bernarda Caffey, MD;  Location: [place];  Service: Ophthalmology;  Laterality: Left;   POLYPECTOMY  01/05/2022   Procedure: POLYPECTOMY;  Surgeon: Milus Banister, MD;  Location: [facility];  Service: Gastroenterology;;   POSTERIOR CERVICAL LAMINECTOMY Right 10/07/2013   Procedure: POSTERIOR CERVICAL LAMINECTOMY RIGHT CERVICAL SEVEN -THORACIC ONE FORAMINOTOMY;  Surgeon: Faythe Ghee, MD;  Location: [place] [facility];  Service: Neurosurgery;   Laterality: Right;   POSTERIOR LUMBAR FUSION  X 7   SPINAL CORD STIMULATOR IMPLANT  ~ 2012   SPINAL CORD STIMULATOR REMOVAL N/A 09/19/2012   Procedure: LUMBAR SPINAL CORD STIMULATOR REMOVAL;  Surgeon: Faythe Ghee, MD;  Location: [place] [facility];  Service: Neurosurgery;  Laterality: N/A;   TONSILLECTOMY     TOTAL HIP ARTHROPLASTY Right 05/01/2022   Procedure: TOTAL HIP ARTHROPLASTY ANTERIOR APPROACH;  Surgeon: Gaynelle Arabian, MD;  Location: [facility];  Service: Orthopedics;  Laterality: Right;   ULNAR NERVE TRANSPOSITION Right 07/15/2015   supercharged AIN to deep motor branch  ulnar nerve nerve transfer   UMBILICAL HERNIA REPAIR N/A 03/28/2016   Procedure: LAPAROSCOPIC UMBILICAL HERNIA;  Surgeon: Rolm Bookbinder, MD;  Location: [facility];  Service: General;  Laterality: N/A;   FAMILY HISTORY Family History  Problem Relation Age of Onset   Cancer Mother    Alzheimer's disease Father    Colon cancer Neg Hx    Colon polyps Neg Hx    Esophageal cancer Neg Hx    Stomach cancer Neg Hx    Rectal cancer Neg Hx    SOCIAL HISTORY Social History   Tobacco Use   Smoking status: Never   Smokeless tobacco: Never  Vaping Use   Vaping Use: Never used  Substance Use Topics   Alcohol use: Yes    Alcohol/week: 1.0 - 2.0 standard drink of alcohol    Types: 1 - 2 Cans of beer per week    Comment: socially   Drug use: No       OPHTHALMIC EXAM:  Base Eye Exam     Visual Acuity (Snellen - Linear)       Right Left   Dist [place] 20/30 -1 20/400 -1   Dist ph  20/20 -2 NI         Tonometry (Tonopen, 8:32 AM)       Right Left   Pressure 17 15         Pupils       Dark Light Shape React APD   Right 3 2 Round Slow None   Left 3 2 Round slow` None         Visual Fields (Counting fingers)       Left Right    Full Full         Extraocular Movement       Right Left    Full, Ortho Full, Ortho         Neuro/Psych     Oriented x3: Yes   Mood/Affect: Normal          Dilation     Right eye:            Slit Lamp and Fundus Exam     Slit Lamp Exam       Right Left   Lids/Lashes Dermatochalasis - upper lid Dermatochalasis - upper lid   Conjunctiva/Sclera White and quiet nasal and temporal pinguecula   Cornea mild arcus, tear film debris, well healed cataract wound, trace PEE arcus, well healed cataract wound, trace tear film debris   Anterior Chamber deep and clear deep and clear   Iris Round and dilated Round and dilated   Lens PC IOL in good position PC IOL in good position, small PC opening   Anterior Vitreous mild syneresis, Posterior vitreous detachment post vitrectomy, mild cell/pigment, gas bubble gone         Fundus Exam       Right Left   Disc mild Pallor, Sharp rim, mild PPP 3+temporal Pallor, Sharp rim, PPA, focal PPP   C/D Ratio 0.2 0.3   Macula Flat, Good foveal reflex, mild RPE mottling, No heme or edema Flat, Blunted foveal reflex, mac hole closed, mild RPE mottling, No heme or edema   Vessels mild attenuation, mild tortuosity attenuated, mild tortuosity   Periphery Attached Attached, good 360 peripheral laser changes           IMAGING AND PROCEDURES  Imaging and Procedures for 09/27/2022          ASSESSMENT/PLAN:    ICD-10-CM  1. Macular hole of left eye  H35.342     2. NAION (non-arteritic anterior ischemic optic neuropathy), left  H47.012     3. Essential hypertension  I10     4. Hypertensive retinopathy of both eyes  H35.033     5. Pseudophakia, both eyes  Z96.1      Macular hole, left eye   - pre op OCT shows stable, thin FTMH, mild interval improvement in surrounding cystic changes  - pre op BCVA 20/70 - s/p PPV/TissueBlue/MP/14% C3F8 OS, 10.12.23             - mac hole closed nicely, retina attached  - gas bubble gone  - BCVA: 20/400 -- post-op NAION             - IOP okay at 15  - f/u 6-9 months, DFE, OCT  2. NAION OS  - decreased vision out of proportion with post-op course -- mac hole  closed nicely and gas bubble not impeding central macula  - pt denies temporal pain, jaw claudication, headache; ROS for GCA negative  - exam shows pale disc  - HVF 30-2 on 12.14.23 shows inferior altitudinal scotoma  - recommend restarting ASA  - recommend appt with PCP and possible sleep study  3,4. Hypertensive retinopathy OU - discussed importance of tight BP control - monitor  5. Pseudophakia OU  - s/p CE/IOL OU (Dr. Talbert Forest)  - IOLs in good position, doing well  - monitor  Ophthalmic Meds Ordered this visit:  No orders of the defined types were placed in this encounter.    Return for f/u 6-9 months, mac hole OS, DFE, OCT.  There are no Patient Instructions on file for this visit.   Explained the diagnoses, plan, and follow up with the patient and they expressed understanding.  Patient expressed understanding of the importance of proper follow up care.   This document serves as a record of services personally performed by Gardiner Sleeper, MD, PhD. It was created on their behalf by Orvan Falconer, an ophthalmic technician. The creation of this record is the provider's dictation and/or activities during the visit.    Electronically signed by: Orvan Falconer, OA, 09/27/22  1:39 PM  This document serves as a record of services personally performed by Gardiner Sleeper, MD, PhD. It was created on their behalf by San Jetty. Owens Shark, OA an ophthalmic technician. The creation of this record is the provider's dictation and/or activities during the visit.    Electronically signed by: San Jetty. Owens Shark, [place] 02.14.2024 1:39 PM  Gardiner Sleeper, M.D., Ph.D. Diseases & Surgery of the Retina and Vitreous [facility]  I have reviewed the above documentation for accuracy and completeness, and I agree with the above. Gardiner Sleeper, M.D., Ph.D. 09/27/22 1:41 PM   Abbreviations: M myopia (nearsighted); A astigmatism; H hyperopia (farsighted); P presbyopia; Mrx spectacle  prescription;  CTL contact lenses; OD right eye; OS left eye; OU both eyes  XT exotropia; ET esotropia; PEK punctate epithelial keratitis; PEE punctate epithelial erosions; DES dry eye syndrome; MGD meibomian gland dysfunction; ATs artificial tears; PFAT's preservative free artificial tears; [place] nuclear sclerotic cataract; PSC posterior subcapsular cataract; ERM epi-retinal membrane; PVD posterior vitreous detachment; RD retinal detachment; DM diabetes mellitus; DR diabetic retinopathy; NPDR non-proliferative diabetic retinopathy; PDR proliferative diabetic retinopathy; CSME clinically significant macular edema; DME diabetic macular edema; dbh dot blot hemorrhages; CWS cotton wool spot; POAG primary open angle glaucoma; C/D cup-to-disc ratio; HVF  humphrey visual field; GVF goldmann visual field; OCT optical coherence tomography; IOP intraocular pressure; BRVO Branch retinal vein occlusion; CRVO central retinal vein occlusion; CRAO central retinal artery occlusion; BRAO branch retinal artery occlusion; RT retinal tear; SB scleral buckle; PPV pars plana vitrectomy; VH Vitreous hemorrhage; PRP panretinal laser photocoagulation; IVK intravitreal kenalog; VMT vitreomacular traction; MH Macular hole;  NVD neovascularization of the disc; NVE neovascularization elsewhere; AREDS age related eye disease study; ARMD age related macular degeneration; POAG primary open angle glaucoma; EBMD epithelial/anterior basement membrane dystrophy; ACIOL anterior chamber intraocular lens; IOL intraocular lens; PCIOL posterior chamber intraocular lens; Phaco/IOL phacoemulsification with intraocular lens placement; [place] photorefractive keratectomy; LASIK laser assisted in situ keratomileusis; HTN hypertension; DM diabetes mellitus; COPD chronic obstructive pulmonary disease

## 2022-09-27 ENCOUNTER — Ambulatory Visit (INDEPENDENT_AMBULATORY_CARE_PROVIDER_SITE_OTHER): Payer: Medicare HMO | Admitting: Ophthalmology

## 2022-09-27 ENCOUNTER — Encounter (INDEPENDENT_AMBULATORY_CARE_PROVIDER_SITE_OTHER): Payer: Self-pay | Admitting: Ophthalmology

## 2022-09-27 DIAGNOSIS — H47012 Ischemic optic neuropathy, left eye: Secondary | ICD-10-CM | POA: Diagnosis not present

## 2022-09-27 DIAGNOSIS — Z961 Presence of intraocular lens: Secondary | ICD-10-CM

## 2022-09-27 DIAGNOSIS — H35033 Hypertensive retinopathy, bilateral: Secondary | ICD-10-CM | POA: Diagnosis not present

## 2022-09-27 DIAGNOSIS — H35342 Macular cyst, hole, or pseudohole, left eye: Secondary | ICD-10-CM

## 2022-09-27 DIAGNOSIS — I10 Essential (primary) hypertension: Secondary | ICD-10-CM | POA: Diagnosis not present

## 2022-10-18 DIAGNOSIS — Z6833 Body mass index (BMI) 33.0-33.9, adult: Secondary | ICD-10-CM | POA: Diagnosis not present

## 2022-10-18 DIAGNOSIS — S32049A Unspecified fracture of fourth lumbar vertebra, initial encounter for closed fracture: Secondary | ICD-10-CM | POA: Diagnosis not present

## 2022-10-20 DIAGNOSIS — M546 Pain in thoracic spine: Secondary | ICD-10-CM | POA: Diagnosis not present

## 2022-10-20 DIAGNOSIS — S32048A Other fracture of fourth lumbar vertebra, initial encounter for closed fracture: Secondary | ICD-10-CM | POA: Diagnosis not present

## 2022-10-20 DIAGNOSIS — Z96641 Presence of right artificial hip joint: Secondary | ICD-10-CM | POA: Diagnosis not present

## 2022-10-26 DIAGNOSIS — M546 Pain in thoracic spine: Secondary | ICD-10-CM | POA: Diagnosis not present

## 2022-10-26 DIAGNOSIS — Z96641 Presence of right artificial hip joint: Secondary | ICD-10-CM | POA: Diagnosis not present

## 2022-10-26 DIAGNOSIS — S32048A Other fracture of fourth lumbar vertebra, initial encounter for closed fracture: Secondary | ICD-10-CM | POA: Diagnosis not present

## 2022-10-30 DIAGNOSIS — M546 Pain in thoracic spine: Secondary | ICD-10-CM | POA: Diagnosis not present

## 2022-10-30 DIAGNOSIS — S32048A Other fracture of fourth lumbar vertebra, initial encounter for closed fracture: Secondary | ICD-10-CM | POA: Diagnosis not present

## 2022-10-30 DIAGNOSIS — Z96641 Presence of right artificial hip joint: Secondary | ICD-10-CM | POA: Diagnosis not present

## 2022-11-01 DIAGNOSIS — S52501D Unspecified fracture of the lower end of right radius, subsequent encounter for closed fracture with routine healing: Secondary | ICD-10-CM | POA: Diagnosis not present

## 2022-11-01 DIAGNOSIS — S63592D Other specified sprain of left wrist, subsequent encounter: Secondary | ICD-10-CM | POA: Diagnosis not present

## 2022-11-01 DIAGNOSIS — S63591D Other specified sprain of right wrist, subsequent encounter: Secondary | ICD-10-CM | POA: Diagnosis not present

## 2022-11-01 DIAGNOSIS — Z4789 Encounter for other orthopedic aftercare: Secondary | ICD-10-CM | POA: Diagnosis not present

## 2022-11-02 DIAGNOSIS — Z96641 Presence of right artificial hip joint: Secondary | ICD-10-CM | POA: Diagnosis not present

## 2022-11-02 DIAGNOSIS — S32048A Other fracture of fourth lumbar vertebra, initial encounter for closed fracture: Secondary | ICD-10-CM | POA: Diagnosis not present

## 2022-11-02 DIAGNOSIS — M546 Pain in thoracic spine: Secondary | ICD-10-CM | POA: Diagnosis not present

## 2022-11-28 DIAGNOSIS — S32049A Unspecified fracture of fourth lumbar vertebra, initial encounter for closed fracture: Secondary | ICD-10-CM | POA: Diagnosis not present

## 2022-12-01 ENCOUNTER — Other Ambulatory Visit: Payer: Self-pay | Admitting: Neurosurgery

## 2022-12-01 DIAGNOSIS — S32049A Unspecified fracture of fourth lumbar vertebra, initial encounter for closed fracture: Secondary | ICD-10-CM

## 2022-12-06 ENCOUNTER — Other Ambulatory Visit: Payer: Medicare HMO

## 2022-12-06 ENCOUNTER — Ambulatory Visit
Admission: RE | Admit: 2022-12-06 | Discharge: 2022-12-06 | Disposition: A | Discharge status: Home / Self Care | Payer: Medicare HMO | Source: Ambulatory Visit | Attending: Neurosurgery | Admitting: Neurosurgery

## 2022-12-06 DIAGNOSIS — S32049A Unspecified fracture of fourth lumbar vertebra, initial encounter for closed fracture: Secondary | ICD-10-CM | POA: Diagnosis not present

## 2022-12-06 DIAGNOSIS — M545 Low back pain, unspecified: Secondary | ICD-10-CM | POA: Diagnosis not present

## 2022-12-06 DIAGNOSIS — S32040A Wedge compression fracture of fourth lumbar vertebra, initial encounter for closed fracture: Secondary | ICD-10-CM | POA: Diagnosis not present

## 2022-12-20 DIAGNOSIS — S32049A Unspecified fracture of fourth lumbar vertebra, initial encounter for closed fracture: Secondary | ICD-10-CM | POA: Diagnosis not present

## 2023-01-16 DIAGNOSIS — Z85820 Personal history of malignant melanoma of skin: Secondary | ICD-10-CM | POA: Diagnosis not present

## 2023-01-16 DIAGNOSIS — L821 Other seborrheic keratosis: Secondary | ICD-10-CM | POA: Diagnosis not present

## 2023-01-16 DIAGNOSIS — L820 Inflamed seborrheic keratosis: Secondary | ICD-10-CM | POA: Diagnosis not present

## 2023-01-16 DIAGNOSIS — L578 Other skin changes due to chronic exposure to nonionizing radiation: Secondary | ICD-10-CM | POA: Diagnosis not present

## 2023-01-16 DIAGNOSIS — L570 Actinic keratosis: Secondary | ICD-10-CM | POA: Diagnosis not present

## 2023-01-16 DIAGNOSIS — D2271 Melanocytic nevi of right lower limb, including hip: Secondary | ICD-10-CM | POA: Diagnosis not present

## 2023-01-16 DIAGNOSIS — D485 Neoplasm of uncertain behavior of skin: Secondary | ICD-10-CM | POA: Diagnosis not present

## 2023-01-16 DIAGNOSIS — D225 Melanocytic nevi of trunk: Secondary | ICD-10-CM | POA: Diagnosis not present

## 2023-01-16 DIAGNOSIS — L814 Other melanin hyperpigmentation: Secondary | ICD-10-CM | POA: Diagnosis not present

## 2023-01-24 DIAGNOSIS — H47292 Other optic atrophy, left eye: Secondary | ICD-10-CM | POA: Diagnosis not present

## 2023-01-24 DIAGNOSIS — Z961 Presence of intraocular lens: Secondary | ICD-10-CM | POA: Diagnosis not present

## 2023-01-24 DIAGNOSIS — H524 Presbyopia: Secondary | ICD-10-CM | POA: Diagnosis not present

## 2023-03-21 DIAGNOSIS — Z6832 Body mass index (BMI) 32.0-32.9, adult: Secondary | ICD-10-CM | POA: Diagnosis not present

## 2023-03-21 DIAGNOSIS — S32049A Unspecified fracture of fourth lumbar vertebra, initial encounter for closed fracture: Secondary | ICD-10-CM | POA: Diagnosis not present

## 2023-04-06 DIAGNOSIS — Z0000 Encounter for general adult medical examination without abnormal findings: Secondary | ICD-10-CM | POA: Diagnosis not present

## 2023-04-06 DIAGNOSIS — E785 Hyperlipidemia, unspecified: Secondary | ICD-10-CM | POA: Diagnosis not present

## 2023-04-06 DIAGNOSIS — R972 Elevated prostate specific antigen [PSA]: Secondary | ICD-10-CM | POA: Diagnosis not present

## 2023-04-06 DIAGNOSIS — M5136 Other intervertebral disc degeneration, lumbar region: Secondary | ICD-10-CM | POA: Diagnosis not present

## 2023-04-06 DIAGNOSIS — Z23 Encounter for immunization: Secondary | ICD-10-CM | POA: Diagnosis not present

## 2023-04-06 DIAGNOSIS — M150 Primary generalized (osteo)arthritis: Secondary | ICD-10-CM | POA: Diagnosis not present

## 2023-04-06 DIAGNOSIS — I358 Other nonrheumatic aortic valve disorders: Secondary | ICD-10-CM | POA: Diagnosis not present

## 2023-04-06 DIAGNOSIS — M109 Gout, unspecified: Secondary | ICD-10-CM | POA: Diagnosis not present

## 2023-04-06 DIAGNOSIS — I10 Essential (primary) hypertension: Secondary | ICD-10-CM | POA: Diagnosis not present

## 2023-04-19 DIAGNOSIS — Z471 Aftercare following joint replacement surgery: Secondary | ICD-10-CM | POA: Diagnosis not present

## 2023-04-19 DIAGNOSIS — Z96641 Presence of right artificial hip joint: Secondary | ICD-10-CM | POA: Diagnosis not present

## 2023-06-18 ENCOUNTER — Other Ambulatory Visit: Payer: Self-pay | Admitting: Interventional Cardiology

## 2023-06-18 DIAGNOSIS — S63591D Other specified sprain of right wrist, subsequent encounter: Secondary | ICD-10-CM | POA: Diagnosis not present

## 2023-06-18 DIAGNOSIS — M24132 Other articular cartilage disorders, left wrist: Secondary | ICD-10-CM | POA: Diagnosis not present

## 2023-06-18 DIAGNOSIS — S63592D Other specified sprain of left wrist, subsequent encounter: Secondary | ICD-10-CM | POA: Diagnosis not present

## 2023-06-20 ENCOUNTER — Telehealth: Payer: Self-pay | Admitting: Internal Medicine

## 2023-06-20 MED ORDER — ROSUVASTATIN CALCIUM 20 MG PO TABS: 20.0000 mg | ORAL_TABLET | Freq: Every day | ORAL | 0 refills | Status: DC

## 2023-06-20 NOTE — Telephone Encounter (Signed)
*  STAT* If patient is at the pharmacy, call can be transferred to refill team.   1. Which medications need to be refilled? (please list name of each medication and dose if known) rosuvastatin (CRESTOR) 20 MG tablet   2. Which pharmacy/location (including street and city if local pharmacy) is medication to be sent to? [facility] - [place], [place] - [address]   3. Do they need a 30 day or 90 day supply? 90

## 2023-06-27 NOTE — Progress Notes (Signed)
[facility] - Clinic Note  07/03/2023     CHIEF COMPLAINT Patient presents for Retina Follow Up   HISTORY OF PRESENT ILLNESS: Anthony Hebert is a 72 y.o. male who presents to the clinic today for:   HPI     Retina Follow Up   Patient presents with  Other.  In left eye.  This started months ago.  Duration of 9 months.  Since onset it is stable.  I, the attending physician,  performed the HPI with the patient and updated documentation appropriately.        Comments   Patient feels the vision in the right eye may be a little worse and the left eye is stable. He is using Restasis OD BID.       Last edited by Rennis Chris, MD on 07/03/2023  9:30 PM.    Pt states left eye vision has not changed, he states his health is getting better   Referring physician: Raynald Blend, OD [address] [place],  [place] [address]  HISTORICAL INFORMATION:   Selected notes from the MEDICAL RECORD NUMBER Referred by Dr. Delaney Meigs for mac hole OS LEE:  Ocular Hx- PMH-    CURRENT MEDICATIONS: Current Outpatient Medications (Ophthalmic Drugs)  Medication Sig   Polyvinyl Alcohol-Povidone (REFRESH OP) Place 1 drop into both eyes daily as needed (dry eyes).   RESTASIS 0.05 % ophthalmic emulsion Place 1 drop into both eyes 2 (two) times daily.   prednisoLONE acetate (PRED FORTE) 1 % ophthalmic suspension Place 1 drop into the left eye 4 (four) times daily.   No current facility-administered medications for this visit. (Ophthalmic Drugs)   Current Outpatient Medications (Other)  Medication Sig   allopurinol (ZYLOPRIM) 100 MG tablet Take 200 mg by mouth daily.   bismuth subsalicylate (PEPTO BISMOL) 262 MG chewable tablet Chew 262 mg by mouth as needed for indigestion.   gabapentin (NEURONTIN) 100 MG capsule Take 100 mg by mouth 3 (three) times daily.   lisinopril (ZESTRIL) 20 MG tablet Take 20 mg by mouth daily.   methocarbamol (ROBAXIN) 500 MG tablet Take 1 tablet (500  mg total) by mouth every 6 (six) hours as needed for muscle spasms.   rosuvastatin (CRESTOR) 20 MG tablet Take 1 tablet (20 mg total) by mouth daily.   traMADol (ULTRAM) 50 MG tablet Take 1-2 tablets (50-100 mg total) by mouth every 6 (six) hours as needed for moderate pain.   triamcinolone cream (KENALOG) 0.1 % APPLY TWICE DAILY TO THE AFFECTED AREAS FOR 14 DAYS   No current facility-administered medications for this visit. (Other)   REVIEW OF SYSTEMS: ROS   Positive for: Musculoskeletal, Cardiovascular, Eyes Negative for: Constitutional, Gastrointestinal, Neurological, Skin, Genitourinary, HENT, Endocrine, Respiratory, Psychiatric, Allergic/Imm, Heme/Lymph Last edited by Charlette Caffey, COT on 07/03/2023  7:52 AM.      ALLERGIES No Known Allergies  PAST MEDICAL HISTORY Past Medical History:  Diagnosis Date   Acute idiopathic gout of right ankle    Aortic valve sclerosis    Arthritis    "all over"   Chronic back pain    "all over"   Complication of anesthesia    slow to wake up at times   DDD (degenerative disc disease), lumbar    Difficult intubation    had trouble with intubation shape of throat;last sx no problems per pt; 03/28/16 successful oral ETT with green Glidescope, stylet. Has limited oral opening/small glottic opening   Foot swelling  in [place] 07/2013   GERD (gastroesophageal reflux disease)    occ   Gout of left foot 10/2011   in [place]   Heart murmur    states no problems   History of melanoma    Hypercholesterolemia    Hyperlipidemia    Hypertension    Melanoma (HCC) ~ 2010   "above left eye"   Nonrheumatic aortic valve stenosis    Other chronic pain    Primary osteoarthritis involving multiple joints    Rising PSA level    Screening for prostate cancer    Stroke [facility])    "maybe w/my 1st back OR in the 1990's"; denies residual on 07/15/2015   Use of cane as ambulatory aid    straight   Walker as ambulation aid    Past Surgical History:   Procedure Laterality Date   25 GAUGE PARS PLANA VITRECTOMY WITH 20 GAUGE MVR PORT FOR MACULAR HOLE Left 05/25/2022   Procedure: 25 GAUGE PARS PLANA VITRECTOMY WITH 20 GAUGE MVR PORT FOR MACULAR HOLE;  Surgeon: Rennis Chris, MD;  Location: [facility] OR;  Service: Ophthalmology;  Laterality: Left;   APPENDECTOMY     BACK SURGERY     CARPAL TUNNEL RELEASE Right 07/15/2015   w/GUYON'S CANAL RELEASE    CARPAL TUNNEL RELEASE Right 07/15/2015   Procedure: RIGHT FOREARM AND WRIST CARPAL TUNNEL RELEASE;  Surgeon: Dominica Severin, MD;  Location: [facility] OR;  Service: Orthopedics;  Laterality: Right;   CARPAL TUNNEL RELEASE Left 03/22/2017   Procedure: OPEN CARPAL TUNNEL RELEASE;  Surgeon: Dominica Severin, MD;  Location: [facility] OR;  Service: Orthopedics;  Laterality: Left;  60 mins   COLONOSCOPY     COLONOSCOPY WITH PROPOFOL N/A 01/05/2022   Procedure: COLONOSCOPY WITH PROPOFOL;  Surgeon: Rachael Fee, MD;  Location: [facility] ENDOSCOPY;  Service: Gastroenterology;  Laterality: N/A;   FOOT SURGERY Right    cut nerve to relieve pain   GAS INSERTION Left 05/25/2022   Procedure: INSERTION OF GAS;  Surgeon: Rennis Chris, MD;  Location: [facility] OR;  Service: Ophthalmology;  Laterality: Left;   HEMOSTASIS CLIP PLACEMENT  01/05/2022   Procedure: HEMOSTASIS CLIP PLACEMENT;  Surgeon: Rachael Fee, MD;  Location: [facility]COPY;  Service: Gastroenterology;;   INGUINAL HERNIA REPAIR Left 1994   INSERTION OF MESH N/A 03/28/2016   Procedure: INSERTION OF MESH;  Surgeon: Emelia Loron, MD;  Location: [facility] OR;  Service: General;  Laterality: N/A;   MELANOMA EXCISION     MEMBRANE PEEL Left 05/25/2022   Procedure: MEMBRANE PEEL;  Surgeon: Rennis Chris, MD;  Location: [facility] OR;  Service: Ophthalmology;  Laterality: Left;   POLYPECTOMY  01/05/2022   Procedure: POLYPECTOMY;  Surgeon: Rachael Fee, MD;  Location: [facility];  Service: Gastroenterology;;   POSTERIOR CERVICAL LAMINECTOMY Right 10/07/2013   Procedure: POSTERIOR CERVICAL  LAMINECTOMY RIGHT CERVICAL SEVEN -THORACIC ONE FORAMINOTOMY;  Surgeon: Reinaldo Meeker, MD;  Location: [facility] NEURO ORS;  Service: Neurosurgery;  Laterality: Right;   POSTERIOR LUMBAR FUSION  X 7   SPINAL CORD STIMULATOR IMPLANT  ~ 2012   SPINAL CORD STIMULATOR REMOVAL N/A 09/19/2012   Procedure: LUMBAR SPINAL CORD STIMULATOR REMOVAL;  Surgeon: Reinaldo Meeker, MD;  Location: [facility] NEURO ORS;  Service: Neurosurgery;  Laterality: N/A;   TONSILLECTOMY     TOTAL HIP ARTHROPLASTY Right 05/01/2022   Procedure: TOTAL HIP ARTHROPLASTY ANTERIOR APPROACH;  Surgeon: Ollen Gross, MD;  Location: [facility] ORS;  Service: Orthopedics;  Laterality: Right;   ULNAR NERVE TRANSPOSITION Right 07/15/2015  supercharged AIN to deep motor branch ulnar nerve nerve transfer   UMBILICAL HERNIA REPAIR N/A 03/28/2016   Procedure: LAPAROSCOPIC UMBILICAL HERNIA;  Surgeon: Emelia Loron, MD;  Location: [facility] OR;  Service: General;  Laterality: N/A;   FAMILY HISTORY Family History  Problem Relation Age of Onset   Cancer Mother    Alzheimer's disease Father    Colon cancer Neg Hx    Colon polyps Neg Hx    Esophageal cancer Neg Hx    Stomach cancer Neg Hx    Rectal cancer Neg Hx    SOCIAL HISTORY Social History   Tobacco Use   Smoking status: Never   Smokeless tobacco: Never  Vaping Use   Vaping status: Never Used  Substance Use Topics   Alcohol use: Yes    Alcohol/week: 1.0 - 2.0 standard drink of alcohol    Types: 1 - 2 Cans of beer per week    Comment: socially   Drug use: No       OPHTHALMIC EXAM:  Base Eye Exam     Visual Acuity (Snellen - Linear)       Right Left   Dist [place] 20/25 CF at 3'   Dist ph [place] 20/20 NI         Tonometry (Tonopen, 7:55 AM)       Right Left   Pressure 16 16         Pupils       Dark Light Shape React APD   Right 3 2 Round Slow None   Left 3 2 Round Slow None         Visual Fields       Left Right    Full Full         Extraocular Movement       Right Left     Full, Ortho Full, Ortho         Neuro/Psych     Oriented x3: Yes   Mood/Affect: Normal         Dilation     Both eyes: 1.0% Mydriacyl, 2.5% Phenylephrine @ 7:52 AM           Slit Lamp and Fundus Exam     Slit Lamp Exam       Right Left   Lids/Lashes Dermatochalasis - upper lid Dermatochalasis - upper lid   Conjunctiva/Sclera White and quiet nasal and temporal pinguecula   Cornea mild arcus, tear film debris, well healed cataract wound, trace PEE arcus, well healed cataract wound, trace tear film debris   Anterior Chamber deep and clear deep and clear   Iris Round and dilated Round and dilated   Lens PC IOL in good position PC IOL in good position, small PC opening   Anterior Vitreous mild syneresis, Posterior vitreous detachment post vitrectomy, mild cell/pigment, gas bubble gone         Fundus Exam       Right Left   Disc mild Pallor, Sharp rim, mild PPP 3+temporal Pallor, Sharp rim, PPA, focal PPP   C/D Ratio 0.4 0.3   Macula Flat, Good foveal reflex, mild RPE mottling, No heme or edema Flat, Blunted foveal reflex, mac hole closed, mild RPE mottling, No heme or edema   Vessels attenuated, Tortuous attenuated, mild tortuosity   Periphery Attached Attached, good 360 peripheral laser changes, No heme           IMAGING AND PROCEDURES  Imaging and Procedures for 07/03/2023  OCT, Retina - OU -  Both Eyes       Right Eye Quality was good. Central Foveal Thickness: 264. Progression has been stable. Findings include normal foveal contour, no IRF, no SRF.   Left Eye Quality was good. Central Foveal Thickness: 332. Progression has been stable. Findings include no SRF, abnormal foveal contour, intraretinal fluid, outer retinal atrophy (Mac hole closed; fine central cystic changes -- ?schisis, mild decrease in ellipsoid signal centrally; tr vit opacities -- improved).   Notes *Images captured and stored on drive  Diagnosis / Impression:  OD: NFP, no  IRF/SRF OS: Mac hole closed; fine central cystic changes -- ?schisis, mild decrease in ellipsoid signal centrally; tr vit opacities -- improved  Clinical management:  See below  Abbreviations: NFP - Normal foveal profile. CME - cystoid macular edema. PED - pigment epithelial detachment. IRF - intraretinal fluid. SRF - subretinal fluid. EZ - ellipsoid zone. ERM - epiretinal membrane. ORA - outer retinal atrophy. ORT - outer retinal tubulation. SRHM - subretinal hyper-reflective material. IRHM - intraretinal hyper-reflective material            ASSESSMENT/PLAN:    ICD-10-CM   1. Macular hole of left eye  H35.342 OCT, Retina - OU - Both Eyes    2. NAION (non-arteritic anterior ischemic optic neuropathy), left  H47.012     3. Essential hypertension  I10     4. Hypertensive retinopathy of both eyes  H35.033     5. Pseudophakia, both eyes  Z96.1       Macular hole, left eye   - pre op OCT shows stable, thin FTMH, mild interval improvement in surrounding cystic changes  - pre op BCVA 20/70 - s/p PPV/TissueBlue/MP/14% C3F8 OS, 10.12.23             - mac hole closed nicely, retina attached  - gas bubble gone  - BCVA: decreased to CF from 20/400 -- post-op NAION             - IOP okay at 16  - f/u 6-9 months, DFE, OCT  2. NAION OS  - decreased vision out of proportion with post-op course -- mac hole closed nicely and gas bubble not impeding central macula  - pt denies temporal pain, jaw claudication, headache; ROS for GCA negative  - exam shows pale disc  - HVF 30-2 on 12.14.23 shows inferior altitudinal scotoma  - recommend restarting ASA  - recommend appt with PCP and possible sleep study  3,4. Hypertensive retinopathy OU - discussed importance of tight BP control - monitor  5. Pseudophakia OU  - s/p CE/IOL OU (Dr. Vonna Kotyk)  - IOLs in good position, doing well  - monitor  Ophthalmic Meds Ordered this visit:  No orders of the defined types were placed in this  encounter.    Return in about 9 months (around 04/01/2024) for f/u mac hole OS, DFE, OCT.  There are no Patient Instructions on file for this visit.   Explained the diagnoses, plan, and follow up with the patient and they expressed understanding.  Patient expressed understanding of the importance of proper follow up care.   This document serves as a record of services personally performed by Karie Chimera, MD, PhD. It was created on their behalf by Charlette Caffey, COT an ophthalmic technician. The creation of this record is the provider's dictation and/or activities during the visit.    Electronically signed by:  Charlette Caffey, COT  07/03/23 9:31 PM  This document serves as a record  of services personally performed by Karie Chimera, MD, PhD. It was created on their behalf by Glee Arvin. Manson Passey, OA an ophthalmic technician. The creation of this record is the provider's dictation and/or activities during the visit.    Electronically signed by: Glee Arvin. Manson Passey, OA 07/03/23 9:31 PM  Karie Chimera, M.D., Ph.D. Diseases & Surgery of the Retina and Vitreous [facility]  I have reviewed the above documentation for accuracy and completeness, and I agree with the above. Karie Chimera, M.D., Ph.D. 07/03/23 9:32 PM   Abbreviations: M myopia (nearsighted); A astigmatism; H hyperopia (farsighted); P presbyopia; Mrx spectacle prescription;  CTL contact lenses; OD right eye; OS left eye; OU both eyes  XT exotropia; ET esotropia; PEK punctate epithelial keratitis; PEE punctate epithelial erosions; DES dry eye syndrome; MGD meibomian gland dysfunction; ATs artificial tears; PFAT's preservative free artificial tears; NSC nuclear sclerotic cataract; PSC posterior subcapsular cataract; ERM epi-retinal membrane; PVD posterior vitreous detachment; RD retinal detachment; DM diabetes mellitus; DR diabetic retinopathy; NPDR non-proliferative diabetic retinopathy; PDR  proliferative diabetic retinopathy; CSME clinically significant macular edema; DME diabetic macular edema; dbh dot blot hemorrhages; CWS cotton wool spot; POAG primary open angle glaucoma; C/D cup-to-disc ratio; HVF humphrey visual field; GVF goldmann visual field; OCT optical coherence tomography; IOP intraocular pressure; BRVO Branch retinal vein occlusion; CRVO central retinal vein occlusion; CRAO central retinal artery occlusion; BRAO branch retinal artery occlusion; RT retinal tear; SB scleral buckle; PPV pars plana vitrectomy; VH Vitreous hemorrhage; PRP panretinal laser photocoagulation; IVK intravitreal kenalog; VMT vitreomacular traction; MH Macular hole;  NVD neovascularization of the disc; NVE neovascularization elsewhere; AREDS age related eye disease study; ARMD age related macular degeneration; POAG primary open angle glaucoma; EBMD epithelial/anterior basement membrane dystrophy; ACIOL anterior chamber intraocular lens; IOL intraocular lens; PCIOL posterior chamber intraocular lens; Phaco/IOL phacoemulsification with intraocular lens placement; PRK photorefractive keratectomy; LASIK laser assisted in situ keratomileusis; HTN hypertension; DM diabetes mellitus; COPD chronic obstructive pulmonary disease

## 2023-06-28 ENCOUNTER — Encounter (INDEPENDENT_AMBULATORY_CARE_PROVIDER_SITE_OTHER): Payer: Medicare HMO | Admitting: Ophthalmology

## 2023-06-28 DIAGNOSIS — I10 Essential (primary) hypertension: Secondary | ICD-10-CM

## 2023-06-28 DIAGNOSIS — H35033 Hypertensive retinopathy, bilateral: Secondary | ICD-10-CM

## 2023-06-28 DIAGNOSIS — H47012 Ischemic optic neuropathy, left eye: Secondary | ICD-10-CM

## 2023-06-28 DIAGNOSIS — H35342 Macular cyst, hole, or pseudohole, left eye: Secondary | ICD-10-CM

## 2023-06-28 DIAGNOSIS — J069 Acute upper respiratory infection, unspecified: Secondary | ICD-10-CM | POA: Diagnosis not present

## 2023-06-28 DIAGNOSIS — Z961 Presence of intraocular lens: Secondary | ICD-10-CM

## 2023-07-03 ENCOUNTER — Encounter (INDEPENDENT_AMBULATORY_CARE_PROVIDER_SITE_OTHER): Payer: Self-pay | Admitting: Ophthalmology

## 2023-07-03 ENCOUNTER — Ambulatory Visit (INDEPENDENT_AMBULATORY_CARE_PROVIDER_SITE_OTHER): Payer: Medicare HMO | Admitting: Ophthalmology

## 2023-07-03 DIAGNOSIS — H35033 Hypertensive retinopathy, bilateral: Secondary | ICD-10-CM

## 2023-07-03 DIAGNOSIS — I10 Essential (primary) hypertension: Secondary | ICD-10-CM | POA: Diagnosis not present

## 2023-07-03 DIAGNOSIS — Z961 Presence of intraocular lens: Secondary | ICD-10-CM | POA: Diagnosis not present

## 2023-07-03 DIAGNOSIS — H47012 Ischemic optic neuropathy, left eye: Secondary | ICD-10-CM

## 2023-07-03 DIAGNOSIS — H35342 Macular cyst, hole, or pseudohole, left eye: Secondary | ICD-10-CM

## 2023-07-26 DIAGNOSIS — M5412 Radiculopathy, cervical region: Secondary | ICD-10-CM | POA: Diagnosis not present

## 2023-07-26 DIAGNOSIS — S32049A Unspecified fracture of fourth lumbar vertebra, initial encounter for closed fracture: Secondary | ICD-10-CM | POA: Diagnosis not present

## 2023-07-26 DIAGNOSIS — Z6832 Body mass index (BMI) 32.0-32.9, adult: Secondary | ICD-10-CM | POA: Diagnosis not present

## 2023-08-06 DIAGNOSIS — H524 Presbyopia: Secondary | ICD-10-CM | POA: Diagnosis not present

## 2023-08-10 ENCOUNTER — Encounter: Payer: Self-pay | Admitting: Neurosurgery

## 2023-08-10 ENCOUNTER — Other Ambulatory Visit: Payer: Self-pay | Admitting: Neurosurgery

## 2023-08-10 DIAGNOSIS — M5412 Radiculopathy, cervical region: Secondary | ICD-10-CM

## 2023-08-21 ENCOUNTER — Ambulatory Visit: Payer: Medicare HMO | Admitting: Internal Medicine

## 2023-08-21 DIAGNOSIS — L820 Inflamed seborrheic keratosis: Secondary | ICD-10-CM | POA: Diagnosis not present

## 2023-08-21 DIAGNOSIS — L814 Other melanin hyperpigmentation: Secondary | ICD-10-CM | POA: Diagnosis not present

## 2023-08-21 DIAGNOSIS — Z85820 Personal history of malignant melanoma of skin: Secondary | ICD-10-CM | POA: Diagnosis not present

## 2023-08-21 DIAGNOSIS — D485 Neoplasm of uncertain behavior of skin: Secondary | ICD-10-CM | POA: Diagnosis not present

## 2023-08-21 DIAGNOSIS — L570 Actinic keratosis: Secondary | ICD-10-CM | POA: Diagnosis not present

## 2023-08-21 DIAGNOSIS — D225 Melanocytic nevi of trunk: Secondary | ICD-10-CM | POA: Diagnosis not present

## 2023-09-05 ENCOUNTER — Other Ambulatory Visit: Payer: Self-pay | Admitting: Internal Medicine

## 2023-09-05 ENCOUNTER — Ambulatory Visit
Admission: RE | Admit: 2023-09-05 | Discharge: 2023-09-05 | Disposition: A | Discharge status: Home / Self Care | Payer: Medicare HMO | Source: Ambulatory Visit | Attending: Neurosurgery | Admitting: Neurosurgery

## 2023-09-05 DIAGNOSIS — M5021 Other cervical disc displacement,  high cervical region: Secondary | ICD-10-CM | POA: Diagnosis not present

## 2023-09-05 DIAGNOSIS — M50221 Other cervical disc displacement at C4-C5 level: Secondary | ICD-10-CM | POA: Diagnosis not present

## 2023-09-05 DIAGNOSIS — M50222 Other cervical disc displacement at C5-C6 level: Secondary | ICD-10-CM | POA: Diagnosis not present

## 2023-09-05 DIAGNOSIS — M5412 Radiculopathy, cervical region: Secondary | ICD-10-CM

## 2023-09-05 DIAGNOSIS — M50223 Other cervical disc displacement at C6-C7 level: Secondary | ICD-10-CM | POA: Diagnosis not present

## 2023-09-13 DIAGNOSIS — D485 Neoplasm of uncertain behavior of skin: Secondary | ICD-10-CM | POA: Diagnosis not present

## 2023-09-13 DIAGNOSIS — C4442 Squamous cell carcinoma of skin of scalp and neck: Secondary | ICD-10-CM | POA: Diagnosis not present

## 2023-09-19 DIAGNOSIS — S32049A Unspecified fracture of fourth lumbar vertebra, initial encounter for closed fracture: Secondary | ICD-10-CM | POA: Diagnosis not present

## 2023-09-19 DIAGNOSIS — M5412 Radiculopathy, cervical region: Secondary | ICD-10-CM | POA: Diagnosis not present

## 2023-09-19 DIAGNOSIS — Z6832 Body mass index (BMI) 32.0-32.9, adult: Secondary | ICD-10-CM | POA: Diagnosis not present

## 2023-09-24 DIAGNOSIS — H52223 Regular astigmatism, bilateral: Secondary | ICD-10-CM | POA: Diagnosis not present

## 2023-09-24 DIAGNOSIS — H35033 Hypertensive retinopathy, bilateral: Secondary | ICD-10-CM | POA: Diagnosis not present

## 2023-09-26 DIAGNOSIS — L0889 Other specified local infections of the skin and subcutaneous tissue: Secondary | ICD-10-CM | POA: Diagnosis not present

## 2023-11-01 ENCOUNTER — Ambulatory Visit: Payer: Medicare HMO | Attending: Cardiology | Admitting: Cardiology

## 2023-11-01 ENCOUNTER — Encounter: Payer: Self-pay | Admitting: Cardiology

## 2023-11-01 VITALS — BP 138/70 | HR 70 | Resp 16 | Ht 65.0 in | Wt 205.0 lb

## 2023-11-01 DIAGNOSIS — I350 Nonrheumatic aortic (valve) stenosis: Secondary | ICD-10-CM | POA: Diagnosis not present

## 2023-11-01 DIAGNOSIS — I10 Essential (primary) hypertension: Secondary | ICD-10-CM | POA: Diagnosis not present

## 2023-11-01 DIAGNOSIS — E782 Mixed hyperlipidemia: Secondary | ICD-10-CM | POA: Diagnosis not present

## 2023-11-01 MED ORDER — ROSUVASTATIN CALCIUM 20 MG PO TABS: 20.0000 mg | ORAL_TABLET | Freq: Every day | ORAL | 3 refills | Status: DC

## 2023-11-01 NOTE — Progress Notes (Signed)
  Cardiology Office Note:  .   Date:  11/01/2023  ID:  QUAME SPRATLIN, DOB [birth year], MRN [id number] PCP: Henrine Screws, MD  [facility] Providers Cardiologist:  Truett Mainland, MD PCP: Henrine Screws, MD  Chief Complaint  Patient presents with   Nonrheumatic aortic valve stenosis   Follow-up      History of Present Illness: Anthony Hebert    Anthony Hebert is a 73 y.o. male with hypertension, hyperlipidemia, aortic stenosis  Patient works on taxidermy, physical activity is limited mostly due to back issues.  He denies chest pain, shortness of breath, palpitations, leg edema, orthopnea, PND, TIA/syncope.  Reviewed recent lab results with patient, details below.  Blood pressure is generally well-controlled.   Vitals:   11/01/23 0905  BP: 138/70  Pulse: 70  Resp: 16  SpO2: 97%     ROS:  Review of Systems  Cardiovascular:  Negative for chest pain, dyspnea on exertion, leg swelling, palpitations and syncope.     Studies Reviewed: Anthony Hebert        EKG 11/01/2023: Normal sinus rhythm Left anterior fascicular block When compared with ECG of 22-Mar-2016 09:52, Left anterior fascicular block is now Present  Independently interpreted 03/2023: Chol 154, TG 122, HDL 49, LDL 83 Hb 15.5 Cr 1.0   Independently interpreted Echocardiogram 06/2021: EF 60 to 65%.  Normal RV systolic function. Moderate calcification of aortic valve.  Mild aortic stenosis.  V-max 2.4 m/s, mean gradient 12 mmHg, AVA 1.3 cm.   Physical Exam:   Physical Exam Vitals and nursing note reviewed.  Constitutional:      General: He is not in acute distress. Neck:     Vascular: No JVD.  Cardiovascular:     Rate and Rhythm: Normal rate and regular rhythm.     Heart sounds: Murmur heard.     Harsh midsystolic murmur is present with a grade of 2/6 at the upper right sternal border radiating to the neck.  Pulmonary:     Effort: Pulmonary effort is normal.     Breath sounds: Normal breath sounds. No  wheezing or rales.  Musculoskeletal:     Right lower leg: No edema.     Left lower leg: No edema.      VISIT DIAGNOSES:   ICD-10-CM   1. Primary hypertension  I10 EKG 12-Lead    2. Mixed hyperlipidemia  E78.2 rosuvastatin (CRESTOR) 20 MG tablet    3. Nonrheumatic aortic valve stenosis  I35.0 EKG 12-Lead    ECHOCARDIOGRAM COMPLETE       ASSESSMENT AND PLAN: .    Anthony Hebert is a 73 y.o. male with hypertension, hyperlipidemia, aortic stenosis  Aortic stenosis: By exam, appears moderate. Check echocardiogram.  Hypertension: Generally well-controlled.  No change made today.  Continue lisinopril 20 mg daily.  Mixed hyperlipidemia: LDL fairly well-controlled.  Refill Crestor 20 mg daily.       Meds ordered this encounter  Medications   rosuvastatin (CRESTOR) 20 MG tablet    Sig: Take 1 tablet (20 mg total) by mouth daily.    Dispense:  90 tablet    Refill:  3     F/u in 1 year  Signed, Elder Negus, MD

## 2023-11-01 NOTE — Patient Instructions (Signed)
 Medication Instructions:   Your physician recommends that you continue on your current medications as directed. Please refer to the Current Medication list given to you today.  *If you need a refill on your cardiac medications before your next appointment, please call your pharmacy*    Testing/Procedures:  Your physician has requested that you have an echocardiogram. Echocardiography is a painless test that uses sound waves to create images of your heart. It provides your doctor with information about the size and shape of your heart and how well your heart's chambers and valves are working. This procedure takes approximately one hour. There are no restrictions for this procedure. Please do NOT wear cologne, perfume, aftershave, or lotions (deodorant is allowed). Please arrive 15 minutes prior to your appointment time.  Please note: We ask at that you not bring children with you during ultrasound (echo/ vascular) testing. Due to room size and safety concerns, children are not allowed in the ultrasound rooms during exams. Our front office staff cannot provide observation of children in our lobby area while testing is being conducted. An adult accompanying a patient to their appointment will only be allowed in the ultrasound room at the discretion of the ultrasound technician under special circumstances. We apologize for any inconvenience.    Follow-Up: At [facility], you and your health needs are our priority.  As part of our continuing mission to provide you with exceptional heart care, we have created designated Provider Care Teams.  These Care Teams include your primary Cardiologist (physician) and Advanced Practice Providers (APPs -  Physician Assistants and Nurse Practitioners) who all work together to provide you with the care you need, when you need it.  We recommend signing up for the patient portal called "MyChart".  Sign up information is provided on this After Visit Summary.   MyChart is used to connect with patients for Virtual Visits (Telemedicine).  Patients are able to view lab/test results, encounter notes, upcoming appointments, etc.  Non-urgent messages can be sent to your provider as well.   To learn more about what you can do with MyChart, go to [email or web address].    Your next appointment:   1 year(s)  Provider:   DR. Rosemary Holms   Other Instructions    1st Floor: - Lobby - Registration  - Pharmacy  - Lab - Cafe  2nd Floor: - PV Lab - Diagnostic Testing (echo, CT, nuclear med)  3rd Floor: - Vacant  4th Floor: - TCTS (cardiothoracic surgery) - AFib Clinic - Structural Heart Clinic - Vascular Surgery  - Vascular Ultrasound  5th Floor: - HeartCare Cardiology (general and EP) - Clinical Pharmacy for coumadin, hypertension, lipid, weight-loss medications, and med management appointments    Valet parking services will be available as well.

## 2023-11-23 ENCOUNTER — Ambulatory Visit (HOSPITAL_COMMUNITY): Attending: Cardiology

## 2023-11-23 DIAGNOSIS — I350 Nonrheumatic aortic (valve) stenosis: Secondary | ICD-10-CM | POA: Diagnosis not present

## 2023-11-23 LAB — ECHOCARDIOGRAM COMPLETE
AR max vel: 1.79 cm2
AV Area VTI: 1.76 cm2
AV Area mean vel: 1.69 cm2
AV Mean grad: 13 mmHg
AV Peak grad: 23.3 mmHg
Ao pk vel: 2.42 m/s
Area-P 1/2: 3.39 cm2
S' Lateral: 2.6 cm

## 2023-11-24 NOTE — Progress Notes (Signed)
 Mild narrowing of aortic valve, expected based on physical exam. Repeat echocardiogram in 1 year.  Thanks MJP

## 2023-11-26 ENCOUNTER — Other Ambulatory Visit: Payer: Self-pay

## 2023-11-26 DIAGNOSIS — I350 Nonrheumatic aortic (valve) stenosis: Secondary | ICD-10-CM

## 2024-01-16 DIAGNOSIS — Z6832 Body mass index (BMI) 32.0-32.9, adult: Secondary | ICD-10-CM | POA: Diagnosis not present

## 2024-01-16 DIAGNOSIS — S63592D Other specified sprain of left wrist, subsequent encounter: Secondary | ICD-10-CM | POA: Diagnosis not present

## 2024-01-16 DIAGNOSIS — S63591D Other specified sprain of right wrist, subsequent encounter: Secondary | ICD-10-CM | POA: Diagnosis not present

## 2024-01-16 DIAGNOSIS — M79621 Pain in right upper arm: Secondary | ICD-10-CM | POA: Diagnosis not present

## 2024-01-16 DIAGNOSIS — S32049A Unspecified fracture of fourth lumbar vertebra, initial encounter for closed fracture: Secondary | ICD-10-CM | POA: Diagnosis not present

## 2024-02-19 DIAGNOSIS — D485 Neoplasm of uncertain behavior of skin: Secondary | ICD-10-CM | POA: Diagnosis not present

## 2024-02-19 DIAGNOSIS — D225 Melanocytic nevi of trunk: Secondary | ICD-10-CM | POA: Diagnosis not present

## 2024-02-19 DIAGNOSIS — L814 Other melanin hyperpigmentation: Secondary | ICD-10-CM | POA: Diagnosis not present

## 2024-02-19 DIAGNOSIS — Z85820 Personal history of malignant melanoma of skin: Secondary | ICD-10-CM | POA: Diagnosis not present

## 2024-02-19 DIAGNOSIS — L570 Actinic keratosis: Secondary | ICD-10-CM | POA: Diagnosis not present

## 2024-03-26 NOTE — Progress Notes (Signed)
 [facility] - Clinic Note  04/01/2024     CHIEF COMPLAINT Patient presents for Retina Follow Up   HISTORY OF PRESENT ILLNESS: Anthony Hebert is a 73 y.o. male who presents to the clinic today for:   HPI     Retina Follow Up   Patient presents with  Other.  In left eye.  This started 2 years ago.  Duration of 9 months.  Since onset it is stable.  I, the attending physician,  performed the HPI with the patient and updated documentation appropriately.        Comments   Pt states he feels the right eye is straining especially with near vision. Pt has both DVA and NVA glasses now but did not bring them. Pt going to [facility] in [place]. Pt states he is getting Restasis  in his right eye at least once per day.       Last edited by Valdemar Rogue, MD on 04/01/2024  8:14 AM.     Pt states he's doing well, no changes in overall health. Seeing a new eye doctor-Dr. Newt. Can see a little more around central vision OS but nothing he can focus on. Wearing glasses more often. Got new rx specs Feb/March 2025 but didn't bring them today.   Referring physician: Elma Shona RAMAN, MD [address] [place],  [place] [address]  HISTORICAL INFORMATION:   Selected notes from the MEDICAL RECORD NUMBER Referred by Dr. Meridee for mac hole OS LEE:  Ocular Hx- PMH-    CURRENT MEDICATIONS: No current outpatient medications on file. (Ophthalmic Drugs)   No current facility-administered medications for this visit. (Ophthalmic Drugs)   Current Outpatient Medications (Other)  Medication Sig   allopurinol  (ZYLOPRIM ) 100 MG tablet Take 200 mg by mouth daily.   bismuth subsalicylate (PEPTO BISMOL) 262 MG chewable tablet Chew 262 mg by mouth as needed for indigestion.   HYDROcodone -acetaminophen  (NORCO/VICODIN) 5-325 MG tablet Take 1 tablet by mouth every 6 (six) hours as needed.   lisinopril  (ZESTRIL ) 20 MG tablet Take 20 mg by mouth daily.   methocarbamol   (ROBAXIN ) 500 MG tablet Take 1 tablet (500 mg total) by mouth every 6 (six) hours as needed for muscle spasms.   rosuvastatin  (CRESTOR ) 20 MG tablet Take 1 tablet (20 mg total) by mouth daily.   triamcinolone  cream (KENALOG ) 0.1 % APPLY TWICE DAILY TO THE AFFECTED AREAS FOR 14 DAYS   No current facility-administered medications for this visit. (Other)   REVIEW OF SYSTEMS: ROS   Positive for: Musculoskeletal, Cardiovascular, Eyes Negative for: Constitutional, Gastrointestinal, Neurological, Skin, Genitourinary, HENT, Endocrine, Respiratory, Psychiatric, Allergic/Imm, Heme/Lymph Last edited by Elnor Avelina RAMAN, COT on 04/01/2024  7:57 AM.       ALLERGIES No Known Allergies  PAST MEDICAL HISTORY Past Medical History:  Diagnosis Date   Acute idiopathic gout of right ankle    Aortic valve sclerosis    Arthritis    all over   Chronic back pain    all over   Complication of anesthesia    slow to wake up at times   DDD (degenerative disc disease), lumbar    Difficult intubation    had trouble with intubation shape of throat;last sx no problems per pt; 03/28/16 successful oral ETT with green Glidescope, stylet. Has limited oral opening/small glottic opening   Foot swelling    in [place] 07/2013   GERD (gastroesophageal reflux disease)    occ   Gout of left  foot 10/2011   in [place]   Heart murmur    states no problems   History of melanoma    Hypercholesterolemia    Hyperlipidemia    Hypertension    Melanoma (HCC) ~ 2010   above left eye   Nonrheumatic aortic valve stenosis    Other chronic pain    Primary osteoarthritis involving multiple joints    Rising PSA level    Screening for prostate cancer    Stroke [facility])    maybe w/my 1st back OR in the 1990's; denies residual on 07/15/2015   Use of cane as ambulatory aid    straight   Walker as ambulation aid    Past Surgical History:  Procedure Laterality Date   25 GAUGE PARS PLANA VITRECTOMY WITH 20 GAUGE MVR PORT FOR MACULAR  HOLE Left 05/25/2022   Procedure: 25 GAUGE PARS PLANA VITRECTOMY WITH 20 GAUGE MVR PORT FOR MACULAR HOLE;  Surgeon: Valdemar Rogue, MD;  Location: [facility] OR;  Service: Ophthalmology;  Laterality: Left;   APPENDECTOMY     BACK SURGERY     CARPAL TUNNEL RELEASE Right 07/15/2015   w/GUYON'S CANAL RELEASE    CARPAL TUNNEL RELEASE Right 07/15/2015   Procedure: RIGHT FOREARM AND WRIST CARPAL TUNNEL RELEASE;  Surgeon: Elsie Mussel, MD;  Location: [facility] OR;  Service: Orthopedics;  Laterality: Right;   CARPAL TUNNEL RELEASE Left 03/22/2017   Procedure: OPEN CARPAL TUNNEL RELEASE;  Surgeon: Mussel Elsie, MD;  Location: [facility] OR;  Service: Orthopedics;  Laterality: Left;  60 mins   COLONOSCOPY     COLONOSCOPY WITH PROPOFOL  N/A 01/05/2022   Procedure: COLONOSCOPY WITH PROPOFOL ;  Surgeon: Teressa Toribio SQUIBB, MD;  Location: [facility];  Service: Gastroenterology;  Laterality: N/A;   FOOT SURGERY Right    cut nerve to relieve pain   GAS INSERTION Left 05/25/2022   Procedure: INSERTION OF GAS;  Surgeon: Valdemar Rogue, MD;  Location: [facility] OR;  Service: Ophthalmology;  Laterality: Left;   HEMOSTASIS CLIP PLACEMENT  01/05/2022   Procedure: HEMOSTASIS CLIP PLACEMENT;  Surgeon: Teressa Toribio SQUIBB, MD;  Location: [facility];  Service: Gastroenterology;;   INGUINAL HERNIA REPAIR Left 1994   INSERTION OF MESH N/A 03/28/2016   Procedure: INSERTION OF MESH;  Surgeon: Donnice Bury, MD;  Location: [facility] OR;  Service: General;  Laterality: N/A;   MELANOMA EXCISION     MEMBRANE PEEL Left 05/25/2022   Procedure: MEMBRANE PEEL;  Surgeon: Valdemar Rogue, MD;  Location: [facility] OR;  Service: Ophthalmology;  Laterality: Left;   POLYPECTOMY  01/05/2022   Procedure: POLYPECTOMY;  Surgeon: Teressa Toribio SQUIBB, MD;  Location: [facility];  Service: Gastroenterology;;   POSTERIOR CERVICAL LAMINECTOMY Right 10/07/2013   Procedure: POSTERIOR CERVICAL LAMINECTOMY RIGHT CERVICAL SEVEN -THORACIC ONE FORAMINOTOMY;  Surgeon: Darina MALVA Boehringer, MD;  Location:  [facility];  Service: Neurosurgery;  Laterality: Right;   POSTERIOR LUMBAR FUSION  X 7   SPINAL CORD STIMULATOR IMPLANT  ~ 2012   SPINAL CORD STIMULATOR REMOVAL N/A 09/19/2012   Procedure: LUMBAR SPINAL CORD STIMULATOR REMOVAL;  Surgeon: Darina MALVA Boehringer, MD;  Location: [facility];  Service: Neurosurgery;  Laterality: N/A;   TONSILLECTOMY     TOTAL HIP ARTHROPLASTY Right 05/01/2022   Procedure: TOTAL HIP ARTHROPLASTY ANTERIOR APPROACH;  Surgeon: Melodi Lerner, MD;  Location: [facility];  Service: Orthopedics;  Laterality: Right;   ULNAR NERVE TRANSPOSITION Right 07/15/2015   supercharged AIN to deep motor branch ulnar nerve nerve transfer   UMBILICAL HERNIA REPAIR N/A 03/28/2016  Procedure: LAPAROSCOPIC UMBILICAL HERNIA;  Surgeon: Donnice Bury, MD;  Location: [facility] OR;  Service: General;  Laterality: N/A;   FAMILY HISTORY Family History  Problem Relation Age of Onset   Cancer Mother    Alzheimer's disease Father    Colon cancer Neg Hx    Colon polyps Neg Hx    Esophageal cancer Neg Hx    Stomach cancer Neg Hx    Rectal cancer Neg Hx    SOCIAL HISTORY Social History   Tobacco Use   Smoking status: Never   Smokeless tobacco: Never  Vaping Use   Vaping status: Never Used  Substance Use Topics   Alcohol use: Yes    Alcohol/week: 1.0 - 2.0 standard drink of alcohol    Types: 1 - 2 Cans of beer per week    Comment: socially   Drug use: No       OPHTHALMIC EXAM:  Base Eye Exam     Visual Acuity (Snellen - Linear)       Right Left   Dist [place] 20/25 -2 >800   Dist ph [place] 20/20 -1 NI         Tonometry (Tonopen, 8:01 AM)       Right Left   Pressure 19 17         Pupils       Pupils Dark Light Shape React APD   Right PERRL 3 2 Round Brisk None   Left PERRL 3 2 Round Brisk None         Visual Fields       Left Right    Full Full         Extraocular Movement       Right Left    Full, Ortho Full, Ortho         Neuro/Psych     Oriented x3: Yes    Mood/Affect: Normal         Dilation     Both eyes: 1.0% Mydriacyl , 2.5% Phenylephrine  @ 8:02 AM           Slit Lamp and Fundus Exam     Slit Lamp Exam       Right Left   Lids/Lashes Dermatochalasis - upper lid Dermatochalasis - upper lid   Conjunctiva/Sclera White and quiet nasal and temporal pinguecula   Cornea mild arcus, tear film debris, well healed cataract wound, trace PEE arcus, well healed cataract wound, trace tear film debris   Anterior Chamber deep and clear deep and clear   Iris Round and dilated Round and dilated   Lens PC IOL in good position PC IOL in good position, small PC opening   Anterior Vitreous mild syneresis, Posterior vitreous detachment post vitrectomy, mild cell/pigment, gas bubble gone         Fundus Exam       Right Left   Disc mild Pallor, Sharp rim, mild PPP 3+temporal Pallor, Sharp rim, PPA, focal PPP   C/D Ratio 0.4 0.3   Macula Flat, Good foveal reflex, mild RPE mottling, No heme or edema Flat, Blunted foveal reflex, mac hole closed, mild RPE mottling, No heme or edema   Vessels attenuated, Tortuous mild attenuation, mild tortuosity   Periphery Attached, no heme Attached, good 360 peripheral laser changes, No heme           IMAGING AND PROCEDURES  Imaging and Procedures for 04/01/2024  OCT, Retina - OU - Both Eyes       Right Eye Quality was  good. Central Foveal Thickness: 269. Progression has been stable. Findings include normal foveal contour, no IRF, no SRF.   Left Eye Quality was good. Central Foveal Thickness: 346. Progression has improved. Findings include no SRF, abnormal foveal contour, intraretinal fluid, outer retinal atrophy (Mac hole closed; fine central cystic changes -- ?schisis--improved, mild decrease in ellipsoid signal centrally; tr vit opacities -- improved).   Notes *Images captured and stored on drive  Diagnosis / Impression:  OD: NFP, no IRF/SRF OS: Mac hole closed; fine central cystic changes --  ?schisis--improved, mild decrease in ellipsoid signal centrally; tr vit opacities -- improved  Clinical management:  See below  Abbreviations: NFP - Normal foveal profile. CME - cystoid macular edema. PED - pigment epithelial detachment. IRF - intraretinal fluid. SRF - subretinal fluid. EZ - ellipsoid zone. ERM - epiretinal membrane. ORA - outer retinal atrophy. ORT - outer retinal tubulation. SRHM - subretinal hyper-reflective material. IRHM - intraretinal hyper-reflective material             ASSESSMENT/PLAN:    ICD-10-CM   1. Macular hole of left eye  H35.342 OCT, Retina - OU - Both Eyes    2. NAION (non-arteritic anterior ischemic optic neuropathy), left  H47.012     3. Essential hypertension  I10     4. Hypertensive retinopathy of both eyes  H35.033     5. Pseudophakia, both eyes  Z96.1      Macular hole, left eye   - pre op OCT shows stable, thin FTMH, mild interval improvement in surrounding cystic changes  - pre op BCVA 20/70 - s/p PPV/TissueBlue /MP/14% C3F8 OS, 10.12.23             - mac hole closed nicely, retina attached  - gas bubble gone  - BCVA: decreased to CF from 20/400 -- post-op NAION             - IOP okay at 17  - f/u 1 year, sooner PRN, DFE, OCT  2. NAION OS  - decreased vision out of proportion with post-op course -- mac hole closed nicely and gas bubble not impeding central macula  - pt denies temporal pain, jaw claudication, headache; ROS for GCA negative  - exam shows pale disc  - HVF 30-2 on 12.14.23 shows inferior altitudinal scotoma  - recommend restarting ASA  - recommend appointment with PCP and possible sleep study  3,4. Hypertensive retinopathy OU - discussed importance of tight BP control - monitor  5. Pseudophakia OU  - s/p CE/IOL OU (Dr. Lavonia)  - IOLs in good position, doing well  - monitor  Ophthalmic Meds Ordered this visit:  No orders of the defined types were placed in this encounter.    Return in about 1 year (around  04/01/2025) for mac hole OS, DFE, OCT.  There are no Patient Instructions on file for this visit.   Explained the diagnoses, plan, and follow up with the patient and they expressed understanding.  Patient expressed understanding of the importance of proper follow up care.   This document serves as a record of services personally performed by Redell JUDITHANN Hans, MD, PhD. It was created on their behalf by Auston Muzzy, COMT. The creation of this record is the provider's dictation and/or activities during the visit.  Electronically signed by: Auston Muzzy, COMT 04/06/24 3:56 PM  This document serves as a record of services personally performed by Redell JUDITHANN Hans, MD, PhD. It was created on their behalf by Almetta Pesa, an ophthalmic  technician. The creation of this record is the provider's dictation and/or activities during the visit.    Electronically signed by: Almetta Pesa, OA, 04/06/24  3:56 PM  Redell JUDITHANN Hans, M.D., Ph.D. Diseases & Surgery of the Retina and Vitreous [facility]  I have reviewed the above documentation for accuracy and completeness, and I agree with the above. Redell JUDITHANN Hans, M.D., Ph.D. 04/06/24 3:57 PM   Abbreviations: M myopia (nearsighted); A astigmatism; H hyperopia (farsighted); P presbyopia; Mrx spectacle prescription;  CTL contact lenses; OD right eye; OS left eye; OU both eyes  XT exotropia; ET esotropia; PEK punctate epithelial keratitis; PEE punctate epithelial erosions; DES dry eye syndrome; MGD meibomian gland dysfunction; ATs artificial tears; PFAT's preservative free artificial tears; NSC nuclear sclerotic cataract; PSC posterior subcapsular cataract; ERM epi-retinal membrane; PVD posterior vitreous detachment; RD retinal detachment; DM diabetes mellitus; DR diabetic retinopathy; NPDR non-proliferative diabetic retinopathy; PDR proliferative diabetic retinopathy; CSME clinically significant macular edema; DME diabetic macular edema;  dbh dot blot hemorrhages; CWS cotton wool spot; POAG primary open angle glaucoma; C/D cup-to-disc ratio; HVF humphrey visual field; GVF goldmann visual field; OCT optical coherence tomography; IOP intraocular pressure; BRVO Branch retinal vein occlusion; CRVO central retinal vein occlusion; CRAO central retinal artery occlusion; BRAO branch retinal artery occlusion; RT retinal tear; SB scleral buckle; PPV pars plana vitrectomy; VH Vitreous hemorrhage; PRP panretinal laser photocoagulation; IVK intravitreal kenalog ; VMT vitreomacular traction; MH Macular hole;  NVD neovascularization of the disc; NVE neovascularization elsewhere; AREDS age related eye disease study; ARMD age related macular degeneration; POAG primary open angle glaucoma; EBMD epithelial/anterior basement membrane dystrophy; ACIOL anterior chamber intraocular lens; IOL intraocular lens; PCIOL posterior chamber intraocular lens; Phaco/IOL phacoemulsification with intraocular lens placement; PRK photorefractive keratectomy; LASIK laser assisted in situ keratomileusis; HTN hypertension; DM diabetes mellitus; COPD chronic obstructive pulmonary disease

## 2024-03-26 NOTE — Progress Notes (Shared)
 [facility] - Clinic Note  04/01/2024     CHIEF COMPLAINT Patient presents for No chief complaint on file.   HISTORY OF PRESENT ILLNESS: Anthony Hebert is a 73 y.o. male who presents to the clinic today for:    Pt states left eye vision has not changed, he states his health is getting better   Referring physician: Frederik Charleston, MD [address] [place],  [place] [address]  HISTORICAL INFORMATION:   Selected notes from the MEDICAL RECORD NUMBER Referred by Dr. Meridee for mac hole OS LEE:  Ocular Hx- PMH-    CURRENT MEDICATIONS: No current outpatient medications on file. (Ophthalmic Drugs)   No current facility-administered medications for this visit. (Ophthalmic Drugs)   Current Outpatient Medications (Other)  Medication Sig   allopurinol  (ZYLOPRIM ) 100 MG tablet Take 200 mg by mouth daily.   bismuth subsalicylate (PEPTO BISMOL) 262 MG chewable tablet Chew 262 mg by mouth as needed for indigestion.   HYDROcodone -acetaminophen  (NORCO/VICODIN) 5-325 MG tablet Take 1 tablet by mouth every 6 (six) hours as needed.   lisinopril  (ZESTRIL ) 20 MG tablet Take 20 mg by mouth daily.   methocarbamol  (ROBAXIN ) 500 MG tablet Take 1 tablet (500 mg total) by mouth every 6 (six) hours as needed for muscle spasms.   rosuvastatin  (CRESTOR ) 20 MG tablet Take 1 tablet (20 mg total) by mouth daily.   triamcinolone  cream (KENALOG ) 0.1 % APPLY TWICE DAILY TO THE AFFECTED AREAS FOR 14 DAYS   No current facility-administered medications for this visit. (Other)   REVIEW OF SYSTEMS:    ALLERGIES No Known Allergies  PAST MEDICAL HISTORY Past Medical History:  Diagnosis Date   Acute idiopathic gout of right ankle    Aortic valve sclerosis    Arthritis    all over   Chronic back pain    all over   Complication of anesthesia    slow to wake up at times   DDD (degenerative disc disease), lumbar    Difficult intubation    had trouble with  intubation shape of throat;last sx no problems per pt; 03/28/16 successful oral ETT with green Glidescope, stylet. Has limited oral opening/small glottic opening   Foot swelling    in [facility] 07/2013   GERD (gastroesophageal reflux disease)    occ   Gout of left foot 10/2011   in [facility]   Heart murmur    states no problems   History of melanoma    Hypercholesterolemia    Hyperlipidemia    Hypertension    Melanoma (HCC) ~ 2010   above left eye   Nonrheumatic aortic valve stenosis    Other chronic pain    Primary osteoarthritis involving multiple joints    Rising PSA level    Screening for prostate cancer    Stroke [facility])    maybe w/my 1st back OR in the 1990's; denies residual on 07/15/2015   Use of cane as ambulatory aid    straight   Walker as ambulation aid    Past Surgical History:  Procedure Laterality Date   25 GAUGE PARS PLANA VITRECTOMY WITH 20 GAUGE MVR PORT FOR MACULAR HOLE Left 05/25/2022   Procedure: 25 GAUGE PARS PLANA VITRECTOMY WITH 20 GAUGE MVR PORT FOR MACULAR HOLE;  Surgeon: Valdemar Rogue, MD;  Location: [facility] OR;  Service: Ophthalmology;  Laterality: Left;   APPENDECTOMY     BACK SURGERY     CARPAL TUNNEL RELEASE Right 07/15/2015   w/GUYON'S  CANAL RELEASE    CARPAL TUNNEL RELEASE Right 07/15/2015   Procedure: RIGHT FOREARM AND WRIST CARPAL TUNNEL RELEASE;  Surgeon: Elsie Mussel, MD;  Location: [facility] OR;  Service: Orthopedics;  Laterality: Right;   CARPAL TUNNEL RELEASE Left 03/22/2017   Procedure: OPEN CARPAL TUNNEL RELEASE;  Surgeon: Mussel Elsie, MD;  Location: [facility] OR;  Service: Orthopedics;  Laterality: Left;  60 mins   COLONOSCOPY     COLONOSCOPY WITH PROPOFOL  N/A 01/05/2022   Procedure: COLONOSCOPY WITH PROPOFOL ;  Surgeon: Teressa Toribio SQUIBB, MD;  Location: [facility];  Service: Gastroenterology;  Laterality: N/A;   FOOT SURGERY Right    cut nerve to relieve pain   GAS INSERTION Left 05/25/2022   Procedure: INSERTION OF GAS;  Surgeon: Valdemar Rogue, MD;  Location:  [facility] OR;  Service: Ophthalmology;  Laterality: Left;   HEMOSTASIS CLIP PLACEMENT  01/05/2022   Procedure: HEMOSTASIS CLIP PLACEMENT;  Surgeon: Teressa Toribio SQUIBB, MD;  Location: [facility];  Service: Gastroenterology;;   INGUINAL HERNIA REPAIR Left 1994   INSERTION OF MESH N/A 03/28/2016   Procedure: INSERTION OF MESH;  Surgeon: Donnice Bury, MD;  Location: [facility] OR;  Service: General;  Laterality: N/A;   MELANOMA EXCISION     MEMBRANE PEEL Left 05/25/2022   Procedure: MEMBRANE PEEL;  Surgeon: Valdemar Rogue, MD;  Location: [facility] OR;  Service: Ophthalmology;  Laterality: Left;   POLYPECTOMY  01/05/2022   Procedure: POLYPECTOMY;  Surgeon: Teressa Toribio SQUIBB, MD;  Location: [facility];  Service: Gastroenterology;;   POSTERIOR CERVICAL LAMINECTOMY Right 10/07/2013   Procedure: POSTERIOR CERVICAL LAMINECTOMY RIGHT CERVICAL SEVEN -THORACIC ONE FORAMINOTOMY;  Surgeon: Darina MALVA Boehringer, MD;  Location: [facility];  Service: Neurosurgery;  Laterality: Right;   POSTERIOR LUMBAR FUSION  X 7   SPINAL CORD STIMULATOR IMPLANT  ~ 2012   SPINAL CORD STIMULATOR REMOVAL N/A 09/19/2012   Procedure: LUMBAR SPINAL CORD STIMULATOR REMOVAL;  Surgeon: Darina MALVA Boehringer, MD;  Location: [facility];  Service: Neurosurgery;  Laterality: N/A;   TONSILLECTOMY     TOTAL HIP ARTHROPLASTY Right 05/01/2022   Procedure: TOTAL HIP ARTHROPLASTY ANTERIOR APPROACH;  Surgeon: Melodi Lerner, MD;  Location: [facility];  Service: Orthopedics;  Laterality: Right;   ULNAR NERVE TRANSPOSITION Right 07/15/2015   supercharged AIN to deep motor branch ulnar nerve nerve transfer   UMBILICAL HERNIA REPAIR N/A 03/28/2016   Procedure: LAPAROSCOPIC UMBILICAL HERNIA;  Surgeon: Donnice Bury, MD;  Location: [facility] OR;  Service: General;  Laterality: N/A;   FAMILY HISTORY Family History  Problem Relation Age of Onset   Cancer Mother    Alzheimer's disease Father    Colon cancer Neg Hx    Colon polyps Neg Hx    Esophageal cancer Neg Hx    Stomach cancer  Neg Hx    Rectal cancer Neg Hx    SOCIAL HISTORY Social History   Tobacco Use   Smoking status: Never   Smokeless tobacco: Never  Vaping Use   Vaping status: Never Used  Substance Use Topics   Alcohol use: Yes    Alcohol/week: 1.0 - 2.0 standard drink of alcohol    Types: 1 - 2 Cans of beer per week    Comment: socially   Drug use: No       OPHTHALMIC EXAM:  Not recorded    IMAGING AND PROCEDURES  Imaging and Procedures for 04/01/2024          ASSESSMENT/PLAN:    ICD-10-CM   1. Macular hole of left eye  H35.342     2. NAION (non-arteritic anterior ischemic optic neuropathy), left  H47.012     3. Essential hypertension  I10     4. Hypertensive retinopathy of both eyes  H35.033     5. Pseudophakia, both eyes  Z96.1        Macular hole, left eye   - pre op OCT shows stable, thin FTMH, mild interval improvement in surrounding cystic changes  - pre op BCVA 20/70 - s/p PPV/TissueBlue /MP/14% C3F8 OS, 10.12.23             - mac hole closed nicely, retina attached  - gas bubble gone  - BCVA: decreased to CF from 20/400 -- post-op NAION             - IOP okay at 16  - f/u 6-9 months, DFE, OCT  2. NAION OS  - decreased vision out of proportion with post-op course -- mac hole closed nicely and gas bubble not impeding central macula  - pt denies temporal pain, jaw claudication, headache; ROS for GCA negative  - exam shows pale disc  - HVF 30-2 on 12.14.23 shows inferior altitudinal scotoma  - recommend restarting ASA  - recommend appointment with PCP and possible sleep study  3,4. Hypertensive retinopathy OU - discussed importance of tight BP control - monitor  5. Pseudophakia OU  - s/p CE/IOL OU (Dr. Lavonia)  - IOLs in good position, doing well  - monitor  Ophthalmic Meds Ordered this visit:  No orders of the defined types were placed in this encounter.    No follow-ups on file.  There are no Patient Instructions on file for this  visit.   Explained the diagnoses, plan, and follow up with the patient and they expressed understanding.  Patient expressed understanding of the importance of proper follow up care.   This document serves as a record of services personally performed by Redell JUDITHANN Hans, MD, PhD. It was created on their behalf by Auston Muzzy, COMT. The creation of this record is the provider's dictation and/or activities during the visit.  Electronically signed by: Auston Muzzy, COMT 03/26/24 2:54 PM    Redell JUDITHANN Hans, M.D., Ph.D. Diseases & Surgery of the Retina and Vitreous [facility]   Abbreviations: M myopia (nearsighted); A astigmatism; H hyperopia (farsighted); P presbyopia; Mrx spectacle prescription;  CTL contact lenses; OD right eye; OS left eye; OU both eyes  XT exotropia; ET esotropia; PEK punctate epithelial keratitis; PEE punctate epithelial erosions; DES dry eye syndrome; MGD meibomian gland dysfunction; ATs artificial tears; PFAT's preservative free artificial tears; NSC nuclear sclerotic cataract; PSC posterior subcapsular cataract; ERM epi-retinal membrane; PVD posterior vitreous detachment; RD retinal detachment; DM diabetes mellitus; DR diabetic retinopathy; NPDR non-proliferative diabetic retinopathy; PDR proliferative diabetic retinopathy; CSME clinically significant macular edema; DME diabetic macular edema; dbh dot blot hemorrhages; CWS cotton wool spot; POAG primary open angle glaucoma; C/D cup-to-disc ratio; HVF humphrey visual field; GVF goldmann visual field; OCT optical coherence tomography; IOP intraocular pressure; BRVO Branch retinal vein occlusion; CRVO central retinal vein occlusion; CRAO central retinal artery occlusion; BRAO branch retinal artery occlusion; RT retinal tear; SB scleral buckle; PPV pars plana vitrectomy; VH Vitreous hemorrhage; PRP panretinal laser photocoagulation; IVK intravitreal kenalog ; VMT vitreomacular traction; MH Macular hole;  NVD  neovascularization of the disc; NVE neovascularization elsewhere; AREDS age related eye disease study; ARMD age related macular degeneration; POAG primary open angle glaucoma; EBMD epithelial/anterior basement membrane dystrophy; ACIOL anterior chamber  intraocular lens; IOL intraocular lens; PCIOL posterior chamber intraocular lens; Phaco/IOL phacoemulsification with intraocular lens placement; PRK photorefractive keratectomy; LASIK laser assisted in situ keratomileusis; HTN hypertension; DM diabetes mellitus; COPD chronic obstructive pulmonary disease

## 2024-04-01 ENCOUNTER — Ambulatory Visit (INDEPENDENT_AMBULATORY_CARE_PROVIDER_SITE_OTHER): Payer: Medicare HMO | Admitting: Ophthalmology

## 2024-04-01 ENCOUNTER — Encounter (INDEPENDENT_AMBULATORY_CARE_PROVIDER_SITE_OTHER): Payer: Self-pay | Admitting: Ophthalmology

## 2024-04-01 DIAGNOSIS — I10 Essential (primary) hypertension: Secondary | ICD-10-CM | POA: Diagnosis not present

## 2024-04-01 DIAGNOSIS — H35342 Macular cyst, hole, or pseudohole, left eye: Secondary | ICD-10-CM | POA: Diagnosis not present

## 2024-04-01 DIAGNOSIS — Z961 Presence of intraocular lens: Secondary | ICD-10-CM | POA: Diagnosis not present

## 2024-04-01 DIAGNOSIS — H35033 Hypertensive retinopathy, bilateral: Secondary | ICD-10-CM | POA: Diagnosis not present

## 2024-04-01 DIAGNOSIS — H47012 Ischemic optic neuropathy, left eye: Secondary | ICD-10-CM

## 2024-04-28 DIAGNOSIS — H3412 Central retinal artery occlusion, left eye: Secondary | ICD-10-CM | POA: Diagnosis not present

## 2024-04-29 DIAGNOSIS — M109 Gout, unspecified: Secondary | ICD-10-CM | POA: Diagnosis not present

## 2024-04-29 DIAGNOSIS — E785 Hyperlipidemia, unspecified: Secondary | ICD-10-CM | POA: Diagnosis not present

## 2024-04-29 DIAGNOSIS — I10 Essential (primary) hypertension: Secondary | ICD-10-CM | POA: Diagnosis not present

## 2024-04-29 DIAGNOSIS — Z6832 Body mass index (BMI) 32.0-32.9, adult: Secondary | ICD-10-CM | POA: Diagnosis not present

## 2024-04-29 DIAGNOSIS — Z0000 Encounter for general adult medical examination without abnormal findings: Secondary | ICD-10-CM | POA: Diagnosis not present

## 2024-04-29 DIAGNOSIS — Z23 Encounter for immunization: Secondary | ICD-10-CM | POA: Diagnosis not present

## 2024-04-29 DIAGNOSIS — I358 Other nonrheumatic aortic valve disorders: Secondary | ICD-10-CM | POA: Diagnosis not present

## 2024-04-29 DIAGNOSIS — Z1331 Encounter for screening for depression: Secondary | ICD-10-CM | POA: Diagnosis not present

## 2024-04-29 DIAGNOSIS — R972 Elevated prostate specific antigen [PSA]: Secondary | ICD-10-CM | POA: Diagnosis not present

## 2024-06-23 DIAGNOSIS — S52501D Unspecified fracture of the lower end of right radius, subsequent encounter for closed fracture with routine healing: Secondary | ICD-10-CM | POA: Diagnosis not present

## 2024-06-23 DIAGNOSIS — M24132 Other articular cartilage disorders, left wrist: Secondary | ICD-10-CM | POA: Diagnosis not present

## 2024-06-23 DIAGNOSIS — M24131 Other articular cartilage disorders, right wrist: Secondary | ICD-10-CM | POA: Diagnosis not present

## 2024-06-26 DIAGNOSIS — R972 Elevated prostate specific antigen [PSA]: Secondary | ICD-10-CM | POA: Diagnosis not present

## 2024-06-26 DIAGNOSIS — R399 Unspecified symptoms and signs involving the genitourinary system: Secondary | ICD-10-CM | POA: Diagnosis not present

## 2024-06-26 DIAGNOSIS — Z125 Encounter for screening for malignant neoplasm of prostate: Secondary | ICD-10-CM | POA: Diagnosis not present

## 2024-06-30 ENCOUNTER — Other Ambulatory Visit (HOSPITAL_COMMUNITY): Payer: Self-pay | Admitting: Urology

## 2024-06-30 DIAGNOSIS — R972 Elevated prostate specific antigen [PSA]: Secondary | ICD-10-CM

## 2024-07-11 ENCOUNTER — Encounter (HOSPITAL_COMMUNITY): Payer: Self-pay

## 2024-07-11 ENCOUNTER — Ambulatory Visit (HOSPITAL_COMMUNITY)

## 2024-07-23 DIAGNOSIS — S32049A Unspecified fracture of fourth lumbar vertebra, initial encounter for closed fracture: Secondary | ICD-10-CM | POA: Diagnosis not present

## 2024-09-15 ENCOUNTER — Other Ambulatory Visit: Payer: Self-pay | Admitting: Cardiology

## 2024-09-15 DIAGNOSIS — E782 Mixed hyperlipidemia: Secondary | ICD-10-CM

## 2024-09-16 ENCOUNTER — Telehealth: Payer: Self-pay | Admitting: Cardiology

## 2024-09-16 NOTE — Telephone Encounter (Signed)
" °*  STAT* If patient is at the pharmacy, call can be transferred to refill team.   1. Which medications need to be refilled? (please list name of each medication and dose if known)           allopurinol  (ZYLOPRIM ) 100 MG tablet      2. Would you like to learn more about the convenience, safety, & potential cost savings by using the [facility]? No    3. Are you open to using the [facility] (Type [facility]. No    4. Which pharmacy/location (including street and city if local pharmacy) is medication to be sent to?[id number] - [place], [place] - [address] [place] [address]    5. Do they need a 30 day or 90 day supply? 90 day   "

## 2024-09-16 NOTE — Telephone Encounter (Signed)
Defer to PCP.  Thanks MJP  

## 2024-11-25 ENCOUNTER — Other Ambulatory Visit (HOSPITAL_COMMUNITY)

## 2024-12-02 ENCOUNTER — Ambulatory Visit: Admitting: Cardiology

## 2025-04-01 ENCOUNTER — Encounter (INDEPENDENT_AMBULATORY_CARE_PROVIDER_SITE_OTHER): Admitting: Ophthalmology
